# Patient Record
Sex: Male | Born: 1935 | Race: White | Hispanic: No | Smoking: Former smoker
Health system: Southern US, Community
[De-identification: ages and names within clinical notes are randomized; demographics above are authoritative.]

## PROBLEM LIST (undated history)

## (undated) DIAGNOSIS — K219 Gastro-esophageal reflux disease without esophagitis: Secondary | ICD-10-CM

## (undated) DIAGNOSIS — E119 Type 2 diabetes mellitus without complications: Secondary | ICD-10-CM

## (undated) DIAGNOSIS — J449 Chronic obstructive pulmonary disease, unspecified: Secondary | ICD-10-CM

## (undated) DIAGNOSIS — I10 Essential (primary) hypertension: Secondary | ICD-10-CM

## (undated) DIAGNOSIS — E785 Hyperlipidemia, unspecified: Secondary | ICD-10-CM

## (undated) DIAGNOSIS — I350 Nonrheumatic aortic (valve) stenosis: Secondary | ICD-10-CM

## (undated) HISTORY — PX: KNEE SURGERY: SHX244

## (undated) HISTORY — PX: HYDROCELE EXCISION / REPAIR: SUR1145

## (undated) HISTORY — PX: INGUINAL HERNIA REPAIR: SUR1180

---

## 2005-09-29 ENCOUNTER — Ambulatory Visit: Payer: Self-pay | Admitting: Family Medicine

## 2006-03-21 ENCOUNTER — Other Ambulatory Visit: Payer: Self-pay

## 2006-03-21 ENCOUNTER — Emergency Department: Payer: Self-pay | Admitting: General Practice

## 2006-11-01 IMAGING — CR DG CHEST 1V PORT
1 series · 1 of 1 positions shown · non-contrast
Comparison: none

REASON FOR EXAM: Chest pain
COMMENTS:

[view not recorded]
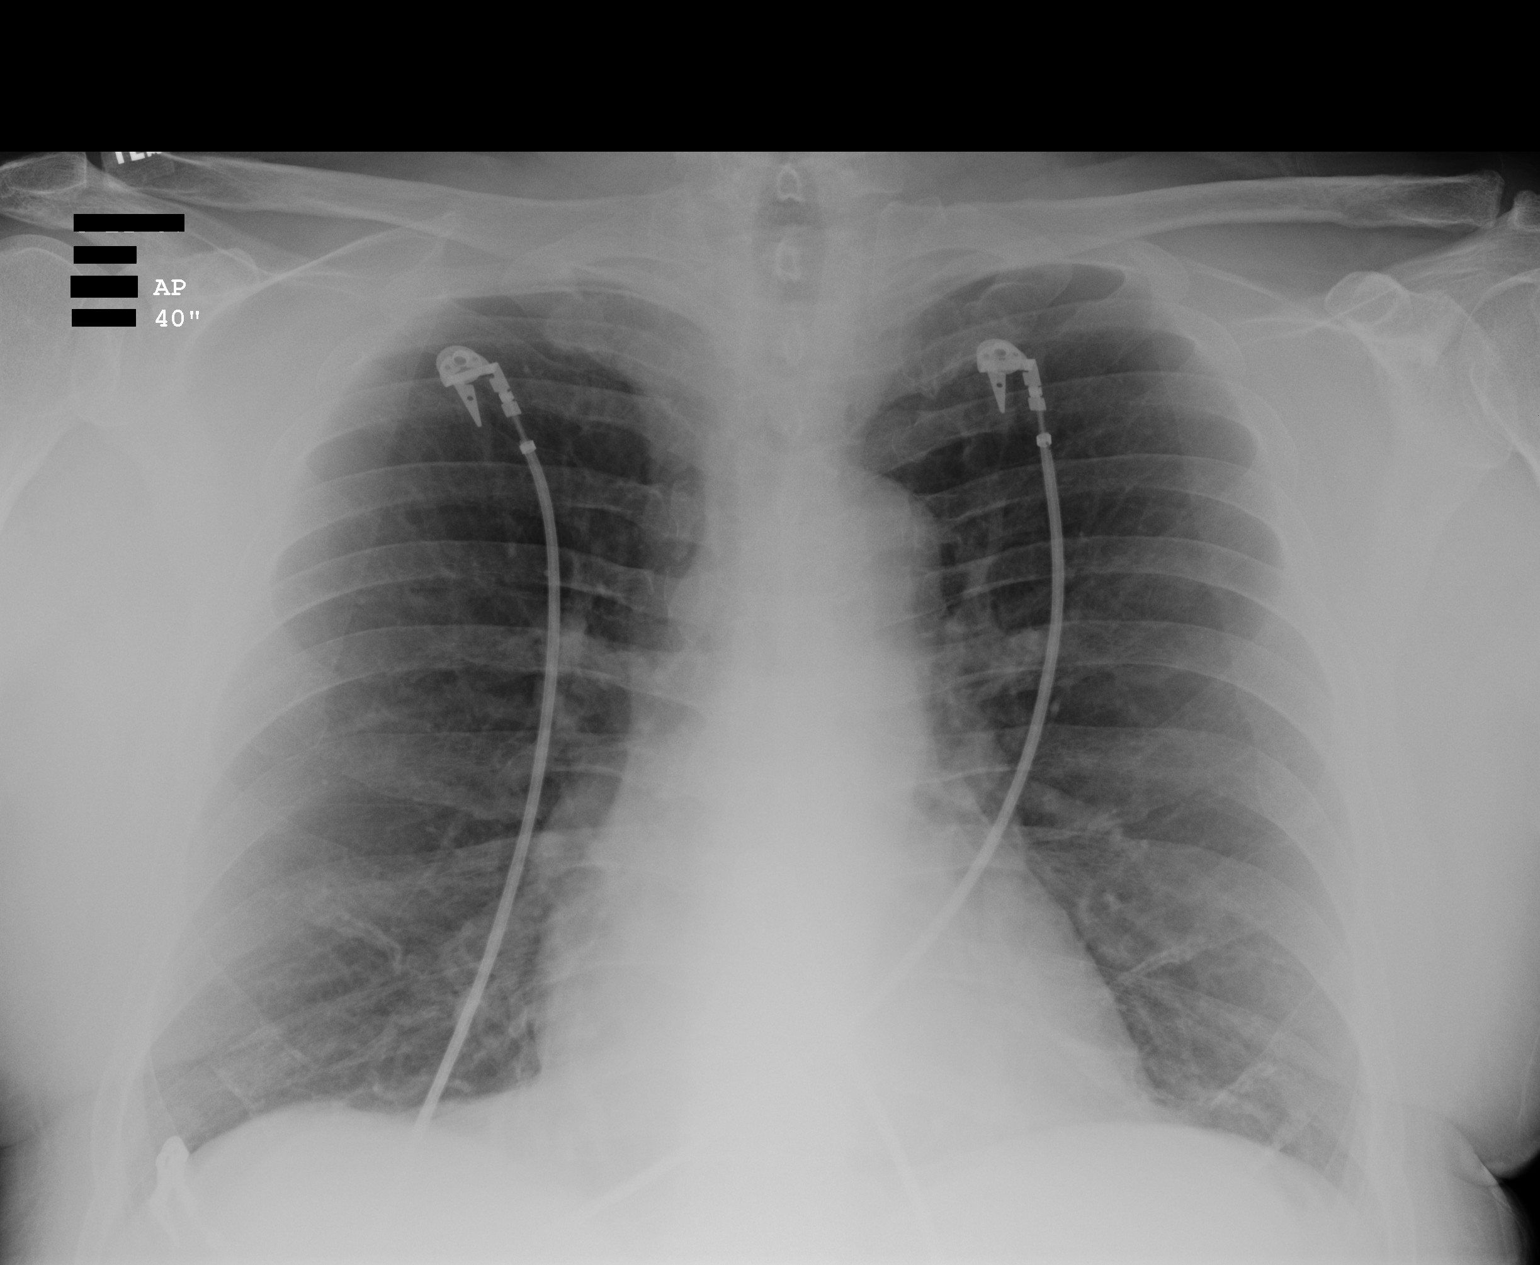

[1 of 1 positions shown; findings below may reference images not displayed]

PROCEDURE:     DXR - DXR PORTABLE CHEST SINGLE VIEW  - March 21, 2006  [DATE]

RESULT:          Comparison is made to a study 29 September, 2005.

The lungs are mildly hyperinflated.  There is no focal infiltrate.  The
heart is not enlarged.  The pulmonary vascularity is not engorged.  There is
no pleural effusion.
IMPRESSION: There is pulmonary hyperinflation.  I do not see
evidence of pneumonia.  A followup PA and lateral chest x-ray would be of
value.

## 2007-01-20 ENCOUNTER — Other Ambulatory Visit: Payer: Self-pay

## 2007-01-20 ENCOUNTER — Emergency Department: Payer: Self-pay | Admitting: Emergency Medicine

## 2007-12-23 DIAGNOSIS — N200 Calculus of kidney: Secondary | ICD-10-CM | POA: Insufficient documentation

## 2007-12-23 DIAGNOSIS — K219 Gastro-esophageal reflux disease without esophagitis: Secondary | ICD-10-CM | POA: Diagnosis present

## 2007-12-23 DIAGNOSIS — J449 Chronic obstructive pulmonary disease, unspecified: Secondary | ICD-10-CM | POA: Diagnosis present

## 2007-12-23 DIAGNOSIS — F32A Depression, unspecified: Secondary | ICD-10-CM | POA: Insufficient documentation

## 2007-12-23 DIAGNOSIS — Z86010 Personal history of colonic polyps: Secondary | ICD-10-CM | POA: Insufficient documentation

## 2007-12-23 DIAGNOSIS — H40009 Preglaucoma, unspecified, unspecified eye: Secondary | ICD-10-CM | POA: Insufficient documentation

## 2007-12-23 DIAGNOSIS — F419 Anxiety disorder, unspecified: Secondary | ICD-10-CM | POA: Insufficient documentation

## 2007-12-23 DIAGNOSIS — E119 Type 2 diabetes mellitus without complications: Secondary | ICD-10-CM

## 2007-12-23 DIAGNOSIS — N433 Hydrocele, unspecified: Secondary | ICD-10-CM | POA: Insufficient documentation

## 2007-12-23 DIAGNOSIS — E785 Hyperlipidemia, unspecified: Secondary | ICD-10-CM | POA: Diagnosis present

## 2008-01-28 DIAGNOSIS — D492 Neoplasm of unspecified behavior of bone, soft tissue, and skin: Secondary | ICD-10-CM | POA: Insufficient documentation

## 2009-02-07 DIAGNOSIS — M25519 Pain in unspecified shoulder: Secondary | ICD-10-CM | POA: Insufficient documentation

## 2009-02-07 DIAGNOSIS — M6020 Foreign body granuloma of soft tissue, not elsewhere classified, unspecified site: Secondary | ICD-10-CM | POA: Insufficient documentation

## 2010-11-14 DIAGNOSIS — M353 Polymyalgia rheumatica: Secondary | ICD-10-CM | POA: Insufficient documentation

## 2010-11-14 DIAGNOSIS — H259 Unspecified age-related cataract: Secondary | ICD-10-CM | POA: Insufficient documentation

## 2011-02-19 DIAGNOSIS — R490 Dysphonia: Secondary | ICD-10-CM | POA: Insufficient documentation

## 2011-12-09 ENCOUNTER — Emergency Department: Payer: Self-pay | Admitting: *Deleted

## 2011-12-09 LAB — COMPREHENSIVE METABOLIC PANEL
Albumin: 4.2 g/dL (ref 3.4–5.0)
Alkaline Phosphatase: 61 U/L (ref 50–136)
Anion Gap: 8 (ref 7–16)
BUN: 20 mg/dL — ABNORMAL HIGH (ref 7–18)
Calcium, Total: 10 mg/dL (ref 8.5–10.1)
Co2: 33 mmol/L — ABNORMAL HIGH (ref 21–32)
EGFR (African American): >60
EGFR (Non-African Amer.): >60
Glucose: 248 mg/dL — ABNORMAL HIGH (ref 65–99)
Osmolality: 290 (ref 275–301)
Potassium: 4.5 mmol/L (ref 3.5–5.1)
SGOT(AST): 32 U/L (ref 15–37)
SGPT (ALT): 33 U/L
Sodium: 140 mmol/L (ref 136–145)
Total Protein: 7.4 g/dL (ref 6.4–8.2)

## 2011-12-09 LAB — URINALYSIS, COMPLETE
Blood: NEGATIVE
Glucose,UR: >=500 mg/dL (ref 0–75)
Ketone: NEGATIVE
Leukocyte Esterase: NEGATIVE
Nitrite: NEGATIVE
Ph: 7 (ref 4.5–8.0)
RBC,UR: 1 /HPF (ref 0–5)

## 2011-12-09 LAB — CK TOTAL AND CKMB (NOT AT ARMC)
CK, Total: 81 U/L (ref 35–232)
CK-MB: 2.6 ng/mL (ref 0.5–3.6)

## 2011-12-09 LAB — CBC
HCT: 41.1 % (ref 40.0–52.0)
Platelet: 180 10*3/uL (ref 150–440)
RDW: 13.1 % (ref 11.5–14.5)
WBC: 7.9 10*3/uL (ref 3.8–10.6)

## 2011-12-12 ENCOUNTER — Inpatient Hospital Stay: Payer: Self-pay | Admitting: Surgery

## 2011-12-12 LAB — CBC WITH DIFFERENTIAL/PLATELET
Basophil #: 0 10*3/uL (ref 0.0–0.1)
HCT: 38.5 % — ABNORMAL LOW (ref 40.0–52.0)
Lymphocyte #: 1.6 10*3/uL (ref 1.0–3.6)
MCV: 90 fL (ref 80–100)
Monocyte #: 0.4 10*3/uL (ref 0.0–0.7)
Monocyte %: 7.3 %
Neutrophil #: 3.6 10*3/uL (ref 1.4–6.5)
Neutrophil %: 63.3 %
RDW: 13.1 % (ref 11.5–14.5)
WBC: 5.7 10*3/uL (ref 3.8–10.6)

## 2011-12-12 LAB — BASIC METABOLIC PANEL
Anion Gap: 8 (ref 7–16)
BUN: 17 mg/dL (ref 7–18)
Calcium, Total: 9.7 mg/dL (ref 8.5–10.1)
EGFR (Non-African Amer.): >60
Glucose: 134 mg/dL — ABNORMAL HIGH (ref 65–99)
Osmolality: 283 (ref 275–301)

## 2011-12-17 LAB — PATHOLOGY REPORT

## 2012-03-31 DIAGNOSIS — Z87891 Personal history of nicotine dependence: Secondary | ICD-10-CM | POA: Insufficient documentation

## 2012-03-31 DIAGNOSIS — I10 Essential (primary) hypertension: Secondary | ICD-10-CM | POA: Diagnosis present

## 2012-12-21 DIAGNOSIS — J209 Acute bronchitis, unspecified: Secondary | ICD-10-CM | POA: Insufficient documentation

## 2013-06-09 DIAGNOSIS — Z0000 Encounter for general adult medical examination without abnormal findings: Secondary | ICD-10-CM | POA: Insufficient documentation

## 2013-10-06 DIAGNOSIS — E1142 Type 2 diabetes mellitus with diabetic polyneuropathy: Secondary | ICD-10-CM | POA: Insufficient documentation

## 2014-06-22 DIAGNOSIS — N50819 Testicular pain, unspecified: Secondary | ICD-10-CM | POA: Insufficient documentation

## 2014-06-22 DIAGNOSIS — F17200 Nicotine dependence, unspecified, uncomplicated: Secondary | ICD-10-CM | POA: Insufficient documentation

## 2014-12-11 DIAGNOSIS — R350 Frequency of micturition: Secondary | ICD-10-CM | POA: Insufficient documentation

## 2015-02-14 DIAGNOSIS — M79606 Pain in leg, unspecified: Secondary | ICD-10-CM | POA: Insufficient documentation

## 2015-03-25 NOTE — Discharge Summary (Signed)
PATIENT NAME:  Francisco Gibbs, Francisco Gibbs MR#:  [id number] DATE OF BIRTH:  Nov 18, 1933  DATE OF ADMISSION:  12/12/2011 DATE OF DISCHARGE:  12/15/2011  FINAL DIAGNOSES:  1. Incarcerated right inguinal hernia.  2. Chronic left hydrocele.   PRINCIPAL PROCEDURES:  1. Repair of right inguinal hernia.  2. Repair and resection of left hydrocele.   HOSPITAL COURSE: The patient was admitted directly from the office as he had an incarcerated right inguinal hernia. Dr. Leanora Cover, my associate, was able to reduce the hernia later that evening as it was backed up in the operating room schedule. Due to OR unavailability on January 12th, the patient was scheduled for surgery on the morning of the 13th at which point a primary inguinal hernia repair utilizing a Shouldice technique was used and a hydrocele in the left side was resected and repaired. Postoperatively the patient did very well and on postoperative day #1 he was deemed suitable and stable for discharge.   DISCHARGE MEDICATIONS:  1. Metformin 500 mg by mouth once a day. 2. Lipitor 20 mg by mouth once a day. 3. Quinapril 20 mg by mouth once a day. 4. Spiriva one inhaled dose per day. 5. Aspirin 81 mg by mouth once a day. 6. Advair Diskus 5/500 one puff b.i.d.  7. ProAir HFA once a day.  8. Omeprazole 20 mg delayed-release tablet 1 tablet by mouth once a day.  9. Norco 5/325 1 to 2 tabs p.o. q.4 to 6 hours p.r.n. pain.   FOLLOW-UP: Follow-up was scheduled on 12/25/2011 at 10:30 a.m. in the [facility] office. He is to call if any questions or concerns, fever greater than 101.   DISCHARGE INSTRUCTIONS:  1. No heavy lifting. 2. Do not drive.  3. No alcohol or driving while taking pain medications. 4. Call with any questions or concerns to the office.   ____________________________ Jeannette How. Marina Gravel, MD mab:drc D: 12/29/2011 21:45:45 ET T: 12/30/2011 11:11:06 ET JOB#: [id number]  cc: Elta Guadeloupe A. Marina Gravel, MD, <Dictator> Aanika Defoor A Karah Caruthers MD ELECTRONICALLY SIGNED 12/31/2011  8:30

## 2015-03-25 NOTE — Op Note (Signed)
PATIENT NAME:  Francisco Gibbs, Francisco Gibbs MR#:  [id number] DATE OF BIRTH:  1933-01-27  DATE OF PROCEDURE:  12/14/2011  PREOPERATIVE DIAGNOSES: Right inguinal hernia, left hydrocele.   POSTOPERATIVE DIAGNOSES: Right inguinal hernia, left hydrocele.  PROCEDURES PERFORMED:  1. Shouldice repair of right inguinal hernia.  2. Resection and repair of left hydrocele.   SURGEON: Breena Bevacqua A. Marina Gravel, M.D.   ASSISTANT: Surgical scrub technologist.   TYPE OF ANESTHESIA: General endotracheal - Dr. Phineas Douglas and associates.   DESCRIPTION OF PROCEDURE: With the patient in the supine position, general endotracheal anesthesia was induced. The right groin was sterilely clipped of hair and prepped and draped with Betadine, and the scrotum with Betadine. A time out procedure was observed. A curvilinear incision was fashioned in the right groin above the inguinal ligament with scalpel and carried through Scarpa's fascia with electrocautery. A small bridging vein was divided between clamps and ties of 2-0 Vicryl. The external oblique aponeurosis was then identified. It was incised along its fibers laterally and carried through the external ring. Spermatic cord was encircled with Penrose drain at the pubic tubercle and elevated. Dissection of the cord demonstrated a large indirect inguinal hernia sac which was doubly ligated at its base with 2-0 silk suture and additionally ligated with 0 silk suture. It was retracted back into the abdomen. A Shouldice repair was then performed. The floor of the inguinal canal was then opened. It was then closed back over to itself in multiple layers with 2-0 PDS in running fashion starting medially, extending laterally to the ring and then from lateral to medial again. The conjoined tendon was then reapproximated to the shelving edge of the inguinal ligament tightening the internal ring thus by doing this. Local anesthesia was infiltrated. Hemostasis appeared to be adequate. The external oblique aponeurosis was  then closed utilizing 2-0 Vicryl. Scarpa's fascia was reapproximated utilizing 2-0 Vicryl. Skin edges were reapproximated utilizing running 4-0 Vicryl subcuticular, benzoin, Steri-Strips, Telfa, and Tegaderm.   Attention was then turned to the left scrotum. A transversely oriented scrotal incision was fashioned with scalpel and carried through the skin and dartos muscle with electrocautery. The external spermatic fascia and peritoneal layer of tunica vaginalis was then opened. The entire testicle with hydrocele was then extruded from the scrotal sac. The hydrocele was drained of clear fluid and opened in a vertical fashion with electrocautery and scissors. The hydrocele sac was then resected back with electrocautery. The edges of the hydrocele sac were then tacked posteriorly to each other with a 2-0 chromic suture. The testes was then inserted back into the scrotal canal. Hemostasis appeared to be adequate. The dartos muscle and the cremasteric fibers were then reapproximated in three layers with 2-0 chromic, 3-0 chromic, and 4-0 chromic subcuticular. Dermabond was then applied. Fluffs and surgical scrotal support was then applied. The patient was then subsequently extubated and taken to the recovery room in stable and satisfactory condition by anesthesia. ____________________________ Jeannette How. Marina Gravel, MD mab:slb D: 12/14/2011 15:47:21 ET T: 12/14/2011 16:05:16 ET JOB#: [id number]  cc: Elta Guadeloupe A. Marina Gravel, MD, <Dictator> Gradie Ohm A Macy Polio MD ELECTRONICALLY SIGNED 12/19/2011 11:12

## 2015-05-02 DIAGNOSIS — J309 Allergic rhinitis, unspecified: Secondary | ICD-10-CM | POA: Insufficient documentation

## 2016-03-27 DIAGNOSIS — K5900 Constipation, unspecified: Secondary | ICD-10-CM | POA: Insufficient documentation

## 2016-08-19 DIAGNOSIS — R32 Unspecified urinary incontinence: Secondary | ICD-10-CM | POA: Insufficient documentation

## 2017-02-05 DIAGNOSIS — R600 Localized edema: Secondary | ICD-10-CM | POA: Insufficient documentation

## 2017-06-01 DIAGNOSIS — R634 Abnormal weight loss: Secondary | ICD-10-CM | POA: Insufficient documentation

## 2017-10-08 DIAGNOSIS — M545 Low back pain, unspecified: Secondary | ICD-10-CM | POA: Insufficient documentation

## 2017-10-14 ENCOUNTER — Other Ambulatory Visit: Payer: Self-pay | Admitting: Family Medicine

## 2017-10-14 DIAGNOSIS — R634 Abnormal weight loss: Secondary | ICD-10-CM

## 2017-10-14 DIAGNOSIS — F17200 Nicotine dependence, unspecified, uncomplicated: Secondary | ICD-10-CM

## 2017-10-20 ENCOUNTER — Ambulatory Visit: Payer: Medicare Other

## 2017-10-27 ENCOUNTER — Other Ambulatory Visit: Payer: Self-pay | Admitting: Family Medicine

## 2017-10-28 ENCOUNTER — Ambulatory Visit
Admission: RE | Admit: 2017-10-28 | Discharge: 2017-10-28 | Disposition: A | Discharge status: Home / Self Care | Payer: Medicare Other | Source: Ambulatory Visit | Attending: Family Medicine | Admitting: Family Medicine

## 2017-10-28 DIAGNOSIS — R634 Abnormal weight loss: Secondary | ICD-10-CM | POA: Insufficient documentation

## 2017-10-28 DIAGNOSIS — F17210 Nicotine dependence, cigarettes, uncomplicated: Secondary | ICD-10-CM | POA: Diagnosis present

## 2017-10-28 DIAGNOSIS — E041 Nontoxic single thyroid nodule: Secondary | ICD-10-CM | POA: Diagnosis not present

## 2017-10-28 DIAGNOSIS — F17200 Nicotine dependence, unspecified, uncomplicated: Secondary | ICD-10-CM

## 2017-10-28 DIAGNOSIS — J439 Emphysema, unspecified: Secondary | ICD-10-CM | POA: Insufficient documentation

## 2017-10-28 DIAGNOSIS — I700 Atherosclerosis of aorta: Secondary | ICD-10-CM | POA: Insufficient documentation

## 2017-10-28 HISTORY — DX: Type 2 diabetes mellitus without complications: E11.9

## 2017-10-28 HISTORY — DX: Essential (primary) hypertension: I10

## 2017-10-28 LAB — POCT I-STAT CREATININE: Creatinine, Ser: 0.9 mg/dL (ref 0.61–1.24)

## 2017-10-28 MED ORDER — IOPAMIDOL (ISOVUE-300) INJECTION 61%
75.0000 mL | Freq: Once | INTRAVENOUS | Status: AC | PRN
  Administered 2017-10-28: 75 mL via INTRAVENOUS

## 2017-11-02 DIAGNOSIS — E041 Nontoxic single thyroid nodule: Secondary | ICD-10-CM | POA: Insufficient documentation

## 2017-11-10 ENCOUNTER — Other Ambulatory Visit: Payer: Self-pay | Admitting: Family Medicine

## 2017-11-10 DIAGNOSIS — E041 Nontoxic single thyroid nodule: Secondary | ICD-10-CM

## 2017-11-19 ENCOUNTER — Ambulatory Visit
Admission: RE | Admit: 2017-11-19 | Discharge: 2017-11-19 | Disposition: A | Discharge status: Home / Self Care | Payer: Medicare Other | Source: Ambulatory Visit | Attending: Family Medicine | Admitting: Family Medicine

## 2017-11-19 DIAGNOSIS — E041 Nontoxic single thyroid nodule: Secondary | ICD-10-CM | POA: Insufficient documentation

## 2018-01-25 ENCOUNTER — Ambulatory Visit
Admission: RE | Admit: 2018-01-25 | Discharge: 2018-01-25 | Disposition: A | Discharge status: Home / Self Care | Payer: Medicare Other | Source: Ambulatory Visit | Attending: Nurse Practitioner | Admitting: Nurse Practitioner

## 2018-01-25 ENCOUNTER — Other Ambulatory Visit: Payer: Self-pay | Admitting: Nurse Practitioner

## 2018-01-25 DIAGNOSIS — I700 Atherosclerosis of aorta: Secondary | ICD-10-CM | POA: Diagnosis not present

## 2018-01-25 DIAGNOSIS — J441 Chronic obstructive pulmonary disease with (acute) exacerbation: Secondary | ICD-10-CM | POA: Diagnosis present

## 2018-01-25 DIAGNOSIS — J40 Bronchitis, not specified as acute or chronic: Secondary | ICD-10-CM | POA: Insufficient documentation

## 2018-01-25 DIAGNOSIS — J449 Chronic obstructive pulmonary disease, unspecified: Secondary | ICD-10-CM

## 2018-03-07 IMAGING — US US THYROID
1 series · 13 of 25 positions shown · non-contrast
Comparison: 10/28/2017

CLINICAL DATA: Incidental on CT.

EXAM:
THYROID ULTRASOUND
TECHNIQUE: Ultrasound examination of the thyroid gland and adjacent soft
tissues was performed.

[Series 1: us thyroid · 0.08mm/px · 13 of 76 slices shown]
[im 1/76]
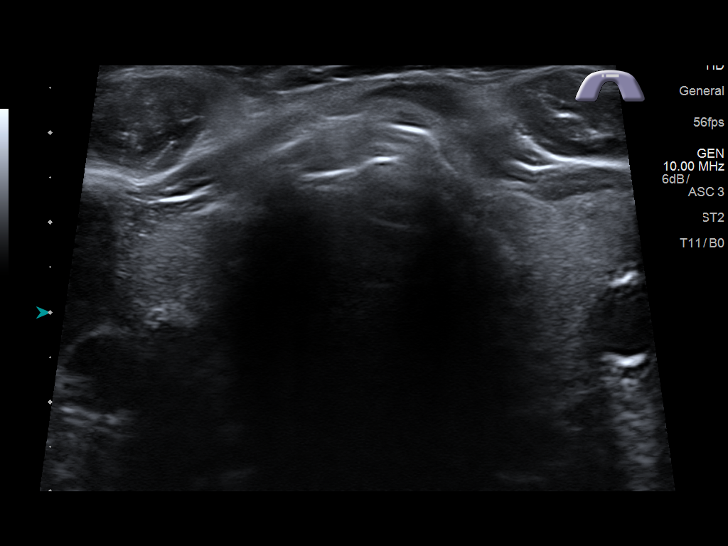
[im 7/76]
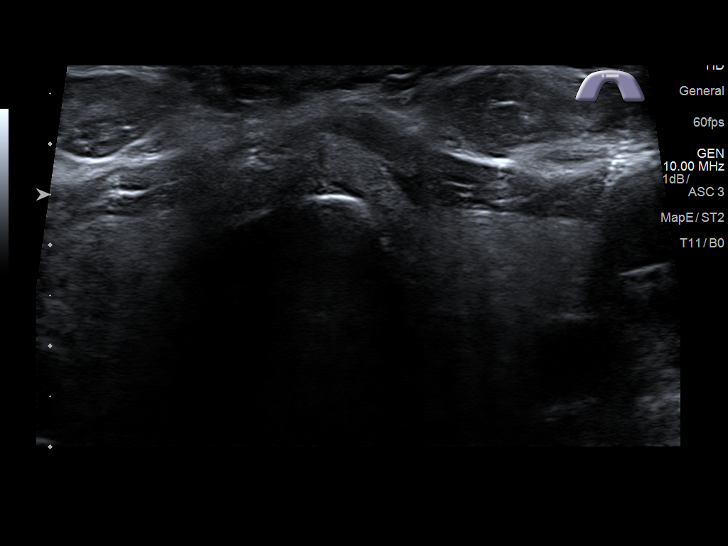
[im 13/76]
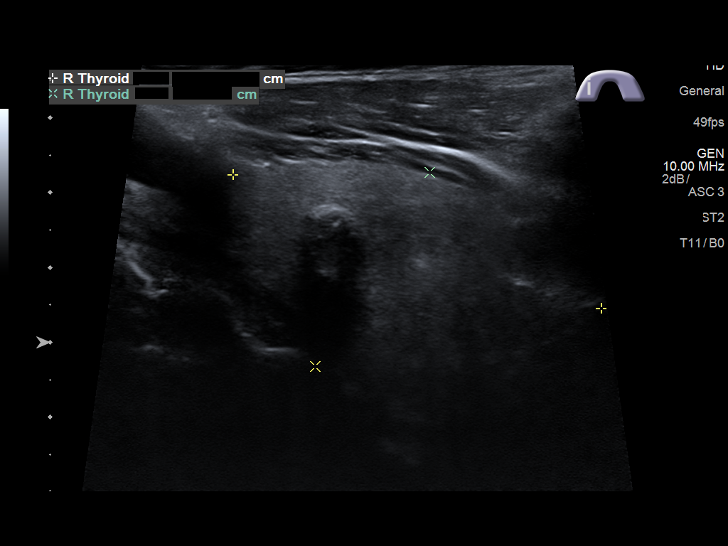
[im 19/76]
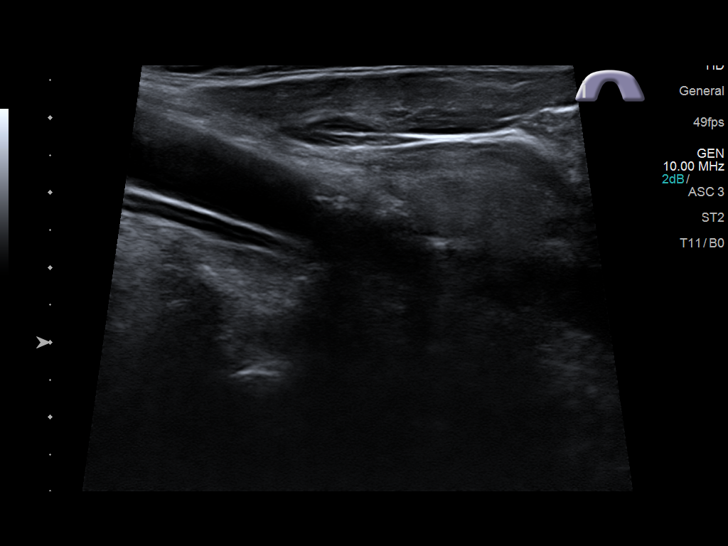
[im 26/76]
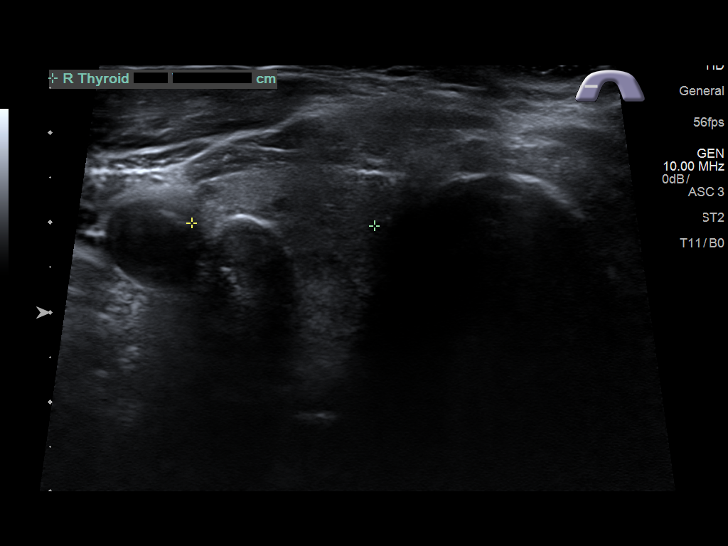
[im 32/76]
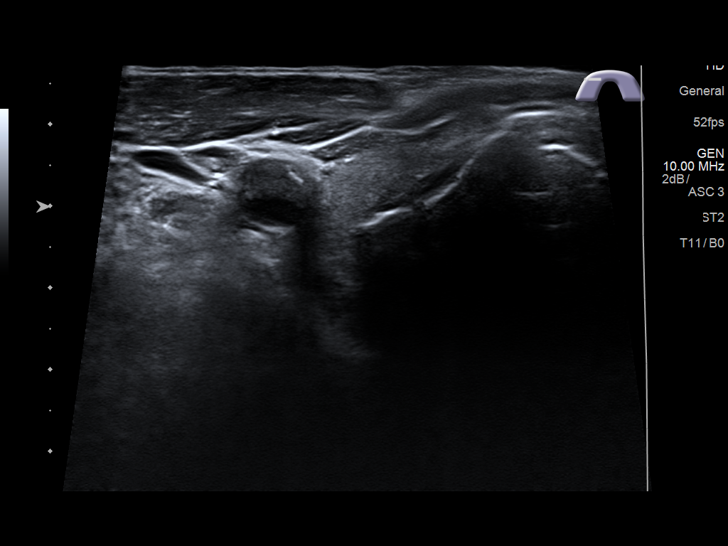
[im 38/76]
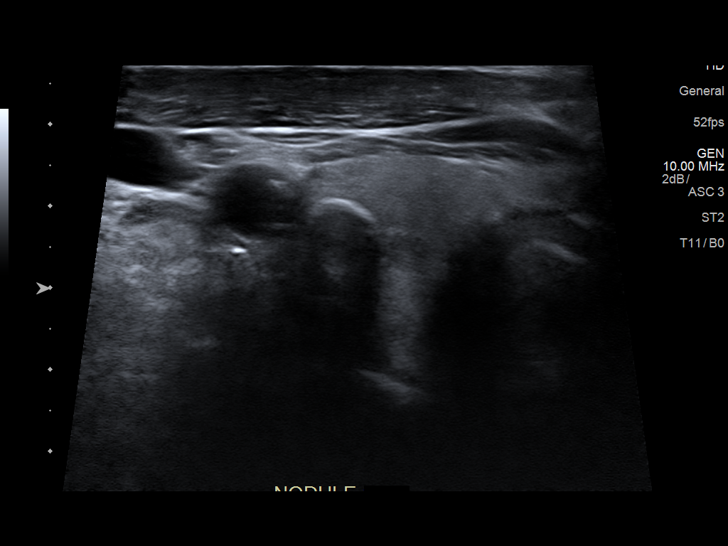
[im 44/76]
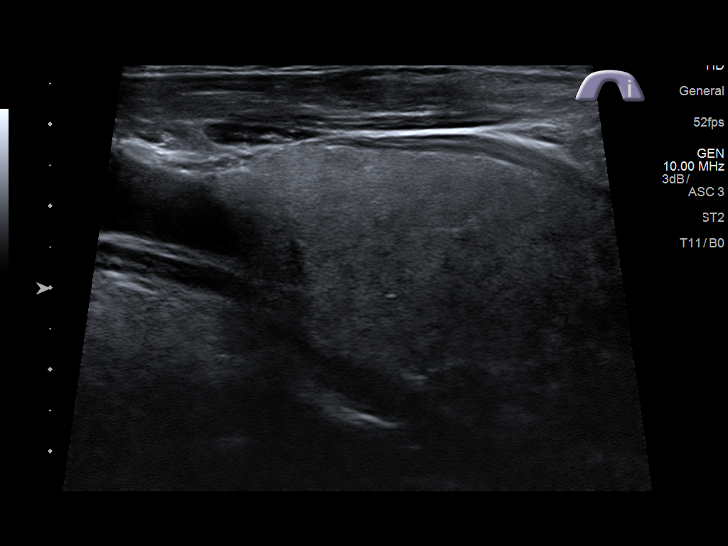
[im 51/76]
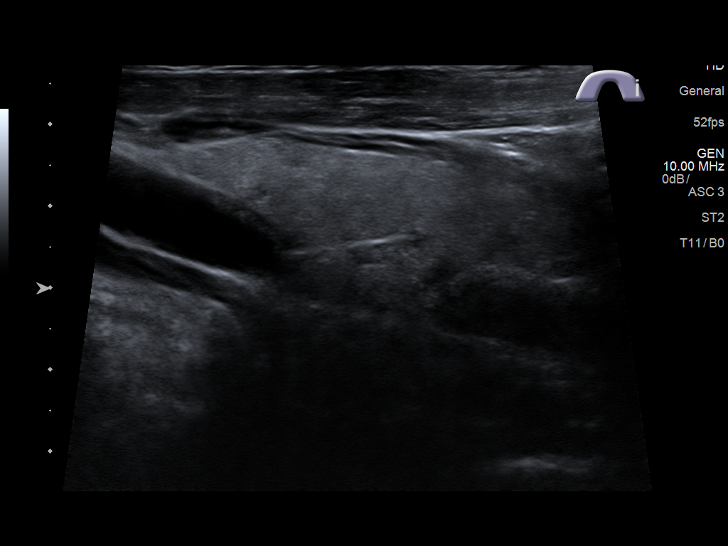
[im 57/76]
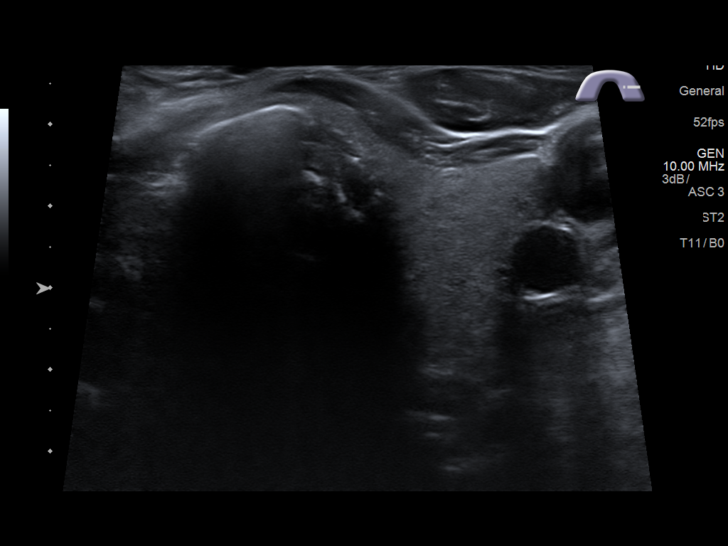
[im 63/76]
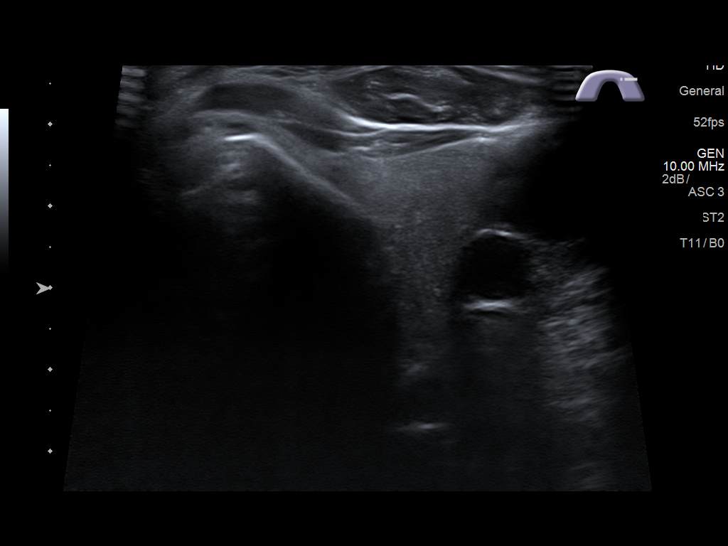
[im 69/76]
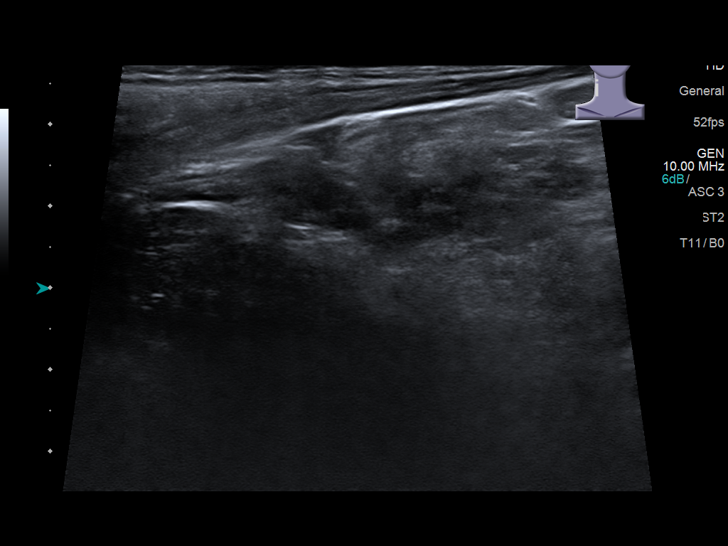
[im 76/76]
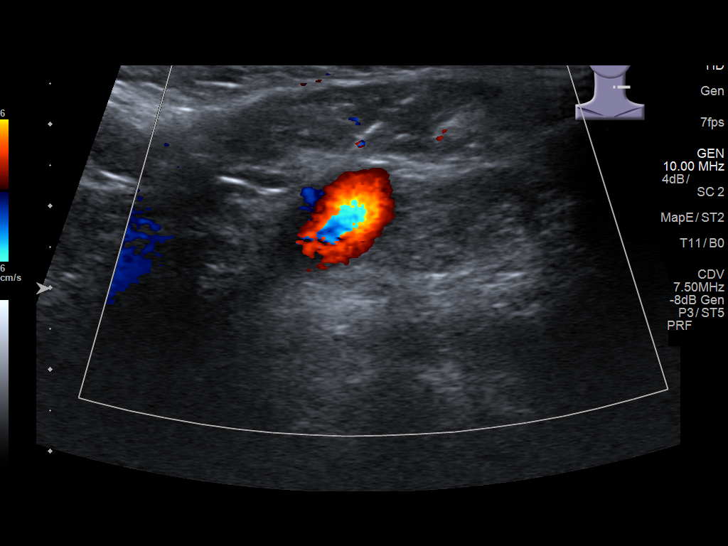

[13 of 25 positions shown; findings below may reference images not displayed]

FINDINGS: Parenchymal Echotexture: Mildly heterogenous

Isthmus: 5 mm

Right lobe: 5.2 x 3.0 x 2.0 cm

Left lobe: 4.7 x 2.4 x 1.7 cm

_________________________________________________________

Estimated total number of nodules >/= 1 cm: 1

Number of spongiform nodules >/=  2 cm not described below (TR1): 0

Number of mixed cystic and solid nodules >/= 1.5 cm not described
below (TR2): 0

_________________________________________________________

Nodule # 1:

Location: Right; Inferior

Maximum size: 1.2 cm; Other 2 dimensions: 1.0 x 1.0 cm

Composition: solid/almost completely solid (2)

Echogenicity: isoechoic (1)

Shape: not taller-than-wide (0)

Margins: ill-defined (0)

Echogenic foci: peripheral calcifications (2)

ACR TI-RADS total points: 5.

ACR TI-RADS risk category: TR4 (4-6 points).

ACR TI-RADS recommendations:

*Given size (>/= 1 - 1.4 cm) and appearance, a follow-up ultrasound
in 1 year should be considered based on TI-RADS criteria.

_________________________________________________________
IMPRESSION: 1.2 cm right inferior TR 4 nodule correlates with the CT finding.
This meets criteria for follow-up in 1 year.

The above is in keeping with the ACR TI-RADS recommendations - [HOSPITAL] 4297;[DATE].

## 2018-03-18 DIAGNOSIS — R601 Generalized edema: Secondary | ICD-10-CM | POA: Insufficient documentation

## 2018-04-28 ENCOUNTER — Other Ambulatory Visit: Payer: Self-pay

## 2018-04-28 ENCOUNTER — Encounter: Payer: Self-pay | Admitting: Emergency Medicine

## 2018-04-28 ENCOUNTER — Inpatient Hospital Stay
Admission: EM | Admit: 2018-04-28 | Discharge: 2018-04-29 | DRG: 641 | Disposition: A | Discharge status: Home / Self Care | Payer: Medicare Other | Attending: Internal Medicine | Admitting: Internal Medicine

## 2018-04-28 DIAGNOSIS — F419 Anxiety disorder, unspecified: Secondary | ICD-10-CM | POA: Diagnosis not present

## 2018-04-28 DIAGNOSIS — I441 Atrioventricular block, second degree: Secondary | ICD-10-CM | POA: Diagnosis not present

## 2018-04-28 DIAGNOSIS — I10 Essential (primary) hypertension: Secondary | ICD-10-CM | POA: Diagnosis not present

## 2018-04-28 DIAGNOSIS — F17210 Nicotine dependence, cigarettes, uncomplicated: Secondary | ICD-10-CM | POA: Diagnosis not present

## 2018-04-28 DIAGNOSIS — Y92009 Unspecified place in unspecified non-institutional (private) residence as the place of occurrence of the external cause: Secondary | ICD-10-CM

## 2018-04-28 DIAGNOSIS — R001 Bradycardia, unspecified: Secondary | ICD-10-CM | POA: Diagnosis present

## 2018-04-28 DIAGNOSIS — J449 Chronic obstructive pulmonary disease, unspecified: Secondary | ICD-10-CM | POA: Diagnosis not present

## 2018-04-28 DIAGNOSIS — R600 Localized edema: Secondary | ICD-10-CM | POA: Diagnosis present

## 2018-04-28 DIAGNOSIS — T502X5A Adverse effect of carbonic-anhydrase inhibitors, benzothiadiazides and other diuretics, initial encounter: Secondary | ICD-10-CM | POA: Diagnosis present

## 2018-04-28 DIAGNOSIS — I4891 Unspecified atrial fibrillation: Secondary | ICD-10-CM | POA: Diagnosis present

## 2018-04-28 DIAGNOSIS — E119 Type 2 diabetes mellitus without complications: Secondary | ICD-10-CM | POA: Diagnosis not present

## 2018-04-28 LAB — CBC WITH DIFFERENTIAL/PLATELET
BASOS ABS: 0 10*3/uL (ref 0–0.1)
BASOS PCT: 1 %
EOS ABS: 0.1 10*3/uL (ref 0–0.7)
EOS PCT: 1 %
HCT: 32 % — ABNORMAL LOW (ref 40.0–52.0)
Hemoglobin: 10.6 g/dL — ABNORMAL LOW (ref 13.0–18.0)
Lymphocytes Relative: 36 %
Lymphs Abs: 1.9 10*3/uL (ref 1.0–3.6)
MCH: 29.5 pg (ref 26.0–34.0)
MCHC: 33.2 g/dL (ref 32.0–36.0)
MCV: 88.9 fL (ref 80.0–100.0)
MONO ABS: 0.5 10*3/uL (ref 0.2–1.0)
Monocytes Relative: 10 %
Neutro Abs: 2.7 10*3/uL (ref 1.4–6.5)
Neutrophils Relative %: 52 %
PLATELETS: 134 10*3/uL — AB (ref 150–440)
RBC: 3.6 MIL/uL — ABNORMAL LOW (ref 4.40–5.90)
RDW: 14.2 % (ref 11.5–14.5)
WBC: 5.3 10*3/uL (ref 3.8–10.6)

## 2018-04-28 LAB — URINALYSIS, COMPLETE (UACMP) WITH MICROSCOPIC
BILIRUBIN URINE: NEGATIVE
GLUCOSE, UA: NEGATIVE mg/dL
KETONES UR: NEGATIVE mg/dL
LEUKOCYTES UA: NEGATIVE
NITRITE: NEGATIVE
PH: 5 (ref 5.0–8.0)
Protein, ur: NEGATIVE mg/dL
SPECIFIC GRAVITY, URINE: 1.006 (ref 1.005–1.030)
Squamous Epithelial / LPF: NONE SEEN (ref 0–5)
WBC, UA: NONE SEEN WBC/hpf (ref 0–5)

## 2018-04-28 LAB — COMPREHENSIVE METABOLIC PANEL
ALBUMIN: 4 g/dL (ref 3.5–5.0)
ALK PHOS: 52 U/L (ref 38–126)
ALT: 19 U/L (ref 17–63)
AST: 29 U/L (ref 15–41)
Anion gap: 9 (ref 5–15)
BILIRUBIN TOTAL: 0.5 mg/dL (ref 0.3–1.2)
BUN: 23 mg/dL — ABNORMAL HIGH (ref 6–20)
CALCIUM: 7.4 mg/dL — AB (ref 8.9–10.3)
CO2: 26 mmol/L (ref 22–32)
CREATININE: 1.05 mg/dL (ref 0.61–1.24)
Chloride: 104 mmol/L (ref 101–111)
GFR calc Af Amer: >60 mL/min (ref >60)
GFR calc non Af Amer: >60 mL/min (ref >60)
GLUCOSE: 116 mg/dL — AB (ref 65–99)
Potassium: 3.9 mmol/L (ref 3.5–5.1)
Sodium: 139 mmol/L (ref 135–145)
TOTAL PROTEIN: 6.5 g/dL (ref 6.5–8.1)

## 2018-04-28 LAB — GLUCOSE, CAPILLARY: Glucose-Capillary: 116 mg/dL — ABNORMAL HIGH (ref 65–99)

## 2018-04-28 LAB — MAGNESIUM: Magnesium: 0.4 mg/dL — CL (ref 1.7–2.4)

## 2018-04-28 MED ORDER — ONDANSETRON HCL 4 MG PO TABS: 4.0000 mg | ORAL_TABLET | Freq: Four times a day (QID) | ORAL | Status: DC | PRN

## 2018-04-28 MED ORDER — QUINAPRIL HCL 10 MG PO TABS
40.0000 mg | ORAL_TABLET | Freq: Every day | ORAL | Status: DC
  Administered 2018-04-29: 40 mg via ORAL
  Filled 2018-04-28 (×2): qty 4

## 2018-04-28 MED ORDER — SODIUM CHLORIDE 0.9% FLUSH
3.0000 mL | Freq: Two times a day (BID) | INTRAVENOUS | Status: DC
  Administered 2018-04-28: 3 mL via INTRAVENOUS

## 2018-04-28 MED ORDER — TERAZOSIN HCL 1 MG PO CAPS
1.0000 mg | ORAL_CAPSULE | Freq: Every day | ORAL | Status: DC
  Administered 2018-04-28 – 2018-04-29 (×2): 1 mg via ORAL
  Filled 2018-04-28 (×5): qty 1

## 2018-04-28 MED ORDER — TIOTROPIUM BROMIDE MONOHYDRATE 18 MCG IN CAPS
1.0000 | ORAL_CAPSULE | Freq: Every day | RESPIRATORY_TRACT | Status: DC
  Administered 2018-04-28 – 2018-04-29 (×2): 18 ug via RESPIRATORY_TRACT
  Filled 2018-04-28: qty 5

## 2018-04-28 MED ORDER — MAGNESIUM SULFATE 2 GM/50ML IV SOLN
2.0000 g | Freq: Once | INTRAVENOUS | Status: AC
  Administered 2018-04-28: 2 g via INTRAVENOUS
  Filled 2018-04-28: qty 50

## 2018-04-28 MED ORDER — DOCUSATE SODIUM 100 MG PO CAPS
100.0000 mg | ORAL_CAPSULE | Freq: Two times a day (BID) | ORAL | Status: DC
Start: 2018-04-28 — End: 2018-04-29
  Filled 2018-04-28 (×2): qty 1

## 2018-04-28 MED ORDER — LOSARTAN POTASSIUM 50 MG PO TABS
50.0000 mg | ORAL_TABLET | Freq: Every day | ORAL | Status: DC
  Administered 2018-04-29: 50 mg via ORAL
  Filled 2018-04-28: qty 1

## 2018-04-28 MED ORDER — MAGNESIUM CHLORIDE 64 MG PO TBEC
2.0000 | DELAYED_RELEASE_TABLET | Freq: Every day | ORAL | Status: DC
  Administered 2018-04-28: 128 mg via ORAL
  Filled 2018-04-28 (×2): qty 2

## 2018-04-28 MED ORDER — ONDANSETRON HCL 4 MG/2ML IJ SOLN: 4.0000 mg | Freq: Four times a day (QID) | INTRAMUSCULAR | Status: DC | PRN

## 2018-04-28 MED ORDER — CLORAZEPATE DIPOTASSIUM 7.5 MG PO TABS
7.5000 mg | ORAL_TABLET | Freq: Every day | ORAL | Status: DC
  Administered 2018-04-29: 7.5 mg via ORAL
  Filled 2018-04-28 (×2): qty 1

## 2018-04-28 MED ORDER — ATORVASTATIN CALCIUM 20 MG PO TABS
80.0000 mg | ORAL_TABLET | Freq: Every day | ORAL | Status: DC
  Administered 2018-04-28 – 2018-04-29 (×2): 80 mg via ORAL
  Filled 2018-04-28 (×2): qty 4

## 2018-04-28 MED ORDER — MAGNESIUM SULFATE 2 GM/50ML IV SOLN: 2.0000 g | Freq: Once | INTRAVENOUS | Status: DC

## 2018-04-28 MED ORDER — ACETAMINOPHEN 650 MG RE SUPP: 650.0000 mg | Freq: Four times a day (QID) | RECTAL | Status: DC | PRN

## 2018-04-28 MED ORDER — MAGNESIUM OXIDE 400 (241.3 MG) MG PO TABS
200.0000 mg | ORAL_TABLET | Freq: Every day | ORAL | Status: DC
  Administered 2018-04-28: 200 mg via ORAL
  Filled 2018-04-28 (×2): qty 1

## 2018-04-28 MED ORDER — FLUTICASONE PROPIONATE 50 MCG/ACT NA SUSP
1.0000 | Freq: Every day | NASAL | Status: DC | PRN
  Filled 2018-04-28: qty 16

## 2018-04-28 MED ORDER — GABAPENTIN 300 MG PO CAPS
300.0000 mg | ORAL_CAPSULE | Freq: Three times a day (TID) | ORAL | Status: DC
  Administered 2018-04-28 – 2018-04-29 (×3): 300 mg via ORAL
  Filled 2018-04-28 (×3): qty 1

## 2018-04-28 MED ORDER — ENOXAPARIN SODIUM 40 MG/0.4ML ~~LOC~~ SOLN
40.0000 mg | SUBCUTANEOUS | Status: DC
  Administered 2018-04-28: 40 mg via SUBCUTANEOUS
  Filled 2018-04-28: qty 0.4

## 2018-04-28 MED ORDER — FUROSEMIDE 40 MG PO TABS
40.0000 mg | ORAL_TABLET | Freq: Every day | ORAL | Status: DC
  Administered 2018-04-29: 40 mg via ORAL
  Filled 2018-04-28: qty 1

## 2018-04-28 MED ORDER — ACETAMINOPHEN 325 MG PO TABS: 650.0000 mg | ORAL_TABLET | Freq: Four times a day (QID) | ORAL | Status: DC | PRN

## 2018-04-28 MED ORDER — INSULIN ASPART 100 UNIT/ML ~~LOC~~ SOLN
0.0000 [IU] | Freq: Three times a day (TID) | SUBCUTANEOUS | Status: DC
  Administered 2018-04-29: 1 [IU] via SUBCUTANEOUS
  Filled 2018-04-28: qty 1

## 2018-04-28 MED ORDER — ASPIRIN 81 MG PO CHEW
81.0000 mg | CHEWABLE_TABLET | Freq: Every day | ORAL | Status: DC
  Administered 2018-04-29: 81 mg via ORAL
  Filled 2018-04-28: qty 1

## 2018-04-28 MED ORDER — METFORMIN HCL ER 500 MG PO TB24
1000.0000 mg | ORAL_TABLET | Freq: Two times a day (BID) | ORAL | Status: DC
  Administered 2018-04-28 – 2018-04-29 (×2): 1000 mg via ORAL
  Filled 2018-04-28 (×3): qty 2

## 2018-04-28 MED ORDER — PANTOPRAZOLE SODIUM 40 MG PO TBEC
40.0000 mg | DELAYED_RELEASE_TABLET | Freq: Every day | ORAL | Status: DC
  Administered 2018-04-29: 40 mg via ORAL
  Filled 2018-04-28: qty 1

## 2018-04-28 MED ORDER — BISACODYL 5 MG PO TBEC: 5.0000 mg | DELAYED_RELEASE_TABLET | Freq: Every day | ORAL | Status: DC | PRN

## 2018-04-28 NOTE — ED Notes (Signed)
Attempted to call report per Secretary they will call back shortly

## 2018-04-28 NOTE — Progress Notes (Signed)
Still waiting for one more medication from pharmacy. Will relay to night nurse to give once they tube it up. Have already called pharmacy for it. Will continue to monitor.

## 2018-04-28 NOTE — ED Notes (Signed)
Pt given meal tray ok per MD Quentin Cornwall

## 2018-04-28 NOTE — ED Provider Notes (Signed)
[facility] Emergency Department Provider Note    First MD Initiated Contact with Patient 04/28/18 1209     (approximate)  I have reviewed the triage vital signs and the nursing notes.   HISTORY  Chief Complaint Abnormal Lab    HPI Francisco Gibbs is a 84 y.o. male presents to the ER for abnormal magnesium and calcium levels.  Patient has been taking oral magnesium supplements for the past several weeks without improvement.  Has been also having frequent nonbloody non-melanotic stools that are worsened by the magnesium supplementation.  He does take Lasix as well as Protonix but is otherwise stating that he has been asymptomatic.  Denies any anorexia.  No nausea or vomiting.  No change in behavior.  Past Medical History:  Diagnosis Date  . Diabetes mellitus without complication ([place])    Pt takes Metformin.  Marland Kitchen Hypertension    No family history on file. Past Surgical History:  Procedure Laterality Date  . HYDROCELE EXCISION / REPAIR    . INGUINAL HERNIA REPAIR    . KNEE SURGERY     There are no active problems to display for this patient.     Prior to Admission medications   Not on File    Allergies Patient has no known allergies.    Social History Social History   Tobacco Use  . Smoking status: Former Research scientist (life sciences)  . Smokeless tobacco: Never Used  Substance Use Topics  . Alcohol use: Not Currently  . Drug use: Never    Review of Systems Patient denies headaches, rhinorrhea, blurry vision, numbness, shortness of breath, chest pain, edema, cough, abdominal pain, nausea, vomiting, diarrhea, dysuria, fevers, rashes or hallucinations unless otherwise stated above in HPI. ____________________________________________   PHYSICAL EXAM:  VITAL SIGNS: Vitals:   04/28/18 1245 04/28/18 1330  BP:    Pulse: (!) 26 (!) 36  Resp:  17  Temp:    SpO2: 96% 97%    Constitutional: Alert and oriented. Well appearing and in no acute distress. Eyes:  Conjunctivae are normal.  Head: Atraumatic. Nose: No congestion/rhinnorhea. Mouth/Throat: Mucous membranes are moist.   Neck: No stridor. Painless ROM.  Cardiovascular: Normal rate, regular rhythm. Grossly normal heart sounds.  Good peripheral circulation. Respiratory: Normal respiratory effort.  No retractions. Lungs CTAB. Gastrointestinal: Soft and nontender. No distention. No abdominal bruits. No CVA tenderness. Genitourinary:  Musculoskeletal: No lower extremity tenderness nor edema.  No joint effusions. Neurologic:  Normal speech and language. No gross focal neurologic deficits are appreciated. No facial droop.  No chovsteks sign, no hyperreflexia Skin:  Skin is warm, dry and intact. No rash noted. Psychiatric: Mood and affect are normal. Speech and behavior are normal.  ____________________________________________   LABS (all labs ordered are listed, but only abnormal results are displayed)  Results for orders placed or performed during the hospital encounter of 04/28/18 (from the past 24 hour(s))  Comprehensive metabolic panel     Status: Abnormal   Collection Time: 04/28/18 10:51 AM  Result Value Ref Range   Sodium 139 135 - 145 mmol/L   Potassium 3.9 3.5 - 5.1 mmol/L   Chloride 104 101 - 111 mmol/L   CO2 26 22 - 32 mmol/L   Glucose, Bld 116 (H) 65 - 99 mg/dL   BUN 23 (H) 6 - 20 mg/dL   Creatinine, Ser 1.05 0.61 - 1.24 mg/dL   Calcium 7.4 (L) 8.9 - 10.3 mg/dL   Total Protein 6.5 6.5 - 8.1 g/dL   Albumin  4.0 3.5 - 5.0 g/dL   AST 29 15 - 41 U/L   ALT 19 17 - 63 U/L   Alkaline Phosphatase 52 38 - 126 U/L   Total Bilirubin 0.5 0.3 - 1.2 mg/dL   GFR calc non Af Amer >60 >60 mL/min   GFR calc Af Amer >60 >60 mL/min   Anion gap 9 5 - 15  CBC with Differential     Status: Abnormal   Collection Time: 04/28/18 10:51 AM  Result Value Ref Range   WBC 5.3 3.8 - 10.6 K/uL   RBC 3.60 (L) 4.40 - 5.90 MIL/uL   Hemoglobin 10.6 (L) 13.0 - 18.0 g/dL   HCT 32.0 (L) 40.0 - 52.0 %    MCV 88.9 80.0 - 100.0 fL   MCH 29.5 26.0 - 34.0 pg   MCHC 33.2 32.0 - 36.0 g/dL   RDW 14.2 11.5 - 14.5 %   Platelets 134 (L) 150 - 440 K/uL   Neutrophils Relative % 52 %   Neutro Abs 2.7 1.4 - 6.5 K/uL   Lymphocytes Relative 36 %   Lymphs Abs 1.9 1.0 - 3.6 K/uL   Monocytes Relative 10 %   Monocytes Absolute 0.5 0.2 - 1.0 K/uL   Eosinophils Relative 1 %   Eosinophils Absolute 0.1 0 - 0.7 K/uL   Basophils Relative 1 %   Basophils Absolute 0.0 0 - 0.1 K/uL  Magnesium     Status: Abnormal   Collection Time: 04/28/18 12:09 PM  Result Value Ref Range   Magnesium 0.4 (LL) 1.7 - 2.4 mg/dL   ____________________________________________  EKG My review and personal interpretation at Time: 13:15 Indication: bradycardia  Rate: 60  Rhythm: sinus dysrhythmia versus mobitz type 1 Axis: normal Other: no stemi, nonspecific st abn ____________________________________________  RADIOLOGY   ____________________________________________   PROCEDURES  Procedure(s) performed:  .Critical Care Performed by: Merlyn Lot, MD Authorized by: Merlyn Lot, MD   Critical care provider statement:    Critical care time (minutes):  15   Critical care time was exclusive of:  Separately billable procedures and treating other patients   Critical care was necessary to treat or prevent imminent or life-threatening deterioration of the following conditions:  Metabolic crisis   Critical care was time spent personally by me on the following activities:  Development of treatment plan with patient or surrogate, discussions with consultants, evaluation of patient's response to treatment, examination of patient, obtaining history from patient or surrogate, ordering and performing treatments and interventions, ordering and review of laboratory studies, ordering and review of radiographic studies, pulse oximetry, re-evaluation of patient's condition and review of old charts      Critical Care performed:  yes ____________________________________________   INITIAL IMPRESSION / ASSESSMENT AND PLAN / ED COURSE  Pertinent labs & imaging results that were available during my care of the patient were reviewed by me and considered in my medical decision making (see chart for details).  DDX: electrolyte abn, dysrthmia, dehydration, medication effect, gi lossess  Francisco Gibbs is a 84 y.o. who presents to the ED with profound hypomagnesemia failing outpatient management.  I do suspect this is multifactorial as the patient is on Lasix as well as Protonix and has been having diarrhea for the past several days.  Patient also with evidence of bradycardia which is concerning for Mobitz type I.  No evidence of high-grade heart block at this point but given the concern for dysrhythmia patient will benefit from hospitalization for continued IV magnesium repletion that  I have started in the ER.  Have discussed with the patient and available family all diagnostics and treatments performed thus far and all questions were answered to the best of my ability. The patient demonstrates understanding and agreement with plan.       As part of my medical decision making, I reviewed the following data within the [place] notes reviewed and incorporated, Labs reviewed, notes from prior ED visits   ____________________________________________   FINAL CLINICAL IMPRESSION(S) / ED DIAGNOSES  Final diagnoses:  Hypomagnesemia syndrome      NEW MEDICATIONS STARTED DURING THIS VISIT:  New Prescriptions   No medications on file     Note:  This document was prepared using Dragon voice recognition software and may include unintentional dictation errors.    Merlyn Lot, MD 04/28/18 1346

## 2018-04-28 NOTE — ED Notes (Signed)
Pt up to toilet 

## 2018-04-28 NOTE — H&P (Signed)
[place] [facility] [place] NAME: Francisco Gibbs    MR#:  [id number]  DATE OF BIRTH:  November 30, 1933  DATE OF ADMISSION:  04/28/2018  PRIMARY CARE PHYSICIAN: Letta Median, MD   REQUESTING/REFERRING PHYSICIAN: Dr. Merlyn Lot  CHIEF COMPLAINT: Abnormal labs   Chief Complaint  Patient presents with  . Abnormal Lab    HISTORY OF PRESENT ILLNESS:  Francisco Gibbs  is a 83 y.o. male with a known history of essential hypertension, diabetes mellitus type 2 sent by PCP because of abnormal labs with magnesium of 0.4.  Started on magnesium supplements with PCP recently patient takes Lasix.,  Metoprolol.  Metformin at home.  PAST MEDICAL HISTORY:   Past Medical History:  Diagnosis Date  . Diabetes mellitus without complication ([place])    Pt takes Metformin.  Marland Kitchen Hypertension     PAST SURGICAL HISTOIRY:   Past Surgical History:  Procedure Laterality Date  . HYDROCELE EXCISION / REPAIR    . INGUINAL HERNIA REPAIR    . KNEE SURGERY      SOCIAL HISTORY:   Social History   Tobacco Use  . Smoking status: Former Research scientist (life sciences)  . Smokeless tobacco: Never Used  Substance Use Topics  . Alcohol use: Not Currently    FAMILY HISTORY:  No family history on file.  DRUG ALLERGIES:  No Known Allergies  REVIEW OF SYSTEMS:  CONSTITUTIONAL: No fever, fatigue or weakness.  EYES: No blurred or double vision.  EARS, NOSE, AND THROAT: No tinnitus or ear pain.  RESPIRATORY: No cough, shortness of breath, wheezing or hemoptysis.  CARDIOVASCULAR: No chest pain, orthopnea, edema.  GASTROINTESTINAL: No nausea, vomiting, diarrhea or abdominal pain.  GENITOURINARY: No dysuria, hematuria.  ENDOCRINE: No polyuria, nocturia,  HEMATOLOGY: No anemia, easy bruising or bleeding SKIN: No rash or lesion. MUSCULOSKELETAL: No joint pain or arthritis.   NEUROLOGIC: No tingling, numbness, weakness.  PSYCHIATRY: No anxiety or depression.   MEDICATIONS AT HOME:    Prior to Admission medications   Medication Sig Start Date End Date Taking? Authorizing Provider  ammonium lactate (AMLACTIN) 12 % cream Apply 1 application topically daily as needed. 02/19/11  Yes [provider]  aspirin 81 MG chewable tablet Chew 1 tablet by mouth daily. 03/31/12  Yes [provider]  atorvastatin (LIPITOR) 80 MG tablet Take 1 tablet by mouth daily. 03/30/18  Yes [provider]  clorazepate (TRANXENE) 7.5 MG tablet Take 1 tablet by mouth daily. 04/27/18  Yes [provider]  cyclobenzaprine (FLEXERIL) 5 MG tablet Take 1 tablet by mouth at bedtime as needed. 10/08/17  Yes [provider]  docusate sodium (COLACE) 100 MG capsule Take 1 capsule by mouth daily. 03/27/16  Yes [provider]  fluticasone (FLONASE) 50 MCG/ACT nasal spray Place 2 sprays into the nose daily as needed. 04/29/17  Yes [provider]  furosemide (LASIX) 40 MG tablet Take 1 tablet by mouth daily.  04/28/18  Yes [provider]  gabapentin (NEURONTIN) 300 MG capsule Take 1 capsule by mouth 3 (three) times daily. 04/27/18  Yes [provider]  losartan (COZAAR) 50 MG tablet Take 1 tablet by mouth daily. 04/08/18  Yes [provider]  metFORMIN (GLUCOPHAGE-XR) 500 MG 24 hr tablet Take 1,000 mg by mouth 2 (two) times daily. 11/29/17  Yes [provider]  metoprolol succinate (TOPROL-XL) 50 MG 24 hr tablet Take 1 tablet by mouth daily. 03/29/18  Yes [provider]  omeprazole (PRILOSEC) 40 MG  capsule Take 1 capsule by mouth daily. 03/30/18  Yes [provider]  quinapril (ACCUPRIL) 40 MG tablet Take 1 tablet by mouth daily. 03/29/18  Yes [provider]  SPIRIVA HANDIHALER 18 MCG inhalation capsule Place 1 capsule into inhaler and inhale daily. 03/30/18  Yes [provider]  terazosin (HYTRIN) 1 MG capsule Take 1 capsule by mouth daily. 03/01/18  Yes [provider]  VENTOLIN HFA  108 (90 Base) MCG/ACT inhaler Inhale 2 puffs into the lungs every 4 (four) hours as needed. 03/30/18  Yes [provider]      VITAL SIGNS:  Blood pressure (!) 150/66, pulse (!) 36, temperature 97.9 F (36.6 C), temperature source Oral, resp. rate 17, height 5\' 7"  (1.702 m), weight 75.8 kg (167 lb), SpO2 97 %.  PHYSICAL EXAMINATION:  GENERAL:  84 y.o.-year-old patient lying in the bed with no acute distress.  EYES: Pupils equal, round, reactive to light and accommodation. No scleral icterus. Extraocular muscles intact.  HEENT: Head atraumatic, normocephalic. Oropharynx and nasopharynx clear.  NECK:  Supple, no jugular venous distention. No thyroid enlargement, no tenderness.  LUNGS: Normal breath sounds bilaterally, no wheezing, rales,rhonchi or crepitation. No use of accessory muscles of respiration.  CARDIOVASCULAR: S1, S2 normal. No murmurs, rubs, or gallops.  ABDOMEN: Soft, nontender, nondistended. Bowel sounds present. No organomegaly or mass.  EXTREMITIES: No pedal edema, cyanosis, or clubbing.  NEUROLOGIC: Cranial nerves II through XII are intact. Muscle strength 5/5 in all extremities. Sensation intact. Gait not checked.  PSYCHIATRIC: The patient is alert and oriented x 3.  SKIN: No obvious rash, lesion, or ulcer.   LABORATORY PANEL:   CBC Recent Labs  Lab 04/28/18 1051  WBC 5.3  HGB 10.6*  HCT 32.0*  PLT 134*   ------------------------------------------------------------------------------------------------------------------  Chemistries  Recent Labs  Lab 04/28/18 1051 04/28/18 1209  NA 139  --   K 3.9  --   CL 104  --   CO2 26  --   GLUCOSE 116*  --   BUN 23*  --   CREATININE 1.05  --   CALCIUM 7.4*  --   MG  --  0.4*  AST 29  --   ALT 19  --   ALKPHOS 52  --   BILITOT 0.5  --    ------------------------------------------------------------------------------------------------------------------  Cardiac Enzymes No results for input(s): TROPONINI  in the last 168 hours. ------------------------------------------------------------------------------------------------------------------  RADIOLOGY:  No results found.  EKG:   Orders placed or performed during the hospital encounter of 04/28/18  . ED EKG  . ED EKG  . EKG 12-Lead  . EKG 12-Lead  . EKG 12-Lead  . EKG 12-Lead   EKG shows sinus rhythm with 57 bpm, no ST-T changes.  But when patient came she he is severely bradycardic with heart rate 30 bpm, changes concerning for Mobitz type I heart block.  Patient also found to have PACs. IMPRESSION AND PLAN:  Severe hypomagnesemia: Recurrent: Admit to hospital, give IV magnesium, likely discharge home tomorrow patient really does not want to get admitted today told him that admission is warranted because of hypomagnesemia, bradycardia. 2.  Bradycardia likely secondary to severe hypomagnesemia.  Hold metoprolol, request cardiology consult, patient supposed to see Dr. Juleen China tomorrow anyway. 3.  Diabetes mellitus without complication, restarted metformin. 4.  Essential hypertension, pedal edema, patient is on Lasix, losartan: Continue them.     All the records are reviewed and case discussed with ED provider. Management plans discussed with the patient, family and  they are in agreement.  CODE STATUS: Full code  TOTAL TIME TAKING CARE OF THIS PATIENT: 55 minutes.    Epifanio Lesches M.D on 04/28/2018 at 2:45 PM  Between 7am to 6pm - Pager - [phone]  After 6pm go to [email or web address] - password [email or web address] [email or web address]  Office  [phone]  CC: Primary care physician; Letta Median, MD  Note: This dictation was prepared with Dragon dictation along with smaller phrase technology. Any transcriptional errors that result from this process are unintentional.

## 2018-04-28 NOTE — ED Triage Notes (Signed)
Pt comes into the ED via POV c/o abnormal labs.  Patient's MD has been watching them and giving him medications but has seen no improvement.  Patient has a magnesium level of .4 and calcium was 7.6.  Patient is being seen by Dr. Sherril Cong.  Patient denies any chest pain, shortness of breath or dizziness.  Patient states he has had increased muscle aches.

## 2018-04-28 NOTE — ED Notes (Signed)
HS Ann to follow up with 2A regarding pt coming to floor

## 2018-04-28 NOTE — ED Notes (Signed)
Date and time results received: 04/28/18 1227 (use smartphrase ".now" to insert current time)  Test: magnesium Critical Value: 0.4  Name of Provider Notified: Quentin Cornwall  Orders Received? Or Actions Taken?: Orders Received - See Orders for details

## 2018-04-28 NOTE — ED Notes (Signed)
Attempted to call report for 2nd time. Obie Dredge RN stated they were waiting on another RN to take pt. Will notify Charge Annie Main

## 2018-04-29 LAB — BASIC METABOLIC PANEL
ANION GAP: 7 (ref 5–15)
BUN: 19 mg/dL (ref 6–20)
CALCIUM: 7.4 mg/dL — AB (ref 8.9–10.3)
CO2: 28 mmol/L (ref 22–32)
Chloride: 104 mmol/L (ref 101–111)
Creatinine, Ser: 0.85 mg/dL (ref 0.61–1.24)
GFR calc Af Amer: >60 mL/min (ref >60)
GLUCOSE: 106 mg/dL — AB (ref 65–99)
Potassium: 3.4 mmol/L — ABNORMAL LOW (ref 3.5–5.1)
Sodium: 139 mmol/L (ref 135–145)

## 2018-04-29 LAB — CBC
HCT: 28.7 % — ABNORMAL LOW (ref 40.0–52.0)
HEMOGLOBIN: 9.8 g/dL — AB (ref 13.0–18.0)
MCH: 30.2 pg (ref 26.0–34.0)
MCHC: 34 g/dL (ref 32.0–36.0)
MCV: 88.8 fL (ref 80.0–100.0)
Platelets: 111 10*3/uL — ABNORMAL LOW (ref 150–440)
RBC: 3.23 MIL/uL — ABNORMAL LOW (ref 4.40–5.90)
RDW: 14.1 % (ref 11.5–14.5)
WBC: 4.4 10*3/uL (ref 3.8–10.6)

## 2018-04-29 LAB — GLUCOSE, CAPILLARY
GLUCOSE-CAPILLARY: 98 mg/dL (ref 65–99)
Glucose-Capillary: 126 mg/dL — ABNORMAL HIGH (ref 65–99)

## 2018-04-29 LAB — MAGNESIUM: MAGNESIUM: 1.1 mg/dL — AB (ref 1.7–2.4)

## 2018-04-29 MED ORDER — FUROSEMIDE 40 MG PO TABS: 20.0000 mg | ORAL_TABLET | Freq: Every day | ORAL | 11 refills | Status: DC

## 2018-04-29 MED ORDER — MAGNESIUM OXIDE 400 (241.3 MG) MG PO TABS
400.0000 mg | ORAL_TABLET | Freq: Every day | ORAL | Status: DC
  Administered 2018-04-29: 400 mg via ORAL

## 2018-04-29 MED ORDER — MAGNESIUM OXIDE 400 (241.3 MG) MG PO TABS: 400.0000 mg | ORAL_TABLET | Freq: Every day | ORAL | Status: DC

## 2018-04-29 MED ORDER — MAGNESIUM OXIDE 400 (241.3 MG) MG PO TABS: 400.0000 mg | ORAL_TABLET | Freq: Every day | ORAL | 0 refills | Status: DC

## 2018-04-29 MED ORDER — HYDRALAZINE HCL 25 MG PO TABS: 25.0000 mg | ORAL_TABLET | Freq: Three times a day (TID) | ORAL | 0 refills | Status: DC

## 2018-04-29 MED ORDER — HYDRALAZINE HCL 25 MG PO TABS
25.0000 mg | ORAL_TABLET | Freq: Three times a day (TID) | ORAL | Status: DC
  Administered 2018-04-29: 25 mg via ORAL
  Filled 2018-04-29: qty 1

## 2018-04-29 MED ORDER — MAGNESIUM OXIDE 400 (241.3 MG) MG PO TABS
400.0000 mg | ORAL_TABLET | Freq: Every day | ORAL | Status: DC
  Filled 2018-04-29: qty 1

## 2018-04-29 NOTE — Plan of Care (Signed)
Telemetry reading with runs Mobitz I and Mobitz II, Wandering atrial pacer and sinus bradycardia.  Pt ambulating in room without distress.

## 2018-04-29 NOTE — Progress Notes (Signed)
Pt discharged home with sister, medication regimen given and educated on, as well as follow up plan instructions. VSS, no complaints at this time.

## 2018-04-29 NOTE — Discharge Summary (Signed)
Francisco Gibbs, is a 84 y.o. male  DOB [birth year]  MRN [id number].  Admission date:  04/28/2018  Admitting Physician  Epifanio Lesches, MD  Discharge Date:  04/29/2018   Primary MD  Letta Median, MD  Recommendations for primary care physician for things to follow:   Follow-up with Dr. Clayborn Bigness in 3 to 5 days.   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 Diabetes mellitus without complication (Francisco Gibbs)    Pt takes Metformin.  Francisco Gibbs Hypertension     Past Surgical History:  Procedure Laterality Date  . HYDROCELE EXCISION / REPAIR    . INGUINAL HERNIA REPAIR    . KNEE SURGERY         History of present illness and  Gibbs Course:     Kindly see H&P for history of present illness and admission details, please review complete Labs, Consult reports and Test reports for all details in brief  HPI  from the history and physical done on the day of admission 84 year old male patient with history of hypertension, diabetes mellitus came in because PCP sent him due to abnormal labs.  Found to have severe hypomagnesemia level of 0.4, and also has bradycardia with heart rate around 3 0 in Mobitz type I heart block.   Gibbs Course  #1 severe hypomagnesemia: Likely secondary to diuretics patient PCP is trying to replace magnesium but patient does not know how much magnesium is taking.'  So patient received IV magnesium total of 4 g, started on magnesium supplements.  Patient magnesium today is 1.1.  Patient will receive magnesium 400 mg daily for 5 days, patient advised to follow-up with PCP regarding further meds magnesium supplements. 2.  Severe bradycardia with heart  rate of 30 bpm when he came in he was in Mobitz type I heart block, metoprolol has been stopped and admitted to telemetry.  Now to 62 bpm.  Heart rate improved spoke with Dr. Clayborn Bigness from cardiology, he recommended to discontinue Toprol-XL at discharge, he is going to see him in the office next week and make further recommendation.  Patient found to be in A. fib with heart rate 62 bpm.  Dr. Clayborn Bigness said he is going to discuss anticoagulation in the office. 3.  Essential hypertension, leg edema: Patient Lasix dose is decreased to 20 mg daily, added hydralazine for blood pressure. 4.  History of COPD, heavy smoking before patient was smoking about 3 packs/day now almost smokes occasionally.  No wheezing, use his home dose inhalers. 5.Anxiety;continue clorazepate #6. diabetes mellitus type 2: Continue metformin 1 g p.o. twice daily.    Discharge Condition: stable   Follow UP  Follow-up Francisco Gibbs. Go on 05/03/2018.   Specialty:  General Practice Why:  Appointment Time: 1:10pm Contact information: [place] [address] [phone]        Yolonda Kida, MD. Go on 05/04/2018.   Specialties:  Cardiology, Internal Medicine Why:  Appointment Time: 3:00pm Contact information: Francisco Gibbs [id number] [phone]             Discharge Instructions  and  Discharge Medications      Allergies as of 04/29/2018   No Known Allergies     Medication List    STOP taking these medications  metoprolol succinate 50 MG 24 hr tablet Commonly known as:  TOPROL-XL     TAKE these medications   ammonium lactate 12 % cream Commonly known as:  AMLACTIN Apply 1 application topically daily as needed.   aspirin 81 MG chewable tablet Chew 1 tablet by mouth daily.   atorvastatin 80 MG tablet Commonly known as:  LIPITOR Take 1 tablet by mouth daily.   clorazepate 7.5 MG tablet Commonly known as:   TRANXENE Take 1 tablet by mouth daily.   COLACE 100 MG capsule Generic drug:  docusate sodium Take 1 capsule by mouth daily.   cyclobenzaprine 5 MG tablet Commonly known as:  FLEXERIL Take 1 tablet by mouth at bedtime as needed.   fluticasone 50 MCG/ACT nasal spray Commonly known as:  FLONASE Place 2 sprays into the nose daily as needed.   furosemide 40 MG tablet Commonly known as:  LASIX Take 0.5 tablets (20 mg total) by mouth daily. What changed:  how much to take   gabapentin 300 MG capsule Commonly known as:  NEURONTIN Take 1 capsule by mouth 3 (three) times daily.   hydrALAZINE 25 MG tablet Commonly known as:  APRESOLINE Take 1 tablet (25 mg total) by mouth every 8 (eight) hours.   losartan 50 MG tablet Commonly known as:  COZAAR Take 1 tablet by mouth daily.   magnesium oxide 400 (241.3 Mg) MG tablet Commonly known as:  MAG-OX Take 1 tablet (400 mg total) by mouth daily.   metFORMIN 500 MG 24 hr tablet Commonly known as:  GLUCOPHAGE-XR Take 1,000 mg by mouth 2 (two) times daily.   omeprazole 40 MG capsule Commonly known as:  PRILOSEC Take 1 capsule by mouth daily.   quinapril 40 MG tablet Commonly known as:  ACCUPRIL Take 1 tablet by mouth daily.   SPIRIVA HANDIHALER 18 MCG inhalation capsule Generic drug:  tiotropium Place 1 capsule into inhaler and inhale daily.   terazosin 1 MG capsule Commonly known as:  HYTRIN Take 1 capsule by mouth daily.   VENTOLIN HFA 108 (90 Base) MCG/ACT inhaler Generic drug:  albuterol Inhale 2 puffs into the lungs every 4 (four) hours as needed.         Diet and Activity recommendation: See Discharge Instructions above   Consults obtained -cardiology   Major procedures and Radiology Reports - PLEASE review detailed and final reports for all details, in brief -      No results found.  Micro Results     No results found for this or any previous visit (from the past 240 hour(s)).     Today    Subjective:   [name] today has no headache,no chest abdominal pain,no new weakness tingling or numbness, feels much better wants to go home today.   Objective:   Blood pressure (!) 177/65, pulse 62, temperature 98 F (36.7 C), temperature source Oral, resp. rate 16, height 5\' 7"  (1.702 m), weight 73.9 kg (163 lb), SpO2 96 %.   Intake/Output Summary (Last 24 hours) at 04/29/2018 1010 Last data filed at 04/29/2018 0739 Gross per 24 hour  Intake -  Output 500 ml  Net -500 ml    Exam Awake Alert, Oriented x 3, No new F.N deficits, Normal affect [place]kins.AT,PERRAL Supple Neck,No JVD, No cervical lymphadenopathy appriciated.  Symmetrical Chest wall movement, Good air movement bilaterally, CTAB RRR,No Gallops,Rubs or new Murmurs, No Parasternal Heave +ve B.Sounds, Abd Soft, Non tender, No organomegaly appriciated, No rebound -guarding or rigidity. No Cyanosis, Clubbing or  edema, No new Rash or bruise  Data Review   CBC w Diff:  Lab Results  Component Value Date   WBC 4.4 04/29/2018   HGB 9.8 (L) 04/29/2018   HGB 12.9 (L) 12/12/2011   HCT 28.7 (L) 04/29/2018   HCT 38.5 (L) 12/12/2011   PLT 111 (L) 04/29/2018   PLT 171 12/12/2011   LYMPHOPCT 36 04/28/2018   LYMPHOPCT 28.9 12/12/2011   MONOPCT 10 04/28/2018   MONOPCT 7.3 12/12/2011   EOSPCT 1 04/28/2018   EOSPCT 0.2 12/12/2011   BASOPCT 1 04/28/2018   BASOPCT 0.3 12/12/2011    CMP:  Lab Results  Component Value Date   NA 139 04/29/2018   NA 140 12/12/2011   K 3.4 (L) 04/29/2018   K 4.4 12/12/2011   CL 104 04/29/2018   CL 103 12/12/2011   CO2 28 04/29/2018   CO2 29 12/12/2011   BUN 19 04/29/2018   BUN 17 12/12/2011   CREATININE 0.85 04/29/2018   CREATININE 0.80 12/12/2011   PROT 6.5 04/28/2018   PROT 7.4 12/09/2011   ALBUMIN 4.0 04/28/2018   ALBUMIN 4.2 12/09/2011   BILITOT 0.5 04/28/2018   BILITOT 0.3 12/09/2011   ALKPHOS 52 04/28/2018   ALKPHOS 61 12/09/2011   AST 29 04/28/2018   AST 32 12/09/2011    ALT 19 04/28/2018   ALT 33 12/09/2011  .   Total Time in preparing paper work, data evaluation and todays exam - 35 minutes  Epifanio Lesches M.D on 04/29/2018 at 10:10 AM    Note: This dictation was prepared with Dragon dictation along with smaller phrase technology. Any transcriptional errors that result from this process are unintentional.

## 2018-06-24 ENCOUNTER — Inpatient Hospital Stay
Admission: EM | Admit: 2018-06-24 | Discharge: 2018-06-28 | DRG: 871 | Disposition: A | Discharge status: Home Health | Payer: Medicare Other | Attending: Specialist | Admitting: Specialist

## 2018-06-24 ENCOUNTER — Emergency Department: Payer: Medicare Other

## 2018-06-24 ENCOUNTER — Other Ambulatory Visit: Payer: Self-pay

## 2018-06-24 DIAGNOSIS — E119 Type 2 diabetes mellitus without complications: Secondary | ICD-10-CM

## 2018-06-24 DIAGNOSIS — J449 Chronic obstructive pulmonary disease, unspecified: Secondary | ICD-10-CM | POA: Diagnosis present

## 2018-06-24 DIAGNOSIS — Z87891 Personal history of nicotine dependence: Secondary | ICD-10-CM

## 2018-06-24 DIAGNOSIS — A419 Sepsis, unspecified organism: Principal | ICD-10-CM | POA: Diagnosis present

## 2018-06-24 DIAGNOSIS — J189 Pneumonia, unspecified organism: Secondary | ICD-10-CM | POA: Diagnosis present

## 2018-06-24 DIAGNOSIS — Z79899 Other long term (current) drug therapy: Secondary | ICD-10-CM

## 2018-06-24 DIAGNOSIS — R509 Fever, unspecified: Secondary | ICD-10-CM

## 2018-06-24 DIAGNOSIS — J441 Chronic obstructive pulmonary disease with (acute) exacerbation: Secondary | ICD-10-CM

## 2018-06-24 DIAGNOSIS — R0902 Hypoxemia: Secondary | ICD-10-CM | POA: Diagnosis not present

## 2018-06-24 DIAGNOSIS — J440 Chronic obstructive pulmonary disease with acute lower respiratory infection: Secondary | ICD-10-CM | POA: Diagnosis present

## 2018-06-24 DIAGNOSIS — E785 Hyperlipidemia, unspecified: Secondary | ICD-10-CM | POA: Diagnosis present

## 2018-06-24 DIAGNOSIS — Z7951 Long term (current) use of inhaled steroids: Secondary | ICD-10-CM

## 2018-06-24 DIAGNOSIS — Z7984 Long term (current) use of oral hypoglycemic drugs: Secondary | ICD-10-CM

## 2018-06-24 DIAGNOSIS — J9601 Acute respiratory failure with hypoxia: Secondary | ICD-10-CM | POA: Diagnosis present

## 2018-06-24 DIAGNOSIS — I10 Essential (primary) hypertension: Secondary | ICD-10-CM | POA: Diagnosis present

## 2018-06-24 DIAGNOSIS — N400 Enlarged prostate without lower urinary tract symptoms: Secondary | ICD-10-CM | POA: Diagnosis present

## 2018-06-24 DIAGNOSIS — K219 Gastro-esophageal reflux disease without esophagitis: Secondary | ICD-10-CM | POA: Diagnosis present

## 2018-06-24 DIAGNOSIS — Z7982 Long term (current) use of aspirin: Secondary | ICD-10-CM

## 2018-06-24 HISTORY — DX: Chronic obstructive pulmonary disease, unspecified: J44.9

## 2018-06-24 HISTORY — DX: Hyperlipidemia, unspecified: E78.5

## 2018-06-24 HISTORY — DX: Gastro-esophageal reflux disease without esophagitis: K21.9

## 2018-06-24 LAB — CBC WITH DIFFERENTIAL/PLATELET
BASOS ABS: 0 10*3/uL (ref 0–0.1)
Basophils Relative: 0 %
Eosinophils Absolute: 0 10*3/uL (ref 0–0.7)
Eosinophils Relative: 0 %
HEMATOCRIT: 31.9 % — AB (ref 40.0–52.0)
HEMOGLOBIN: 10.7 g/dL — AB (ref 13.0–18.0)
LYMPHS PCT: 16 %
Lymphs Abs: 1.5 10*3/uL (ref 1.0–3.6)
MCH: 30.3 pg (ref 26.0–34.0)
MCHC: 33.5 g/dL (ref 32.0–36.0)
MCV: 90.5 fL (ref 80.0–100.0)
MONO ABS: 1.3 10*3/uL — AB (ref 0.2–1.0)
Monocytes Relative: 13 %
NEUTROS ABS: 6.7 10*3/uL — AB (ref 1.4–6.5)
Neutrophils Relative %: 71 %
Platelets: 139 10*3/uL — ABNORMAL LOW (ref 150–440)
RBC: 3.52 MIL/uL — AB (ref 4.40–5.90)
RDW: 14.3 % (ref 11.5–14.5)
WBC: 9.5 10*3/uL (ref 3.8–10.6)

## 2018-06-24 LAB — COMPREHENSIVE METABOLIC PANEL
ALT: 24 U/L (ref 0–44)
AST: 30 U/L (ref 15–41)
Albumin: 3.7 g/dL (ref 3.5–5.0)
Alkaline Phosphatase: 58 U/L (ref 38–126)
Anion gap: 10 (ref 5–15)
BUN: 23 mg/dL (ref 8–23)
CHLORIDE: 102 mmol/L (ref 98–111)
CO2: 26 mmol/L (ref 22–32)
Calcium: 9.6 mg/dL (ref 8.9–10.3)
Creatinine, Ser: 1.14 mg/dL (ref 0.61–1.24)
GFR calc Af Amer: >60 mL/min (ref >60)
GFR, EST NON AFRICAN AMERICAN: 58 mL/min — AB (ref >60)
Glucose, Bld: 156 mg/dL — ABNORMAL HIGH (ref 70–99)
POTASSIUM: 4.1 mmol/L (ref 3.5–5.1)
Sodium: 138 mmol/L (ref 135–145)
Total Bilirubin: 0.9 mg/dL (ref 0.3–1.2)
Total Protein: 6.8 g/dL (ref 6.5–8.1)

## 2018-06-24 LAB — LACTIC ACID, PLASMA: LACTIC ACID, VENOUS: 1.6 mmol/L (ref 0.5–1.9)

## 2018-06-24 LAB — PROTIME-INR
INR: 1.24
Prothrombin Time: 15.5 seconds — ABNORMAL HIGH (ref 11.4–15.2)

## 2018-06-24 MED ORDER — SODIUM CHLORIDE 0.9 % IV BOLUS
1000.0000 mL | Freq: Once | INTRAVENOUS | Status: AC
  Administered 2018-06-24: 1000 mL via INTRAVENOUS

## 2018-06-24 MED ORDER — IOPAMIDOL (ISOVUE-370) INJECTION 76%
75.0000 mL | Freq: Once | INTRAVENOUS | Status: AC | PRN
  Administered 2018-06-24: 75 mL via INTRAVENOUS

## 2018-06-24 MED ORDER — SODIUM CHLORIDE 0.9 % IV SOLN
500.0000 mg | Freq: Once | INTRAVENOUS | Status: AC
  Administered 2018-06-24: 500 mg via INTRAVENOUS
  Filled 2018-06-24: qty 500

## 2018-06-24 MED ORDER — SODIUM CHLORIDE 0.9 % IV SOLN
1.0000 g | Freq: Once | INTRAVENOUS | Status: AC
  Administered 2018-06-24: 1 g via INTRAVENOUS
  Filled 2018-06-24: qty 10

## 2018-06-24 MED ORDER — IPRATROPIUM-ALBUTEROL 0.5-2.5 (3) MG/3ML IN SOLN
RESPIRATORY_TRACT | Status: AC
  Administered 2018-06-24
  Filled 2018-06-24: qty 3

## 2018-06-24 NOTE — Progress Notes (Signed)
CODE SEPSIS - PHARMACY COMMUNICATION  **Broad Spectrum Antibiotics should be administered within 1 hour of Sepsis diagnosis**  Time Code Sepsis Called/Page Received: @ 2137  Antibiotics Ordered: Ceftriaxone 1g                                      Azithromycin 500mg   Time of 1st antibiotic administration: @ 2202  Additional action taken by pharmacy: NA  If necessary, Name of Provider/Nurse Contacted: NA  Pernell Dupre, PharmD, [place] Pharmacist 06/24/2018 10:06 PM

## 2018-06-24 NOTE — ED Notes (Signed)
Pt sitting up in bed drinking water. Family at bedside.

## 2018-06-24 NOTE — ED Notes (Signed)
Pt to ct 

## 2018-06-24 NOTE — ED Triage Notes (Addendum)
Brought in by [facility], from home. +weakness and SOB that started 1 week ago. CP today. +productive cough. A & O x4. Hx DM and COPD, not on home O2, 79% RA for EMS. EMS gave 1 duoneb en route.

## 2018-06-24 NOTE — ED Provider Notes (Signed)
[facility] Emergency Department Provider Note   ____________________________________________   First MD Initiated Contact with Patient 06/24/18 2141     (approximate)  I have reviewed the triage vital signs and the nursing notes.   HISTORY  Chief Complaint Weakness and Shortness of Breath    HPI Francisco Gibbs is a 84 y.o. male he reports weakness shortness of breath and coughing up thick whitish phlegm that started about a week ago.  Patient has a history of diabetes and COPD.  He is not on oxygen at home.  EMS reported 79% O2 sat.  Here patient's O2 sat is 88-90.  He is coughing up thick white phlegm.  He has a fever of 102 and a heart rate of 116.  Chest x-ray does not show anything obvious.  It was read as negative by radiology.  Discussed patient with Dr. Marcille Blanco.  We will do a chest CT just in case he could have a PE.  This also will show [place] any occult pneumonia.  Plan on admitting the patient.   Past Medical History:  Diagnosis Date  . Diabetes mellitus without complication ([place])    Pt takes Metformin.  Marland Kitchen Hypertension     Patient Active Problem List   Diagnosis Date Noted  . Hypomagnesemia 04/28/2018    Past Surgical History:  Procedure Laterality Date  . HYDROCELE EXCISION / REPAIR    . INGUINAL HERNIA REPAIR    . KNEE SURGERY      Prior to Admission medications   Medication Sig Start Date End Date Taking? Authorizing Provider  ammonium lactate (AMLACTIN) 12 % cream Apply 1 application topically daily as needed. 02/19/11   [provider]  aspirin 81 MG chewable tablet Chew 1 tablet by mouth daily. 03/31/12   [provider]  atorvastatin (LIPITOR) 80 MG tablet Take 1 tablet by mouth daily. 03/30/18   [provider]  clorazepate (TRANXENE) 7.5 MG tablet Take 1 tablet by mouth daily. 04/27/18   [provider]  cyclobenzaprine (FLEXERIL) 5 MG tablet Take 1 tablet by mouth at bedtime as needed. 10/08/17    [provider]  docusate sodium (COLACE) 100 MG capsule Take 1 capsule by mouth daily. 03/27/16   [provider]  fluticasone (FLONASE) 50 MCG/ACT nasal spray Place 2 sprays into the nose daily as needed. 04/29/17   [provider]  furosemide (LASIX) 40 MG tablet Take 0.5 tablets (20 mg total) by mouth daily. 04/29/18 04/29/19  Epifanio Lesches, MD  gabapentin (NEURONTIN) 300 MG capsule Take 1 capsule by mouth 3 (three) times daily. 04/27/18   [provider]  hydrALAZINE (APRESOLINE) 25 MG tablet Take 1 tablet (25 mg total) by mouth every 8 (eight) hours. 04/29/18   Epifanio Lesches, MD  losartan (COZAAR) 50 MG tablet Take 1 tablet by mouth daily. 04/08/18   [provider]  magnesium oxide (MAG-OX) 400 (241.3 Mg) MG tablet Take 1 tablet (400 mg total) by mouth daily. 04/29/18   Epifanio Lesches, MD  metFORMIN (GLUCOPHAGE-XR) 500 MG 24 hr tablet Take 1,000 mg by mouth 2 (two) times daily. 11/29/17   [provider]  omeprazole (PRILOSEC) 40 MG capsule Take 1 capsule by mouth daily. 03/30/18   [provider]  quinapril (ACCUPRIL) 40 MG tablet Take 1 tablet by mouth daily. 03/29/18   [provider]  SPIRIVA HANDIHALER 18 MCG inhalation capsule Place 1 capsule into inhaler and inhale daily. 03/30/18   [provider]  terazosin (  HYTRIN) 1 MG capsule Take 1 capsule by mouth daily. 03/01/18   [provider]  VENTOLIN HFA 108 (90 Base) MCG/ACT inhaler Inhale 2 puffs into the lungs every 4 (four) hours as needed. 03/30/18   [provider]    Allergies Patient has no known allergies.  History reviewed. No pertinent family history.  Social History Social History   Tobacco Use  . Smoking status: Former Research scientist (life sciences)  . Smokeless tobacco: Never Used  Substance Use Topics  . Alcohol use: Not Currently  . Drug use: Never    Review of Systems  Constitutional:  fever/chills Eyes: No visual  changes. ENT: No sore throat. Cardiovascular: Denies chest pain. Respiratory:  shortness of breath. Gastrointestinal: No abdominal pain.  No nausea, no vomiting.  No diarrhea.  No constipation. Genitourinary: Negative for dysuria. Musculoskeletal: Negative for back pain. Skin: Negative for rash. Neurological: Negative for headaches, focal weakness   ____________________________________________   PHYSICAL EXAM:  VITAL SIGNS: ED Triage Vitals  Enc Vitals Group     BP 06/24/18 2118 127/75     Pulse Rate 06/24/18 2118 (!) 116     Resp 06/24/18 2118 20     Temp 06/24/18 2118 (!) 102.3 F (39.1 C)     Temp Source 06/24/18 2118 Oral     SpO2 06/24/18 2118 95 %     Weight 06/24/18 2120 176 lb (79.8 kg)     Height 06/24/18 2120 5\' 6"  (1.676 m)     Head Circumference --      Peak Flow --      Pain Score 06/24/18 2119 9     Pain Loc --      Pain Edu? --      Excl. in [place]? --     Constitutional: Alert and oriented. Well appearing and in no acute distress for the cough. Eyes: Conjunctivae are normal.  Head: Atraumatic. Nose: No congestion/rhinnorhea. Mouth/Throat: Mucous membranes are moist.  Oropharynx non-erythematous. Neck: No stridor.  Cardiovascular: Rapid rate, regular rhythm. Grossly normal heart sounds.  Good peripheral circulation. Respiratory: Normal respiratory effort.  No retractions. Lungs added crackles. Gastrointestinal: Soft and nontender. No distention. No abdominal bruits. No CVA tenderness. Musculoskeletal: No lower extremity tenderness  edema.   Neurologic:  Normal speech and language. No gross focal neurologic deficits are appreciated.  Skin:  Skin is warm, dry and intact. No rash noted. Psychiatric: Mood and affect are normal. Speech and behavior are normal.  ____________________________________________   LABS (all labs ordered are listed, but only abnormal results are displayed)  Labs Reviewed  COMPREHENSIVE METABOLIC PANEL - Abnormal; Notable for the  following components:      Result Value   Glucose, Bld 156 (*)    GFR calc non Af Amer 58 (*)    All other components within normal limits  CBC WITH DIFFERENTIAL/PLATELET - Abnormal; Notable for the following components:   RBC 3.52 (*)    Hemoglobin 10.7 (*)    HCT 31.9 (*)    Platelets 139 (*)    Neutro Abs 6.7 (*)    Monocytes Absolute 1.3 (*)    All other components within normal limits  PROTIME-INR - Abnormal; Notable for the following components:   Prothrombin Time 15.5 (*)    All other components within normal limits  CULTURE, BLOOD (ROUTINE X 2)  CULTURE, BLOOD (ROUTINE X 2)  LACTIC ACID, PLASMA  LACTIC ACID, PLASMA  URINALYSIS, COMPLETE (UACMP) WITH MICROSCOPIC  URINALYSIS, ROUTINE W REFLEX MICROSCOPIC  I-STAT CG4 LACTIC  ACID, ED  I-STAT CG4 LACTIC ACID, ED   ____________________________________________  EKG  EKG read and interpreted by me shows sinus tachycardia rate of 112 normal axis no acute ST-T wave changes ____________________________________________  RADIOLOGY  ED MD interpretation: Test x-ray read by radiology reviewed by me shows no obvious pneumonia there is a suggestion of a slight posterior infiltrate and effusion  Official radiology report(s): Dg Chest 2 View  Result Date: 06/24/2018 CLINICAL DATA:  Weakness and short of breath EXAM: CHEST - 2 VIEW COMPARISON:  01/25/2018 FINDINGS: Bibasilar nodular densities are most likely nipple shadows. Interstitial prominence is chronic. Normal heart size. No pneumothorax or pleural effusion. IMPRESSION: No active cardiopulmonary disease. Electronically Signed   By: Marybelle Killings M.D.   On: 06/24/2018 21:51    ____________________________________________   PROCEDURES  Procedure(s) performed:   Procedures  Critical Care performed:  ____________________________________________   INITIAL IMPRESSION / ASSESSMENT AND PLAN / ED COURSE  Patient with fever hypoxia cough possible pneumonia is not evident on  chest x-ray we will plan on getting the CT and admitting him.      ____________________________________________   FINAL CLINICAL IMPRESSION(S) / ED DIAGNOSES  Final diagnoses:  Fever, unspecified fever cause  Hypoxia  COPD exacerbation ([place])   Also possible pneumonia ED Discharge Orders    None       Note:  This document was prepared using Dragon voice recognition software and may include unintentional dictation errors.    Nena Polio, MD 06/24/18 2259

## 2018-06-25 ENCOUNTER — Encounter: Payer: Self-pay | Admitting: Internal Medicine

## 2018-06-25 DIAGNOSIS — Z79899 Other long term (current) drug therapy: Secondary | ICD-10-CM | POA: Diagnosis not present

## 2018-06-25 DIAGNOSIS — K219 Gastro-esophageal reflux disease without esophagitis: Secondary | ICD-10-CM | POA: Diagnosis present

## 2018-06-25 DIAGNOSIS — Z7984 Long term (current) use of oral hypoglycemic drugs: Secondary | ICD-10-CM | POA: Diagnosis not present

## 2018-06-25 DIAGNOSIS — A419 Sepsis, unspecified organism: Secondary | ICD-10-CM | POA: Diagnosis present

## 2018-06-25 DIAGNOSIS — J449 Chronic obstructive pulmonary disease, unspecified: Secondary | ICD-10-CM | POA: Diagnosis present

## 2018-06-25 DIAGNOSIS — R0902 Hypoxemia: Secondary | ICD-10-CM | POA: Diagnosis present

## 2018-06-25 DIAGNOSIS — Z87891 Personal history of nicotine dependence: Secondary | ICD-10-CM | POA: Diagnosis not present

## 2018-06-25 DIAGNOSIS — I10 Essential (primary) hypertension: Secondary | ICD-10-CM | POA: Diagnosis present

## 2018-06-25 DIAGNOSIS — E785 Hyperlipidemia, unspecified: Secondary | ICD-10-CM | POA: Diagnosis present

## 2018-06-25 DIAGNOSIS — J440 Chronic obstructive pulmonary disease with acute lower respiratory infection: Secondary | ICD-10-CM | POA: Diagnosis present

## 2018-06-25 DIAGNOSIS — Z7951 Long term (current) use of inhaled steroids: Secondary | ICD-10-CM | POA: Diagnosis not present

## 2018-06-25 DIAGNOSIS — Z7982 Long term (current) use of aspirin: Secondary | ICD-10-CM | POA: Diagnosis not present

## 2018-06-25 DIAGNOSIS — J189 Pneumonia, unspecified organism: Secondary | ICD-10-CM | POA: Diagnosis present

## 2018-06-25 DIAGNOSIS — N400 Enlarged prostate without lower urinary tract symptoms: Secondary | ICD-10-CM | POA: Diagnosis present

## 2018-06-25 DIAGNOSIS — J9601 Acute respiratory failure with hypoxia: Secondary | ICD-10-CM | POA: Diagnosis present

## 2018-06-25 DIAGNOSIS — J441 Chronic obstructive pulmonary disease with (acute) exacerbation: Secondary | ICD-10-CM | POA: Diagnosis present

## 2018-06-25 DIAGNOSIS — E119 Type 2 diabetes mellitus without complications: Secondary | ICD-10-CM

## 2018-06-25 LAB — URINALYSIS, ROUTINE W REFLEX MICROSCOPIC
BACTERIA UA: NONE SEEN
BILIRUBIN URINE: NEGATIVE
Glucose, UA: NEGATIVE mg/dL
KETONES UR: NEGATIVE mg/dL
Leukocytes, UA: NEGATIVE
NITRITE: NEGATIVE
PH: 5 (ref 5.0–8.0)
Protein, ur: 30 mg/dL — AB
WBC, UA: NONE SEEN WBC/hpf (ref 0–5)

## 2018-06-25 LAB — FOLATE: FOLATE: 11.4 ng/mL (ref >5.9)

## 2018-06-25 LAB — GLUCOSE, CAPILLARY
GLUCOSE-CAPILLARY: 126 mg/dL — AB (ref 70–99)
GLUCOSE-CAPILLARY: 118 mg/dL — AB (ref 70–99)
GLUCOSE-CAPILLARY: 128 mg/dL — AB (ref 70–99)
GLUCOSE-CAPILLARY: 164 mg/dL — AB (ref 70–99)

## 2018-06-25 LAB — CBC
HCT: 26.1 % — ABNORMAL LOW (ref 40.0–52.0)
Hemoglobin: 8.8 g/dL — ABNORMAL LOW (ref 13.0–18.0)
MCH: 30.1 pg (ref 26.0–34.0)
MCHC: 33.6 g/dL (ref 32.0–36.0)
MCV: 89.5 fL (ref 80.0–100.0)
PLATELETS: 120 10*3/uL — AB (ref 150–440)
RBC: 2.91 MIL/uL — ABNORMAL LOW (ref 4.40–5.90)
RDW: 14.2 % (ref 11.5–14.5)
WBC: 6.9 10*3/uL (ref 3.8–10.6)

## 2018-06-25 LAB — MAGNESIUM: MAGNESIUM: 1.2 mg/dL — AB (ref 1.7–2.4)

## 2018-06-25 LAB — RETICULOCYTES
RBC.: 3.26 MIL/uL — ABNORMAL LOW (ref 4.40–5.90)
RETIC COUNT ABSOLUTE: 26.1 10*3/uL (ref 19.0–183.0)
Retic Ct Pct: 0.8 % (ref 0.4–3.1)

## 2018-06-25 LAB — BASIC METABOLIC PANEL
Anion gap: 6 (ref 5–15)
BUN: 22 mg/dL (ref 8–23)
CHLORIDE: 104 mmol/L (ref 98–111)
CO2: 25 mmol/L (ref 22–32)
CREATININE: 1.11 mg/dL (ref 0.61–1.24)
Calcium: 8.7 mg/dL — ABNORMAL LOW (ref 8.9–10.3)
GFR calc Af Amer: >60 mL/min (ref >60)
GFR calc non Af Amer: 59 mL/min — ABNORMAL LOW (ref >60)
Glucose, Bld: 140 mg/dL — ABNORMAL HIGH (ref 70–99)
POTASSIUM: 4 mmol/L (ref 3.5–5.1)
SODIUM: 135 mmol/L (ref 135–145)

## 2018-06-25 LAB — IRON AND TIBC
Iron: 19 ug/dL — ABNORMAL LOW (ref 45–182)
SATURATION RATIOS: 7 % — AB (ref 17.9–39.5)
TIBC: 264 ug/dL (ref 250–450)
UIBC: 245 ug/dL

## 2018-06-25 LAB — CREATININE, SERUM
CREATININE: 1.21 mg/dL (ref 0.61–1.24)
GFR calc Af Amer: >60 mL/min (ref >60)
GFR calc non Af Amer: 54 mL/min — ABNORMAL LOW (ref >60)

## 2018-06-25 LAB — OCCULT BLOOD X 1 CARD TO LAB, STOOL: FECAL OCCULT BLD: NEGATIVE

## 2018-06-25 LAB — TSH: TSH: 1.315 u[IU]/mL (ref 0.350–4.500)

## 2018-06-25 LAB — FERRITIN: Ferritin: 191 ng/mL (ref 24–336)

## 2018-06-25 LAB — VITAMIN B12: Vitamin B-12: 250 pg/mL (ref 180–914)

## 2018-06-25 MED ORDER — ONDANSETRON HCL 4 MG/2ML IJ SOLN: 4.0000 mg | Freq: Four times a day (QID) | INTRAMUSCULAR | Status: DC | PRN

## 2018-06-25 MED ORDER — IPRATROPIUM-ALBUTEROL 0.5-2.5 (3) MG/3ML IN SOLN
3.0000 mL | Freq: Four times a day (QID) | RESPIRATORY_TRACT | Status: DC
  Administered 2018-06-25 – 2018-06-27 (×8): 3 mL via RESPIRATORY_TRACT
  Filled 2018-06-25 (×7): qty 3

## 2018-06-25 MED ORDER — INSULIN ASPART 100 UNIT/ML ~~LOC~~ SOLN
0.0000 [IU] | Freq: Three times a day (TID) | SUBCUTANEOUS | Status: DC
  Administered 2018-06-25 – 2018-06-26 (×4): 1 [IU] via SUBCUTANEOUS
  Administered 2018-06-27: 2 [IU] via SUBCUTANEOUS
  Administered 2018-06-27 – 2018-06-28 (×3): 1 [IU] via SUBCUTANEOUS
  Administered 2018-06-28: 5 [IU] via SUBCUTANEOUS
  Filled 2018-06-25 (×9): qty 1

## 2018-06-25 MED ORDER — METOPROLOL SUCCINATE ER 50 MG PO TB24
50.0000 mg | ORAL_TABLET | Freq: Every day | ORAL | Status: DC
  Administered 2018-06-25 – 2018-06-28 (×3): 50 mg via ORAL
  Filled 2018-06-25 (×4): qty 1

## 2018-06-25 MED ORDER — TERAZOSIN HCL 1 MG PO CAPS
1.0000 mg | ORAL_CAPSULE | Freq: Every day | ORAL | Status: DC
  Administered 2018-06-25 – 2018-06-28 (×3): 1 mg via ORAL
  Filled 2018-06-25 (×4): qty 1

## 2018-06-25 MED ORDER — ONDANSETRON HCL 4 MG PO TABS: 4.0000 mg | ORAL_TABLET | Freq: Four times a day (QID) | ORAL | Status: DC | PRN

## 2018-06-25 MED ORDER — ATORVASTATIN CALCIUM 20 MG PO TABS
80.0000 mg | ORAL_TABLET | Freq: Every day | ORAL | Status: DC
  Administered 2018-06-25 – 2018-06-28 (×4): 80 mg via ORAL
  Filled 2018-06-25 (×4): qty 4

## 2018-06-25 MED ORDER — TIOTROPIUM BROMIDE MONOHYDRATE 18 MCG IN CAPS
1.0000 | ORAL_CAPSULE | Freq: Every day | RESPIRATORY_TRACT | Status: DC
  Administered 2018-06-25 – 2018-06-28 (×4): 18 ug via RESPIRATORY_TRACT
  Filled 2018-06-25: qty 5

## 2018-06-25 MED ORDER — BENZONATATE 100 MG PO CAPS
100.0000 mg | ORAL_CAPSULE | Freq: Three times a day (TID) | ORAL | Status: DC
  Administered 2018-06-25 – 2018-06-28 (×9): 100 mg via ORAL
  Filled 2018-06-25 (×9): qty 1

## 2018-06-25 MED ORDER — SODIUM CHLORIDE 0.9 % IV SOLN
1.0000 g | INTRAVENOUS | Status: DC
  Administered 2018-06-25 – 2018-06-27 (×3): 1 g via INTRAVENOUS
  Filled 2018-06-25 (×2): qty 1
  Filled 2018-06-25: qty 10
  Filled 2018-06-25: qty 1

## 2018-06-25 MED ORDER — CLORAZEPATE DIPOTASSIUM 7.5 MG PO TABS
7.5000 mg | ORAL_TABLET | Freq: Every day | ORAL | Status: DC
  Administered 2018-06-25 – 2018-06-28 (×4): 7.5 mg via ORAL
  Filled 2018-06-25 (×4): qty 1

## 2018-06-25 MED ORDER — ACETAMINOPHEN 500 MG PO TABS
1000.0000 mg | ORAL_TABLET | Freq: Once | ORAL | Status: AC
  Administered 2018-06-25: 1000 mg via ORAL

## 2018-06-25 MED ORDER — ENOXAPARIN SODIUM 40 MG/0.4ML ~~LOC~~ SOLN
40.0000 mg | SUBCUTANEOUS | Status: DC
  Administered 2018-06-25 – 2018-06-27 (×3): 40 mg via SUBCUTANEOUS
  Filled 2018-06-25 (×3): qty 0.4

## 2018-06-25 MED ORDER — PANTOPRAZOLE SODIUM 40 MG PO TBEC
40.0000 mg | DELAYED_RELEASE_TABLET | Freq: Every day | ORAL | Status: DC
  Administered 2018-06-25 – 2018-06-28 (×4): 40 mg via ORAL
  Filled 2018-06-25 (×4): qty 1

## 2018-06-25 MED ORDER — ACETAMINOPHEN 650 MG RE SUPP: 650.0000 mg | Freq: Four times a day (QID) | RECTAL | Status: DC | PRN

## 2018-06-25 MED ORDER — FUROSEMIDE 20 MG PO TABS
20.0000 mg | ORAL_TABLET | Freq: Two times a day (BID) | ORAL | Status: DC
  Administered 2018-06-25 – 2018-06-28 (×7): 20 mg via ORAL
  Filled 2018-06-25 (×7): qty 1

## 2018-06-25 MED ORDER — SODIUM CHLORIDE 0.9 % IV SOLN
500.0000 mg | INTRAVENOUS | Status: DC
  Administered 2018-06-25 – 2018-06-27 (×3): 500 mg via INTRAVENOUS
  Filled 2018-06-25 (×4): qty 500

## 2018-06-25 MED ORDER — ACETAMINOPHEN 500 MG PO TABS
ORAL_TABLET | ORAL | Status: AC
  Filled 2018-06-25: qty 5

## 2018-06-25 MED ORDER — BUDESONIDE 0.5 MG/2ML IN SUSP
0.5000 mg | Freq: Two times a day (BID) | RESPIRATORY_TRACT | Status: DC
  Administered 2018-06-25 – 2018-06-28 (×6): 0.5 mg via RESPIRATORY_TRACT
  Filled 2018-06-25 (×6): qty 2

## 2018-06-25 MED ORDER — GUAIFENESIN 100 MG/5ML PO SOLN
5.0000 mL | ORAL | Status: DC | PRN
  Administered 2018-06-25 – 2018-06-26 (×2): 100 mg via ORAL
  Filled 2018-06-25 (×4): qty 5

## 2018-06-25 MED ORDER — ASPIRIN 81 MG PO CHEW
81.0000 mg | CHEWABLE_TABLET | Freq: Every day | ORAL | Status: DC
  Administered 2018-06-25 – 2018-06-28 (×4): 81 mg via ORAL
  Filled 2018-06-25 (×4): qty 1

## 2018-06-25 MED ORDER — INSULIN ASPART 100 UNIT/ML ~~LOC~~ SOLN: 0.0000 [IU] | Freq: Every day | SUBCUTANEOUS | Status: DC

## 2018-06-25 MED ORDER — ACETAMINOPHEN 325 MG PO TABS: 650.0000 mg | ORAL_TABLET | Freq: Four times a day (QID) | ORAL | Status: DC | PRN

## 2018-06-25 NOTE — ED Notes (Signed)
Spoke with Dr Jannifer Franklin about admission. MD notified of rising temp. Verbal order received for tylenol for pt.

## 2018-06-25 NOTE — H&P (Signed)
[facility] [place] NAME: Francisco Gibbs    MR#:  [id number]  DATE OF BIRTH:  06-Feb-1933  DATE OF ADMISSION:  06/24/2018  PRIMARY CARE PHYSICIAN: [name], MD   REQUESTING/REFERRING PHYSICIAN: [name], MD  CHIEF COMPLAINT:   Chief Complaint  Patient presents with  . Weakness  . Shortness of Breath    HISTORY OF PRESENT ILLNESS:  Francisco Gibbs  is a 84 y.o. male who presents with about a week of malaise, with 3 to 4 days of increased cough and fever over the last 1 to 2 days.  Here in the ED was found to meet sepsis criteria and has multifocal pneumonia.  Hospitalist were called for admission  PAST MEDICAL HISTORY:   Past Medical History:  Diagnosis Date  . COPD (chronic obstructive pulmonary disease) ([place])   . Diabetes mellitus without complication ([place])    Pt takes Metformin.  Marland Kitchen GERD (gastroesophageal reflux disease)   . HLD (hyperlipidemia)   . Hypertension      PAST SURGICAL HISTORY:   Past Surgical History:  Procedure Laterality Date  . HYDROCELE EXCISION / REPAIR    . INGUINAL HERNIA REPAIR    . KNEE SURGERY       SOCIAL HISTORY:   Social History   Tobacco Use  . Smoking status: Former Research scientist (life sciences)  . Smokeless tobacco: Never Used  Substance Use Topics  . Alcohol use: Not Currently     FAMILY HISTORY:  Family history reviewed and is non-contributory.   DRUG ALLERGIES:  No Known Allergies  MEDICATIONS AT HOME:   Prior to Admission medications   Medication Sig Start Date End Date Taking? Authorizing Provider  ammonium lactate (AMLACTIN) 12 % cream Apply 1 application topically daily as needed for dry skin.  02/19/11  Yes [provider]  aspirin 81 MG chewable tablet Chew 1 tablet by mouth daily. 03/31/12  Yes [provider]  atorvastatin (LIPITOR) 80 MG tablet Take 1 tablet by mouth daily. 03/30/18  Yes [provider]  cetirizine (ZYRTEC) 10 MG tablet Take 10 mg by mouth  daily. 04/27/18  Yes [provider]  clorazepate (TRANXENE) 7.5 MG tablet Take 1 tablet by mouth daily. 04/27/18  Yes [provider]  cyclobenzaprine (FLEXERIL) 5 MG tablet Take 1 tablet by mouth at bedtime as needed for muscle spasms.  10/08/17  Yes [provider]  docusate sodium (COLACE) 100 MG capsule Take 1 capsule by mouth daily. 03/27/16  Yes [provider]  fluticasone (FLONASE) 50 MCG/ACT nasal spray Place 2 sprays into the nose daily.  04/29/17  Yes [provider]  furosemide (LASIX) 40 MG tablet Take 0.5 tablets (20 mg total) by mouth daily. Patient taking differently: Take 20 mg by mouth 2 (two) times daily.  04/29/18 04/29/19 Yes Epifanio Lesches, MD  gabapentin (NEURONTIN) 300 MG capsule Take 1 capsule by mouth 3 (three) times daily. 04/27/18  Yes [provider]  hydrALAZINE (APRESOLINE) 25 MG tablet Take 1 tablet (25 mg total) by mouth every 8 (eight) hours. 04/29/18  Yes Epifanio Lesches, MD  losartan (COZAAR) 50 MG tablet Take 1 tablet by mouth daily. 04/08/18  Yes [provider]  magnesium oxide (MAG-OX) 400 (241.3 Mg) MG tablet Take 1 tablet (400 mg total) by mouth daily. Patient taking differently: Take 400 mg by mouth 2 (two) times daily.  04/29/18  Yes Epifanio Lesches, MD  metFORMIN (GLUCOPHAGE-XR) 500 MG 24 hr tablet Take 1,000 mg  by mouth 2 (two) times daily. 11/29/17  Yes [provider]  metoprolol succinate (TOPROL-XL) 50 MG 24 hr tablet Take 50 mg by mouth daily. 03/29/18  Yes [provider]  omeprazole (PRILOSEC) 40 MG capsule Take 1 capsule by mouth daily. 03/30/18  Yes [provider]  quinapril (ACCUPRIL) 40 MG tablet Take 1 tablet by mouth daily. 03/29/18  Yes [provider]  SPIRIVA HANDIHALER 18 MCG inhalation capsule Place 1 capsule into inhaler and inhale daily. 03/30/18  Yes [provider]  terazosin (HYTRIN) 1 MG capsule Take 1 capsule by mouth  daily. 03/01/18  Yes [provider]  VENTOLIN HFA 108 (90 Base) MCG/ACT inhaler Inhale 2 puffs into the lungs every 4 (four) hours as needed for wheezing or shortness of breath.  03/30/18  Yes [provider]  Vitamin D, Ergocalciferol, (DRISDOL) 50000 units CAPS capsule Take 50,000 Units by mouth once a week. 05/28/18  Yes [provider]    REVIEW OF SYSTEMS:  Review of Systems  Constitutional: Positive for fever. Negative for chills, malaise/fatigue and weight loss.  HENT: Negative for ear pain, hearing loss and tinnitus.   Eyes: Negative for blurred vision, double vision, pain and redness.  Respiratory: Positive for cough and shortness of breath. Negative for hemoptysis.   Cardiovascular: Negative for chest pain, palpitations, orthopnea and leg swelling.  Gastrointestinal: Negative for abdominal pain, constipation, diarrhea, nausea and vomiting.  Genitourinary: Negative for dysuria, frequency and hematuria.  Musculoskeletal: Negative for back pain, joint pain and neck pain.  Skin:       No acne, rash, or lesions  Neurological: Negative for dizziness, tremors, focal weakness and weakness.  Endo/Heme/Allergies: Negative for polydipsia. Does not bruise/bleed easily.  Psychiatric/Behavioral: Negative for depression. The patient is not nervous/anxious and does not have insomnia.      VITAL SIGNS:   Vitals:   06/24/18 2330 06/25/18 0000 06/25/18 0030 06/25/18 0225  BP: (!) 151/55 (!) 143/104 (!) 136/46   Pulse: (!) 118 (!) 126 (!) 102   Resp: 20 18 17    Temp:    (!) 101 F (38.3 C)  TempSrc:    Oral  SpO2: 97% 99% 97%   Weight:      Height:       Wt Readings from Last 3 Encounters:  06/24/18 79.8 kg (176 lb)  04/29/18 73.9 kg (163 lb)    PHYSICAL EXAMINATION:  Physical Exam  Vitals reviewed. Constitutional: He is oriented to person, place, and time. He appears well-developed and well-nourished. No distress.  HENT:  Head: Normocephalic and  atraumatic.  Mouth/Throat: Oropharynx is clear and moist.  Eyes: Pupils are equal, round, and reactive to light. Conjunctivae and EOM are normal. No scleral icterus.  Neck: Normal range of motion. Neck supple. No JVD present. No thyromegaly present.  Cardiovascular: Regular rhythm and intact distal pulses. Exam reveals no gallop and no friction rub.  No murmur heard. Tachycardic  Respiratory: Effort normal. No respiratory distress. He has no wheezes. He has no rales.  Bilateral patchy rhonchi  GI: Soft. Bowel sounds are normal. He exhibits no distension. There is no tenderness.  Musculoskeletal: Normal range of motion. He exhibits no edema.  No arthritis, no gout  Lymphadenopathy:    He has no cervical adenopathy.  Neurological: He is alert and oriented to person, place, and time. No cranial nerve deficit.  No dysarthria, no aphasia  Skin: Skin is warm and dry. No rash noted. No erythema.  Psychiatric: He has a  normal mood and affect. His behavior is normal. Judgment and thought content normal.    LABORATORY PANEL:   CBC Recent Labs  Lab 06/24/18 2131  WBC 9.5  HGB 10.7*  HCT 31.9*  PLT 139*   ------------------------------------------------------------------------------------------------------------------  Chemistries  Recent Labs  Lab 06/24/18 2131  NA 138  K 4.1  CL 102  CO2 26  GLUCOSE 156*  BUN 23  CREATININE 1.14  CALCIUM 9.6  AST 30  ALT 24  ALKPHOS 58  BILITOT 0.9   ------------------------------------------------------------------------------------------------------------------  Cardiac Enzymes No results for input(s): TROPONINI in the last 168 hours. ------------------------------------------------------------------------------------------------------------------  RADIOLOGY:  Dg Chest 2 View  Result Date: 06/24/2018 CLINICAL DATA:  Weakness and short of breath EXAM: CHEST - 2 VIEW COMPARISON:  01/25/2018 FINDINGS: Bibasilar nodular densities are  most likely nipple shadows. Interstitial prominence is chronic. Normal heart size. No pneumothorax or pleural effusion. IMPRESSION: No active cardiopulmonary disease. Electronically Signed   By: Marybelle Killings M.D.   On: 06/24/2018 21:51   Ct Angio Chest Pe W And/or Wo Contrast  Result Date: 06/24/2018 CLINICAL DATA:  Chest pain, shortness of breath. EXAM: CT ANGIOGRAPHY CHEST WITH CONTRAST TECHNIQUE: Multidetector CT imaging of the chest was performed using the standard protocol during bolus administration of intravenous contrast. Multiplanar CT image reconstructions and MIPs were obtained to evaluate the vascular anatomy. CONTRAST:  62mL ISOVUE-370 IOPAMIDOL (ISOVUE-370) INJECTION 76% COMPARISON:  Radiographs of same day.  CT scan of October 28, 2017. FINDINGS: Cardiovascular: Satisfactory opacification of the pulmonary arteries to the segmental level. No evidence of pulmonary embolism. Normal heart size. No pericardial effusion. Atherosclerosis of thoracic aorta is noted without aneurysm or dissection. Mediastinum/Nodes: No enlarged mediastinal, hilar, or axillary lymph nodes. Thyroid gland, trachea, and esophagus demonstrate no significant findings. Lungs/Pleura: No pneumothorax or pleural effusion is noted. Emphysematous disease is noted in the upper lobes bilaterally. New irregular densities are noted in the right upper lobe most consistent with inflammation. Similar findings are noted in both lower lobes. Upper Abdomen: No acute abnormality. Musculoskeletal: No chest wall abnormality. No acute or significant osseous findings. Review of the MIP images confirms the above findings. IMPRESSION: No definite evidence of pulmonary embolus. Multifocal opacities are noted throughout both lungs most consistent with pneumonia. Aortic Atherosclerosis (ICD10-I70.0) and Emphysema (ICD10-J43.9). Electronically Signed   By: Marijo Conception, M.D.   On: 06/24/2018 23:22    EKG:   Orders placed or performed during the  hospital encounter of 06/24/18  . EKG 12-Lead  . EKG 12-Lead  . ED EKG 12-Lead  . ED EKG 12-Lead    IMPRESSION AND PLAN:  Principal Problem:   CAP (community acquired pneumonia) -seen on imaging, broad-spectrum antibiotics in place Active Problems:   Sepsis ([place]) -hemodynamically stable, lactic acid within normal limits, antibiotics started, culture sent   HTN (hypertension) -continue home meds   COPD (chronic obstructive pulmonary disease) ([facility]) -home dose inhalers   Diabetes ([facility]) -sliding scale insulin with corresponding glucose checks   HLD (hyperlipidemia) -Home dose antilipid   GERD (gastroesophageal reflux disease) -home dose PPI  Chart review performed and case discussed with ED provider. Labs, imaging and/or ECG reviewed by provider and discussed with patient/family. Management plans discussed with the patient and/or family.  DVT PROPHYLAXIS: SubQ lovenox  GI PROPHYLAXIS: PPI  ADMISSION STATUS: Inpatient  CODE STATUS: Full Code Status History    Date Active Date Inactive Code Status Order ID Comments User Context   04/28/2018 1443 04/29/2018 1807 Full Code [id number]  Vianne Bulls,  Lise Auer, MD ED      TOTAL TIME TAKING CARE OF THIS PATIENT: 45 minutes.   Josselyne Onofrio [place] 06/25/2018, 2:36 AM  [facility]  Office  [phone]  CC: Primary care physician; [name], MD  Note:  This document was prepared using Dragon voice recognition software and may include unintentional dictation errors.

## 2018-06-25 NOTE — ED Notes (Signed)
Pt has a productive cough with white sputum.

## 2018-06-26 LAB — GLUCOSE, CAPILLARY
GLUCOSE-CAPILLARY: 131 mg/dL — AB (ref 70–99)
GLUCOSE-CAPILLARY: 199 mg/dL — AB (ref 70–99)
Glucose-Capillary: 114 mg/dL — ABNORMAL HIGH (ref 70–99)
Glucose-Capillary: 123 mg/dL — ABNORMAL HIGH (ref 70–99)

## 2018-06-26 NOTE — Progress Notes (Signed)
[facility] [place] NAME: Francisco Gibbs    MR#:  [id number]  DATE OF BIRTH:  Mar 28, 1933  SUBJECTIVE:   Patient admitted to the hospital due to weakness, shortness of breath and noted to have multifocal pneumonia on CT chest.  Still complaining of some cough and weakness.  Patient's sister is at bedside.  No other acute events overnight.  REVIEW OF SYSTEMS:    Review of Systems  Constitutional: Negative for chills and fever.  HENT: Negative for congestion and tinnitus.   Eyes: Negative for blurred vision and double vision.  Respiratory: Positive for shortness of breath. Negative for cough and wheezing.   Cardiovascular: Negative for chest pain, orthopnea and PND.  Gastrointestinal: Negative for abdominal pain, diarrhea, nausea and vomiting.  Genitourinary: Negative for dysuria and hematuria.  Neurological: Positive for weakness (generalized). Negative for dizziness, sensory change and focal weakness.  All other systems reviewed and are negative.   Nutrition: Heart Healthy/Carb modified Tolerating Diet: Yes Tolerating PT: Eval noted.   DRUG ALLERGIES:  No Known Allergies  VITALS:  Blood pressure (!) 147/55, pulse 84, temperature 98 F (36.7 C), resp. rate 20, height 5\' 6"  (1.676 m), weight 79.8 kg (176 lb), SpO2 100 %.  PHYSICAL EXAMINATION:   Physical Exam  GENERAL:  84 y.o.-year-old patient sitting up in chair in no acute distress.  EYES: Pupils equal, round, reactive to light and accommodation. No scleral icterus. Extraocular muscles intact.  HEENT: Head atraumatic, normocephalic. Oropharynx and nasopharynx clear.  NECK:  Supple, no jugular venous distention. No thyroid enlargement, no tenderness.  LUNGS: Normal breath sounds bilaterally, no wheezing, minimal rhonchi b/l. No use of accessory muscles of respiration.  CARDIOVASCULAR: S1, S2 normal. No murmurs, rubs, or gallops.  ABDOMEN: Soft, nontender, nondistended. Bowel sounds  present. No organomegaly or mass.  EXTREMITIES: No cyanosis, clubbing or edema b/l.    NEUROLOGIC: Cranial nerves II through XII are intact. No focal Motor or sensory deficits b/l.  Globally weak.  PSYCHIATRIC: The patient is alert and oriented x 3.  SKIN: No obvious rash, lesion, or ulcer.    LABORATORY PANEL:   CBC Recent Labs  Lab 06/25/18 0453  WBC 6.9  HGB 8.8*  HCT 26.1*  PLT 120*   ------------------------------------------------------------------------------------------------------------------  Chemistries  Recent Labs  Lab 06/24/18 2131 06/25/18 0453  NA 138 135  K 4.1 4.0  CL 102 104  CO2 26 25  GLUCOSE 156* 140*  BUN 23 22  CREATININE 1.14 1.11  1.21  CALCIUM 9.6 8.7*  MG 1.2*  --   AST 30  --   ALT 24  --   ALKPHOS 58  --   BILITOT 0.9  --    ------------------------------------------------------------------------------------------------------------------  Cardiac Enzymes No results for input(s): TROPONINI in the last 168 hours. ------------------------------------------------------------------------------------------------------------------  RADIOLOGY:  Dg Chest 2 View  Result Date: 06/24/2018 CLINICAL DATA:  Weakness and short of breath EXAM: CHEST - 2 VIEW COMPARISON:  01/25/2018 FINDINGS: Bibasilar nodular densities are most likely nipple shadows. Interstitial prominence is chronic. Normal heart size. No pneumothorax or pleural effusion. IMPRESSION: No active cardiopulmonary disease. Electronically Signed   By: Marybelle Killings M.D.   On: 06/24/2018 21:51   Ct Angio Chest Pe W And/or Wo Contrast  Result Date: 06/24/2018 CLINICAL DATA:  Chest pain, shortness of breath. EXAM: CT ANGIOGRAPHY CHEST WITH CONTRAST TECHNIQUE: Multidetector CT imaging of the chest was performed using the standard protocol during bolus administration of intravenous contrast. Multiplanar CT  image reconstructions and MIPs were obtained to evaluate the vascular anatomy. CONTRAST:   27mL ISOVUE-370 IOPAMIDOL (ISOVUE-370) INJECTION 76% COMPARISON:  Radiographs of same day.  CT scan of October 28, 2017. FINDINGS: Cardiovascular: Satisfactory opacification of the pulmonary arteries to the segmental level. No evidence of pulmonary embolism. Normal heart size. No pericardial effusion. Atherosclerosis of thoracic aorta is noted without aneurysm or dissection. Mediastinum/Nodes: No enlarged mediastinal, hilar, or axillary lymph nodes. Thyroid gland, trachea, and esophagus demonstrate no significant findings. Lungs/Pleura: No pneumothorax or pleural effusion is noted. Emphysematous disease is noted in the upper lobes bilaterally. New irregular densities are noted in the right upper lobe most consistent with inflammation. Similar findings are noted in both lower lobes. Upper Abdomen: No acute abnormality. Musculoskeletal: No chest wall abnormality. No acute or significant osseous findings. Review of the MIP images confirms the above findings. IMPRESSION: No definite evidence of pulmonary embolus. Multifocal opacities are noted throughout both lungs most consistent with pneumonia. Aortic Atherosclerosis (ICD10-I70.0) and Emphysema (ICD10-J43.9). Electronically Signed   By: Marijo Conception, M.D.   On: 06/24/2018 23:22     ASSESSMENT AND PLAN:   84 year old male with past medical history of Essential HTN, hyperlipidemia, COPD, diabetes, GERD who presents to the hospital due to shortness of breath, fever cough and noted to have CT chest findings suggestive of multifocal pneumonia.  1.  Acute respiratory failure with hypoxia-secondary to underlying COPD and now with multifocal pneumonia. - Continue O2 supplementation, continue IV antibiotics with ceftriaxone, Zithromax for the pneumonia.  2.  Multifocal pneumonia-this is a cause of patient's worsening shortness of breath and hypoxia. -Continue IV ceftriaxone, Zithromax.  Follow cultures.  3.  COPD- mild acute exacerbation secondary to the  underlying pneumonia. -Continue scheduled duo nebs, Pulmicort nebs.  4.  Diabetes type 2 without complication-continue sliding scale insulin.  Follow blood sugars per protocol.  5.  Essential hypertension-continue Toprol  6.  Hyperlipidemia-continue atorvastatin.  7.  GERD-continue Protonix.  8. BPH - cont. Hytrin.    All the records are reviewed and case discussed with Care Management/Social Worker. Management plans discussed with the patient, family and they are in agreement.  CODE STATUS: Full code  DVT Prophylaxis: Lovenox  TOTAL TIME TAKING CARE OF THIS PATIENT: 30 minutes.   POSSIBLE D/C IN 1-2 DAYS, DEPENDING ON CLINICAL CONDITION.   [name] M.D on 06/26/2018 at 2:14 PM  Between 7am to 6pm - Pager - [phone]  After 6pm go to [email or web address] - [employer] [facility]  Office  [phone]  CC: Primary care physician; [name], MD

## 2018-06-26 NOTE — Evaluation (Signed)
Physical Therapy Evaluation Patient Details Name: Francisco Gibbs MRN: [id number] DOB: 1933-08-31 Today's Date: 06/26/2018   History of Present Illness  Patient is an 84 year old male admitted from home with hypoxia, COPD exacerbation, fever. Patient diagnosed with PNA.       Clinical Impression  Patient agreeable to PT. Limited by respiratory status, coughing deeply throughout session. Patient able to ambulate 25 feet without ad, cga. Supplemental O2 removed for walking in room, sats dropped to 86%. Supplemental O2 returned to patient with sats returning to 95% Patient will benefit from continued PT while in hospital to improve activity tolerance and independence with mobility for return to prior functional level.     Follow Up Recommendations Home health PT    Equipment Recommendations       Recommendations for Other Services       Precautions / Restrictions Precautions Precautions: Fall Restrictions Weight Bearing Restrictions: No      Mobility  Bed Mobility Overal bed mobility: Modified Independent             General bed mobility comments: requires min assist for supine to sit- raising trunk to seated position  Transfers Overall transfer level: Modified independent Equipment used: 1 person hand held assist                Ambulation/Gait Ambulation/Gait assistance: Min guard Gait Distance (Feet): 25 Feet Assistive device: None Gait Pattern/deviations: Step-to pattern Gait velocity: decreased   General Gait Details: patient ambulated with cga, no lob.  Stairs            Wheelchair Mobility    Modified Rankin (Stroke Patients Only)       Balance Overall balance assessment: Modified Independent Sitting-balance support: Single extremity supported   Sitting balance - Comments: requires single hand on rail while seated   Standing balance support: No upper extremity supported   Standing balance comment: patient able to stand independently with  cga for safety.                             Pertinent Vitals/Pain Pain Assessment: No/denies pain    Home Living Family/patient expects to be discharged to:: Private residence Living Arrangements: Alone Available Help at Discharge: Family Type of Home: Mobile home Home Access: Stairs to enter   Technical brewer of Steps: 2 Home Layout: One level        Prior Function Level of Independence: Independent               Hand Dominance        Extremity/Trunk Assessment   Upper Extremity Assessment Upper Extremity Assessment: Overall WFL for tasks assessed    Lower Extremity Assessment Lower Extremity Assessment: Overall WFL for tasks assessed       Communication   Communication: No difficulties  Cognition Arousal/Alertness: Awake/alert Behavior During Therapy: WFL for tasks assessed/performed Overall Cognitive Status: Within Functional Limits for tasks assessed                                        General Comments      Exercises     Assessment/Plan    PT Assessment Patient needs continued PT services  PT Problem List Decreased strength;Decreased activity tolerance       PT Treatment Interventions Gait training;Functional mobility training;Therapeutic exercise;Patient/family education;Therapeutic activities    PT  Goals (Current goals can be found in the Care Plan section)  Acute Rehab PT Goals Patient Stated Goal: to return home, get back to playing music and singing PT Goal Formulation: With patient Time For Goal Achievement: 07/10/18 Potential to Achieve Goals: Good    Frequency Min 2X/week   Barriers to discharge   lives alone, sister present reporting patient cannot go back to mobile home for reasons she did not elaborate on with me.     Co-evaluation               AM-PAC PT "6 Clicks" Daily Activity  Outcome Measure Difficulty turning over in bed (including adjusting bedclothes, sheets and  blankets)?: Unable Difficulty moving from lying on back to sitting on the side of the bed? : Unable Difficulty sitting down on and standing up from a chair with arms (e.g., wheelchair, bedside commode, etc,.)?: Unable Help needed moving to and from a bed to chair (including a wheelchair)?: A Little Help needed walking in hospital room?: A Little Help needed climbing 3-5 steps with a railing? : A Lot 6 Click Score: 11    End of Session Equipment Utilized During Treatment: Gait belt;Oxygen Activity Tolerance: Patient limited by fatigue;Other (comment)(patient limited by respiratory status, deep coughing throughout session. ) Patient left: in chair;with chair alarm set;with family/visitor present;with call bell/phone within reach Nurse Communication: Mobility status PT Visit Diagnosis: Muscle weakness (generalized) (M62.81);Difficulty in walking, not elsewhere classified (R26.2)    Time: 7673-4193 PT Time Calculation (min) (ACUTE ONLY): 27 min   Charges:   PT Evaluation $PT Eval Moderate Complexity: 1 Mod          Starsky Nanna, PT, GCS 06/26/18,10:53 AM

## 2018-06-27 LAB — CBC
HEMATOCRIT: 26.4 % — AB (ref 40.0–52.0)
Hemoglobin: 9 g/dL — ABNORMAL LOW (ref 13.0–18.0)
MCH: 30.2 pg (ref 26.0–34.0)
MCHC: 34.3 g/dL (ref 32.0–36.0)
MCV: 88.3 fL (ref 80.0–100.0)
Platelets: 147 10*3/uL — ABNORMAL LOW (ref 150–440)
RBC: 2.99 MIL/uL — ABNORMAL LOW (ref 4.40–5.90)
RDW: 13.9 % (ref 11.5–14.5)
WBC: 5.2 10*3/uL (ref 3.8–10.6)

## 2018-06-27 LAB — BASIC METABOLIC PANEL
ANION GAP: 6 (ref 5–15)
BUN: 16 mg/dL (ref 8–23)
CO2: 30 mmol/L (ref 22–32)
Calcium: 9.1 mg/dL (ref 8.9–10.3)
Chloride: 102 mmol/L (ref 98–111)
Creatinine, Ser: 0.91 mg/dL (ref 0.61–1.24)
GFR calc non Af Amer: >60 mL/min (ref >60)
GLUCOSE: 141 mg/dL — AB (ref 70–99)
Potassium: 3.6 mmol/L (ref 3.5–5.1)
Sodium: 138 mmol/L (ref 135–145)

## 2018-06-27 LAB — GLUCOSE, CAPILLARY
GLUCOSE-CAPILLARY: 155 mg/dL — AB (ref 70–99)
GLUCOSE-CAPILLARY: 144 mg/dL — AB (ref 70–99)
Glucose-Capillary: 131 mg/dL — ABNORMAL HIGH (ref 70–99)
Glucose-Capillary: 154 mg/dL — ABNORMAL HIGH (ref 70–99)

## 2018-06-27 MED ORDER — IPRATROPIUM-ALBUTEROL 0.5-2.5 (3) MG/3ML IN SOLN
3.0000 mL | Freq: Three times a day (TID) | RESPIRATORY_TRACT | Status: DC
  Administered 2018-06-27 – 2018-06-28 (×3): 3 mL via RESPIRATORY_TRACT
  Filled 2018-06-27 (×3): qty 3

## 2018-06-27 MED ORDER — IPRATROPIUM-ALBUTEROL 0.5-2.5 (3) MG/3ML IN SOLN: 3.0000 mL | RESPIRATORY_TRACT | Status: DC | PRN

## 2018-06-27 NOTE — Progress Notes (Signed)
[facility] [place] NAME: Francisco Gibbs    MR#:  [id number]  DATE OF BIRTH:  1933-09-22  SUBJECTIVE:   Feels a little bit better since yesterday, shortness of breath is improved.  Still has a cough which is productive intermittently.  No other acute events overnight.  Seen by physical therapy and the recommend home health services.  REVIEW OF SYSTEMS:    Review of Systems  Constitutional: Negative for chills and fever.  HENT: Negative for congestion and tinnitus.   Eyes: Negative for blurred vision and double vision.  Respiratory: Positive for shortness of breath. Negative for cough and wheezing.   Cardiovascular: Negative for chest pain, orthopnea and PND.  Gastrointestinal: Negative for abdominal pain, diarrhea, nausea and vomiting.  Genitourinary: Negative for dysuria and hematuria.  Neurological: Positive for weakness (generalized). Negative for dizziness, sensory change and focal weakness.  All other systems reviewed and are negative.   Nutrition: Heart Healthy/Carb modified Tolerating Diet: Yes Tolerating PT: Eval noted.   DRUG ALLERGIES:  No Known Allergies  VITALS:  Blood pressure 120/60, pulse 73, temperature 98.2 F (36.8 C), temperature source Oral, resp. rate 18, height 5\' 6"  (1.676 m), weight 79.8 kg (176 lb), SpO2 91 %.  PHYSICAL EXAMINATION:   Physical Exam  GENERAL:  84 y.o.-year-old patient sitting up in chair in no acute distress.  EYES: Pupils equal, round, reactive to light and accommodation. No scleral icterus. Extraocular muscles intact.  HEENT: Head atraumatic, normocephalic. Oropharynx and nasopharynx clear.  NECK:  Supple, no jugular venous distention. No thyroid enlargement, no tenderness.  LUNGS: Normal breath sounds bilaterally, no wheezing, minimal rhonchi b/l. No use of accessory muscles of respiration.  CARDIOVASCULAR: S1, S2 normal. No murmurs, rubs, or gallops.  ABDOMEN: Soft, nontender,  nondistended. Bowel sounds present. No organomegaly or mass.  EXTREMITIES: No cyanosis, clubbing or edema b/l.    NEUROLOGIC: Cranial nerves II through XII are intact. No focal Motor or sensory deficits b/l.  Globally weak.  PSYCHIATRIC: The patient is alert and oriented x 3.  SKIN: No obvious rash, lesion, or ulcer.    LABORATORY PANEL:   CBC Recent Labs  Lab 06/27/18 0558  WBC 5.2  HGB 9.0*  HCT 26.4*  PLT 147*   ------------------------------------------------------------------------------------------------------------------  Chemistries  Recent Labs  Lab 06/24/18 2131  06/27/18 0558  NA 138   < > 138  K 4.1   < > 3.6  CL 102   < > 102  CO2 26   < > 30  GLUCOSE 156*   < > 141*  BUN 23   < > 16  CREATININE 1.14   < > 0.91  CALCIUM 9.6   < > 9.1  MG 1.2*  --   --   AST 30  --   --   ALT 24  --   --   ALKPHOS 58  --   --   BILITOT 0.9  --   --    < > = values in this interval not displayed.   ------------------------------------------------------------------------------------------------------------------  Cardiac Enzymes No results for input(s): TROPONINI in the last 168 hours. ------------------------------------------------------------------------------------------------------------------  RADIOLOGY:  No results found.   ASSESSMENT AND PLAN:   84 year old male with past medical history of Essential HTN, hyperlipidemia, COPD, diabetes, GERD who presents to the hospital due to shortness of breath, fever cough and noted to have CT chest findings suggestive of multifocal pneumonia.  1.  Acute respiratory failure with hypoxia-secondary to underlying  COPD and now with multifocal pneumonia. - Continue O2 supplementation, continue IV antibiotics with ceftriaxone, Zithromax for the pneumonia. - improving and will ambulate and assess for Home O2.   2.  Multifocal pneumonia-this is a cause of patient's worsening shortness of breath and hypoxia. -Continue IV  ceftriaxone, Zithromax.   - afebrile and cultures so far are (-).   3.  COPD- mild acute exacerbation secondary to the underlying pneumonia. -Continue scheduled duo nebs, Pulmicort nebs.  4.  Diabetes type 2 without complication-continue sliding scale insulin.   - BS Stable.   5.  Essential hypertension-continue Toprol  6.  Hyperlipidemia-continue atorvastatin.  7.  GERD-continue Protonix.  8. BPH - cont. Hytrin.   Seen by PT and they recommend Home health and will arrange prior to discharge.   All the records are reviewed and case discussed with Care Management/Social Worker. Management plans discussed with the patient, family and they are in agreement.  CODE STATUS: Full code  DVT Prophylaxis: Lovenox  TOTAL TIME TAKING CARE OF THIS PATIENT: 25 minutes.   POSSIBLE D/C IN 1-2 DAYS, DEPENDING ON CLINICAL CONDITION.   [name] M.D on 06/27/2018 at 2:37 PM  Between 7am to 6pm - Pager - [phone]  After 6pm go to [email or web address] - [employer] [facility]  Office  [phone]  CC: Primary care physician; [name], MD

## 2018-06-28 LAB — GLUCOSE, CAPILLARY
GLUCOSE-CAPILLARY: 139 mg/dL — AB (ref 70–99)
GLUCOSE-CAPILLARY: 256 mg/dL — AB (ref 70–99)

## 2018-06-28 MED ORDER — LEVOFLOXACIN 500 MG PO TABS: 500.0000 mg | ORAL_TABLET | Freq: Every day | ORAL | 0 refills | Status: AC

## 2018-06-28 MED ORDER — AZITHROMYCIN 250 MG PO TABS: 500.0000 mg | ORAL_TABLET | Freq: Every day | ORAL | Status: DC

## 2018-06-28 MED ORDER — IPRATROPIUM-ALBUTEROL 0.5-2.5 (3) MG/3ML IN SOLN: 3.0000 mL | Freq: Four times a day (QID) | RESPIRATORY_TRACT | 0 refills | Status: DC | PRN

## 2018-06-28 NOTE — Care Management Important Message (Signed)
Copy of signed IM left with patient in room.  

## 2018-06-28 NOTE — Discharge Summary (Signed)
Francisco Gibbs    MR#:  [id number]  DATE OF BIRTH:  1932/12/22  DATE OF ADMISSION:  06/24/2018 ADMITTING PHYSICIAN: Lance Coon, MD  DATE OF DISCHARGE: 06/28/2018  PRIMARY CARE PHYSICIAN: [name], MD    ADMISSION DIAGNOSIS:  Hypoxia [R09.02] COPD exacerbation ([place]) [J44.1] Fever, unspecified fever cause [R50.9]  DISCHARGE DIAGNOSIS:  Principal Problem:   CAP (community acquired pneumonia) Active Problems:   Sepsis ([place])   HTN (hypertension)   HLD (hyperlipidemia)   GERD (gastroesophageal reflux disease)   COPD (chronic obstructive pulmonary disease) ([place])   Diabetes ([place])   SECONDARY DIAGNOSIS:   Past Medical History:  Diagnosis Date  . COPD (chronic obstructive pulmonary disease) ([place])   . Diabetes mellitus without complication ([place])    Pt takes Metformin.  Marland Kitchen GERD (gastroesophageal reflux disease)   . HLD (hyperlipidemia)   . Hypertension     HOSPITAL COURSE:   84 year old male with past medical history of Essential HTN, hyperlipidemia, COPD, diabetes, GERD who presents to the hospital due to shortness of breath, fever cough and noted to have CT chest findings suggestive of multifocal pneumonia.  1.  Acute respiratory failure with hypoxia-secondary to underlying COPD and  multifocal pneumonia. -Patient was placed on oxygen, given IV antibiotics with ceftriaxone and Zithromax for pneumonia. - He has improved, he did qualify for home oxygen which was arranged for him prior to discharge.  2.  Multifocal pneumonia-this was the cause of patient's worsening shortness of breath and hypoxia. In the hospital patient was treated with IV Zithromax, ceftriaxone, his cultures remain negative.  He is currently afebrile and hemodynamically stable and therefore being discharged on oral Levaquin.  3.  COPD- mild acute exacerbation secondary to the underlying pneumonia. -Patient was treated with scheduled  duo nebs, Pulmicort nebs and is improved.  He will be discharged on home oxygen also with a new nebulizer and duo nebs as needed along with maintenance of his inhalers.  4.  Diabetes type 2 without complication- on the hospital patient was on sliding scale insulin, but now being discharged back on his oral metformin.    5.  Essential hypertension- patient will continue his Toprol, quinapril.  6.  Hyperlipidemia- pt. Will continue atorvastatin.  7.  GERD- Pt. Will cont. His Omeprazole.   8. BPH - pt. Will cont. Hytrin.    DISCHARGE CONDITIONS:   Stable  CONSULTS OBTAINED:    DRUG ALLERGIES:  No Known Allergies  DISCHARGE MEDICATIONS:   Allergies as of 06/28/2018   No Known Allergies     Medication List    STOP taking these medications   hydrALAZINE 25 MG tablet Commonly known as:  APRESOLINE   losartan 50 MG tablet Commonly known as:  COZAAR     TAKE these medications   ammonium lactate 12 % cream Commonly known as:  AMLACTIN Apply 1 application topically daily as needed for dry skin.   aspirin 81 MG chewable tablet Chew 1 tablet by mouth daily.   atorvastatin 80 MG tablet Commonly known as:  LIPITOR Take 1 tablet by mouth daily.   cetirizine 10 MG tablet Commonly known as:  ZYRTEC Take 10 mg by mouth daily.   clorazepate 7.5 MG tablet Commonly known as:  TRANXENE Take 1 tablet by mouth daily.   COLACE 100 MG capsule Generic drug:  docusate sodium Take 1 capsule by mouth daily.   cyclobenzaprine 5 MG tablet Commonly known as:  FLEXERIL  Take 1 tablet by mouth at bedtime as needed for muscle spasms.   fluticasone 50 MCG/ACT nasal spray Commonly known as:  FLONASE Place 2 sprays into the nose daily.   furosemide 40 MG tablet Commonly known as:  LASIX Take 0.5 tablets (20 mg total) by mouth daily. What changed:  when to take this   gabapentin 300 MG capsule Commonly known as:  NEURONTIN Take 1 capsule by mouth 3 (three) times daily.    ipratropium-albuterol 0.5-2.5 (3) MG/3ML Soln Commonly known as:  DUONEB Take 3 mLs by nebulization every 6 (six) hours as needed.   levofloxacin 500 MG tablet Commonly known as:  LEVAQUIN Take 1 tablet (500 mg total) by mouth daily for 7 days.   magnesium oxide 400 (241.3 Mg) MG tablet Commonly known as:  MAG-OX Take 1 tablet (400 mg total) by mouth daily. What changed:  when to take this   metFORMIN 500 MG 24 hr tablet Commonly known as:  GLUCOPHAGE-XR Take 1,000 mg by mouth 2 (two) times daily.   metoprolol succinate 50 MG 24 hr tablet Commonly known as:  TOPROL-XL Take 50 mg by mouth daily.   omeprazole 40 MG capsule Commonly known as:  PRILOSEC Take 1 capsule by mouth daily.   quinapril 40 MG tablet Commonly known as:  ACCUPRIL Take 1 tablet by mouth daily.   SPIRIVA HANDIHALER 18 MCG inhalation capsule Generic drug:  tiotropium Place 1 capsule into inhaler and inhale daily.   terazosin 1 MG capsule Commonly known as:  HYTRIN Take 1 capsule by mouth daily.   VENTOLIN HFA 108 (90 Base) MCG/ACT inhaler Generic drug:  albuterol Inhale 2 puffs into the lungs every 4 (four) hours as needed for wheezing or shortness of breath.   Vitamin D (Ergocalciferol) 50000 units Caps capsule Commonly known as:  DRISDOL Take 50,000 Units by mouth once a week.            Durable Medical Equipment  (From admission, onward)        Start     Ordered   06/28/18 1028  DME Oxygen  Once    Question Answer Comment  Mode or (Route) Nasal cannula   Liters per Minute 2   Frequency Continuous (stationary and portable oxygen unit needed)   Oxygen conserving device Yes   Oxygen delivery system Gas      06/28/18 1028   06/28/18 0000  DME Nebulizer machine    Question:  Patient needs a nebulizer to treat with the following condition  Answer:  COPD (chronic obstructive pulmonary disease) ([place])   06/28/18 1028        DISCHARGE INSTRUCTIONS:   DIET:  Cardiac diet and  Diabetic diet  DISCHARGE CONDITION:  Stable  ACTIVITY:  Activity as tolerated  OXYGEN:  Home Oxygen: Yes.     Oxygen Delivery: 2 liters/min via Patient connected to nasal cannula oxygen  DISCHARGE LOCATION:  Home with home health nursing, physical therapy, aide, social work  If you experience worsening of your admission symptoms, develop shortness of breath, life threatening emergency, suicidal or homicidal thoughts you must seek medical attention immediately by calling 911 or calling your MD immediately  if symptoms less severe.  You Must read complete instructions/literature along with all the possible adverse reactions/side effects for all the Medicines you take and that have been prescribed to you. Take any new Medicines after you have completely understood and accpet all the possible adverse reactions/side effects.   Please note  You were cared  for by a hospitalist during your hospital stay. If you have any questions about your discharge medications or the care you received while you were in the hospital after you are discharged, you can call the unit and asked to speak with the hospitalist on call if the hospitalist that took care of you is not available. Once you are discharged, your primary care physician will handle any further medical issues. Please note that NO REFILLS for any discharge medications will be authorized once you are discharged, as it is imperative that you return to your primary care physician (or establish a relationship with a primary care physician if you do not have one) for your aftercare needs so that they can reassess your need for medications and monitor your lab values.     Today   Shortness of breath improved since admission.  Patient is afebrile and hemodynamically stable.  Will discharge home with home health services today.  VITAL SIGNS:  Blood pressure (!) 145/80, pulse 80, temperature 98 F (36.7 C), temperature source Oral, resp. rate 16,  height 5\' 6"  (1.676 m), weight 79.8 kg (176 lb), SpO2 99 %.  I/O:    Intake/Output Summary (Last 24 hours) at 06/28/2018 1410 Last data filed at 06/28/2018 0443 Gross per 24 hour  Intake -  Output 700 ml  Net -700 ml    PHYSICAL EXAMINATION:   GENERAL:  84 y.o.-year-old patient sitting up in chair in no acute distress.  EYES: Pupils equal, round, reactive to light and accommodation. No scleral icterus. Extraocular muscles intact.  HEENT: Head atraumatic, normocephalic. Oropharynx and nasopharynx clear.  NECK:  Supple, no jugular venous distention. No thyroid enlargement, no tenderness.  LUNGS: Normal breath sounds bilaterally, no wheezing, minimal rhonchi b/l. No use of accessory muscles of respiration.  CARDIOVASCULAR: S1, S2 normal. No murmurs, rubs, or gallops.  ABDOMEN: Soft, nontender, nondistended. Bowel sounds present. No organomegaly or mass.  EXTREMITIES: No cyanosis, clubbing or edema b/l.    NEUROLOGIC: Cranial nerves II through XII are intact. No focal Motor or sensory deficits b/l.  Globally weak.  PSYCHIATRIC: The patient is alert and oriented x 3.  SKIN: No obvious rash, lesion, or ulcer.  DATA REVIEW:   CBC Recent Labs  Lab 06/27/18 0558  WBC 5.2  HGB 9.0*  HCT 26.4*  PLT 147*    Chemistries  Recent Labs  Lab 06/24/18 2131  06/27/18 0558  NA 138   < > 138  K 4.1   < > 3.6  CL 102   < > 102  CO2 26   < > 30  GLUCOSE 156*   < > 141*  BUN 23   < > 16  CREATININE 1.14   < > 0.91  CALCIUM 9.6   < > 9.1  MG 1.2*  --   --   AST 30  --   --   ALT 24  --   --   ALKPHOS 58  --   --   BILITOT 0.9  --   --    < > = values in this interval not displayed.    Cardiac Enzymes No results for input(s): TROPONINI in the last 168 hours.  Microbiology Results  Results for orders placed or performed during the hospital encounter of 06/24/18  Culture, blood (Routine x 2)     Status: None (Preliminary result)   Collection Time: 06/24/18  9:29 PM  Result Value  Ref Range Status   Specimen Description BLOOD LEFT ANTECUBITAL  Final   Special Requests   Final    BOTTLES DRAWN AEROBIC AND ANAEROBIC Blood Culture adequate volume   Culture   Final    NO GROWTH 4 DAYS Performed at [facility], [address] [place], [place] [address]    Report Status PENDING  Incomplete  Culture, blood (Routine x 2)     Status: None (Preliminary result)   Collection Time: 06/24/18  9:31 PM  Result Value Ref Range Status   Specimen Description BLOOD BLOOD RIGHT FOREARM  Final   Special Requests   Final    BOTTLES DRAWN AEROBIC AND ANAEROBIC Blood Culture adequate volume   Culture   Final    NO GROWTH 4 DAYS Performed at [facility], [address], [place], [place] [address]    Report Status PENDING  Incomplete    RADIOLOGY:  No results found.    Management plans discussed with the patient, family and they are in agreement.  CODE STATUS:     Code Status Orders  (From admission, onward)        Start     Ordered   06/25/18 0402  Full code  Continuous     06/25/18 0401    Code Status History    Date Active Date Inactive Code Status Order ID Comments User Context   04/28/2018 1443 04/29/2018 1807 Full Code [id number]  Epifanio Lesches, MD ED     TOTAL TIME TAKING CARE OF THIS PATIENT: 40 minutes.    [name] M.D on 06/28/2018 at 2:10 PM  Between 7am to 6pm - Pager - [phone]  After 6pm go to [email or web address] - Technical [employer] [facility]  Office  [phone]  CC: Primary care physician; [name], MD

## 2018-06-28 NOTE — Progress Notes (Signed)
PHARMACIST - PHYSICIAN COMMUNICATION DR:  Verdell Carmine CONCERNING: Antibiotic IV to Oral Route Change Policy  RECOMMENDATION: This patient is receiving azithromycin by the intravenous route.  Based on criteria approved by the Pharmacy and Therapeutics Committee, the antibiotic(s) is/are being converted to the equivalent oral dose form(s).   DESCRIPTION: These criteria include:  Patient being treated for a respiratory tract infection, urinary tract infection, cellulitis or clostridium difficile associated diarrhea if on metronidazole  The patient is not neutropenic and does not exhibit a GI malabsorption state  The patient is eating (either orally or via tube) and/or has been taking other orally administered medications for a least 24 hours  The patient is improving clinically and has a Tmax < 100.5  If you have questions about this conversion, please contact the Pharmacy Department

## 2018-06-28 NOTE — Progress Notes (Signed)
Physical Therapy Treatment Patient Details Name: Francisco Gibbs MRN: [id number] DOB: 12-25-1932 Today's Date: 06/28/2018    History of Present Illness Patient is an 84 year old male admitted from home with hypoxia, COPD exacerbation, fever. Patient diagnosed with PNA.         PT Comments    Patient demonstrates independence with bed mobility this visit, requires cga for ambulation in hallway. Patient is slightly unsteady out in hall, reaching for counter occasionally to steady himself. Patient has spc at home that he plans to use. Patient requiring home O2, as resting O2 sats on room air fluctuating, but down to 86%. Ambulated on 2 lpm O2 with sats at 91-92%.      Follow Up Recommendations  Home health PT     Equipment Recommendations  None recommended by PT    Recommendations for Other Services       Precautions / Restrictions Precautions Precautions: Fall Restrictions Weight Bearing Restrictions: No    Mobility  Bed Mobility Overal bed mobility: Independent             General bed mobility comments: supervision for supine>< sit  Transfers Overall transfer level: Modified independent               General transfer comment: patient able to transfer independently with supervision, increased time required.   Ambulation/Gait Ambulation/Gait assistance: Modified independent (Device/Increase time);Min guard Gait Distance (Feet): 180 Feet Assistive device: None Gait Pattern/deviations: Step-through pattern;Drifts right/left Gait velocity: decreased   General Gait Details: patient unsteady with ambulation, reaching out for counter in hallway at times. CGA only.    Stairs             Wheelchair Mobility    Modified Rankin (Stroke Patients Only)       Balance Overall balance assessment: Mild deficits observed, not formally tested Sitting-balance support: Feet supported Sitting balance-Leahy Scale: Good     Standing balance support: No upper  extremity supported Standing balance-Leahy Scale: Fair Standing balance comment: patient unsteady with standing. CGA for safety                              Cognition Arousal/Alertness: Awake/alert Behavior During Therapy: WFL for tasks assessed/performed Overall Cognitive Status: Within Functional Limits for tasks assessed                                        Exercises      General Comments        Pertinent Vitals/Pain Pain Assessment: No/denies pain    Home Living                      Prior Function            PT Goals (current goals can now be found in the care plan section) Acute Rehab PT Goals Patient Stated Goal: to return home, get back to playing music and singing PT Goal Formulation: With patient Time For Goal Achievement: 06/28/18 Potential to Achieve Goals: Good Progress towards PT goals: Progressing toward goals    Frequency    Min 2X/week      PT Plan Current plan remains appropriate    Co-evaluation              AM-PAC PT "6 Clicks" Daily Activity  Outcome Measure  Difficulty turning over  in bed (including adjusting bedclothes, sheets and blankets)?: None Difficulty moving from lying on back to sitting on the side of the bed? : Unable Difficulty sitting down on and standing up from a chair with arms (e.g., wheelchair, bedside commode, etc,.)?: Unable Help needed moving to and from a bed to chair (including a wheelchair)?: A Little Help needed walking in hospital room?: A Little Help needed climbing 3-5 steps with a railing? : A Little 6 Click Score: 15    End of Session Equipment Utilized During Treatment: Gait belt;Oxygen Activity Tolerance: Patient tolerated treatment well;Patient limited by fatigue Patient left: with bed alarm set;with family/visitor present Nurse Communication: Mobility status;Other (comment)(O2 needs) PT Visit Diagnosis: Other abnormalities of gait and mobility  (R26.89);Difficulty in walking, not elsewhere classified (R26.2);Muscle weakness (generalized) (M62.81);Unsteadiness on feet (R26.81)     Time: 3845-3646 PT Time Calculation (min) (ACUTE ONLY): 25 min  Charges:  $Gait Training: 23-37 mins                     Pallie Swigert, PT, GCS 06/28/18,11:57 AM

## 2018-06-28 NOTE — Progress Notes (Signed)
06/28/2018 3:10 PM  Francisco Gibbs to be D/C'd Home per MD order.  Discussed prescriptions and follow up appointments with the patient. Prescriptions given to patient, medication list explained in detail. Pt verbalized understanding.  Allergies as of 06/28/2018   No Known Allergies     Medication List    STOP taking these medications   hydrALAZINE 25 MG tablet Commonly known as:  APRESOLINE   losartan 50 MG tablet Commonly known as:  COZAAR     TAKE these medications   ammonium lactate 12 % cream Commonly known as:  AMLACTIN Apply 1 application topically daily as needed for dry skin.   aspirin 81 MG chewable tablet Chew 1 tablet by mouth daily.   atorvastatin 80 MG tablet Commonly known as:  LIPITOR Take 1 tablet by mouth daily.   cetirizine 10 MG tablet Commonly known as:  ZYRTEC Take 10 mg by mouth daily.   clorazepate 7.5 MG tablet Commonly known as:  TRANXENE Take 1 tablet by mouth daily.   COLACE 100 MG capsule Generic drug:  docusate sodium Take 1 capsule by mouth daily.   cyclobenzaprine 5 MG tablet Commonly known as:  FLEXERIL Take 1 tablet by mouth at bedtime as needed for muscle spasms.   fluticasone 50 MCG/ACT nasal spray Commonly known as:  FLONASE Place 2 sprays into the nose daily.   furosemide 40 MG tablet Commonly known as:  LASIX Take 0.5 tablets (20 mg total) by mouth daily. What changed:  when to take this   gabapentin 300 MG capsule Commonly known as:  NEURONTIN Take 1 capsule by mouth 3 (three) times daily.   ipratropium-albuterol 0.5-2.5 (3) MG/3ML Soln Commonly known as:  DUONEB Take 3 mLs by nebulization every 6 (six) hours as needed.   levofloxacin 500 MG tablet Commonly known as:  LEVAQUIN Take 1 tablet (500 mg total) by mouth daily for 7 days.   magnesium oxide 400 (241.3 Mg) MG tablet Commonly known as:  MAG-OX Take 1 tablet (400 mg total) by mouth daily. What changed:  when to take this   metFORMIN 500 MG 24 hr  tablet Commonly known as:  GLUCOPHAGE-XR Take 1,000 mg by mouth 2 (two) times daily.   metoprolol succinate 50 MG 24 hr tablet Commonly known as:  TOPROL-XL Take 50 mg by mouth daily.   omeprazole 40 MG capsule Commonly known as:  PRILOSEC Take 1 capsule by mouth daily.   quinapril 40 MG tablet Commonly known as:  ACCUPRIL Take 1 tablet by mouth daily.   SPIRIVA HANDIHALER 18 MCG inhalation capsule Generic drug:  tiotropium Place 1 capsule into inhaler and inhale daily.   terazosin 1 MG capsule Commonly known as:  HYTRIN Take 1 capsule by mouth daily.   VENTOLIN HFA 108 (90 Base) MCG/ACT inhaler Generic drug:  albuterol Inhale 2 puffs into the lungs every 4 (four) hours as needed for wheezing or shortness of breath.   Vitamin D (Ergocalciferol) 50000 units Caps capsule Commonly known as:  DRISDOL Take 50,000 Units by mouth once a week.            Durable Medical Equipment  (From admission, onward)        Start     Ordered   06/28/18 1028  DME Oxygen  Once    Question Answer Comment  Mode or (Route) Nasal cannula   Liters per Minute 2   Frequency Continuous (stationary and portable oxygen unit needed)   Oxygen conserving device Yes   Oxygen  delivery system Gas      06/28/18 1028   06/28/18 0000  DME Nebulizer machine    Question:  Patient needs a nebulizer to treat with the following condition  Answer:  COPD (chronic obstructive pulmonary disease) ([place])   06/28/18 1028      Vitals:   06/28/18 1148 06/28/18 1327  BP:    Pulse:    Resp:    Temp:    SpO2: 92% 99%    Skin clean, dry and intact without evidence of skin break down, no evidence of skin tears noted. IV catheter discontinued intact. Site without signs and symptoms of complications. Dressing and pressure applied. Pt denies pain at this time. No complaints noted.  An After Visit Summary was printed and given to the patient. Patient escorted via [place], and D/C home via private auto.  [name]

## 2018-06-28 NOTE — Progress Notes (Signed)
SATURATION QUALIFICATIONS: (This note is used to comply with regulatory documentation for home oxygen)  Patient Saturations on Room Air at Rest = 86%  Patient Saturations on Room Air while Ambulating = 84%  Patient Saturations on 2 Liters of oxygen while Ambulating = 92%  Please briefly explain why patient needs home oxygen:  Pt does not maintain 02 sat >88% on room air at rest or with ambulation. 2L02 raises 02 sat above 90% at rest and with ambulation.  [name], RN

## 2018-06-28 NOTE — Care Management Note (Signed)
Case Management Note  Patient Details  Name: Francisco Gibbs MRN: [id number] Date of Birth: Jan 07, 1933   Patient to discharge today.  After discharge patient will be staying at sister's home at [facility], [place].  His sister's Hassan Rowan number is [phone].  Referral was made to [facility] with [place], and he was made aware of this information.  Portable o2 and nebulizer was delivered prior to discharge.   Subjective/Objective:                    Action/Plan:   Expected Discharge Date:  06/28/18               Expected Discharge Plan:  [facility]  In-House Referral:     Discharge planning Services     Post Acute Care Choice:  Durable Medical Equipment, Home Health Choice offered to:  Patient, Sibling  DME Arranged:  Oxygen DME Agency:  [place]:  RN, PT, Nurse's Aide, Social Work [employer] Agency:  [place]  Status of Service:  Completed, signed off  If discussed at [employer], dates discussed:    Additional Comments:  Beverly Sessions, RN 06/28/2018, 4:03 PM

## 2018-06-29 LAB — CULTURE, BLOOD (ROUTINE X 2)
CULTURE: NO GROWTH
CULTURE: NO GROWTH
Special Requests: ADEQUATE
Special Requests: ADEQUATE

## 2018-10-19 ENCOUNTER — Encounter: Payer: Self-pay | Admitting: Emergency Medicine

## 2018-10-19 ENCOUNTER — Other Ambulatory Visit: Payer: Self-pay

## 2018-10-19 ENCOUNTER — Inpatient Hospital Stay
Admission: EM | Admit: 2018-10-19 | Discharge: 2018-10-21 | DRG: 641 | Disposition: A | Discharge status: Home / Self Care | Payer: Medicare Other | Attending: Specialist | Admitting: Specialist

## 2018-10-19 DIAGNOSIS — I129 Hypertensive chronic kidney disease with stage 1 through stage 4 chronic kidney disease, or unspecified chronic kidney disease: Secondary | ICD-10-CM | POA: Diagnosis present

## 2018-10-19 DIAGNOSIS — Z7951 Long term (current) use of inhaled steroids: Secondary | ICD-10-CM | POA: Diagnosis not present

## 2018-10-19 DIAGNOSIS — E875 Hyperkalemia: Principal | ICD-10-CM | POA: Diagnosis present

## 2018-10-19 DIAGNOSIS — K219 Gastro-esophageal reflux disease without esophagitis: Secondary | ICD-10-CM | POA: Diagnosis present

## 2018-10-19 DIAGNOSIS — Z7982 Long term (current) use of aspirin: Secondary | ICD-10-CM | POA: Diagnosis not present

## 2018-10-19 DIAGNOSIS — E1140 Type 2 diabetes mellitus with diabetic neuropathy, unspecified: Secondary | ICD-10-CM | POA: Diagnosis present

## 2018-10-19 DIAGNOSIS — R001 Bradycardia, unspecified: Secondary | ICD-10-CM | POA: Diagnosis present

## 2018-10-19 DIAGNOSIS — Z7984 Long term (current) use of oral hypoglycemic drugs: Secondary | ICD-10-CM | POA: Diagnosis not present

## 2018-10-19 DIAGNOSIS — E119 Type 2 diabetes mellitus without complications: Secondary | ICD-10-CM | POA: Diagnosis present

## 2018-10-19 DIAGNOSIS — Z79899 Other long term (current) drug therapy: Secondary | ICD-10-CM

## 2018-10-19 DIAGNOSIS — Z87891 Personal history of nicotine dependence: Secondary | ICD-10-CM

## 2018-10-19 DIAGNOSIS — E785 Hyperlipidemia, unspecified: Secondary | ICD-10-CM | POA: Diagnosis present

## 2018-10-19 DIAGNOSIS — J449 Chronic obstructive pulmonary disease, unspecified: Secondary | ICD-10-CM | POA: Diagnosis present

## 2018-10-19 DIAGNOSIS — N183 Chronic kidney disease, stage 3 (moderate): Secondary | ICD-10-CM | POA: Diagnosis present

## 2018-10-19 DIAGNOSIS — N179 Acute kidney failure, unspecified: Secondary | ICD-10-CM | POA: Diagnosis present

## 2018-10-19 LAB — CBC
HCT: 32.1 % — ABNORMAL LOW (ref 39.0–52.0)
HEMATOCRIT: 32.4 % — AB (ref 39.0–52.0)
HEMOGLOBIN: 10.2 g/dL — AB (ref 13.0–17.0)
Hemoglobin: 10.2 g/dL — ABNORMAL LOW (ref 13.0–17.0)
MCH: 29.4 pg (ref 26.0–34.0)
MCH: 29.3 pg (ref 26.0–34.0)
MCHC: 31.5 g/dL (ref 30.0–36.0)
MCHC: 31.8 g/dL (ref 30.0–36.0)
MCV: 93.4 fL (ref 80.0–100.0)
MCV: 92.2 fL (ref 80.0–100.0)
PLATELETS: 181 10*3/uL (ref 150–400)
Platelets: 201 10*3/uL (ref 150–400)
RBC: 3.47 MIL/uL — ABNORMAL LOW (ref 4.22–5.81)
RBC: 3.48 MIL/uL — ABNORMAL LOW (ref 4.22–5.81)
RDW: 13 % (ref 11.5–15.5)
RDW: 13.1 % (ref 11.5–15.5)
WBC: 4.8 10*3/uL (ref 4.0–10.5)
WBC: 5.4 10*3/uL (ref 4.0–10.5)
nRBC: 0 % (ref 0.0–0.2)
nRBC: 0 % (ref 0.0–0.2)

## 2018-10-19 LAB — POCT I-STAT, CHEM 8
BUN: 38 mg/dL — AB (ref 8–23)
CALCIUM ION: 1.38 mmol/L (ref 1.15–1.40)
CHLORIDE: 108 mmol/L (ref 98–111)
Creatinine, Ser: 1.5 mg/dL — ABNORMAL HIGH (ref 0.61–1.24)
GLUCOSE: 92 mg/dL (ref 70–99)
HCT: 33 % — ABNORMAL LOW (ref 39.0–52.0)
Hemoglobin: 11.2 g/dL — ABNORMAL LOW (ref 13.0–17.0)
Potassium: 7.5 mmol/L (ref 3.5–5.1)
Sodium: 132 mmol/L — ABNORMAL LOW (ref 135–145)
TCO2: 22 mmol/L (ref 22–32)

## 2018-10-19 LAB — BASIC METABOLIC PANEL
Anion gap: 3 — ABNORMAL LOW (ref 5–15)
Anion gap: 5 (ref 5–15)
BUN: 44 mg/dL — ABNORMAL HIGH (ref 8–23)
BUN: 39 mg/dL — AB (ref 8–23)
CALCIUM: 9.8 mg/dL (ref 8.9–10.3)
CHLORIDE: 108 mmol/L (ref 98–111)
CO2: 23 mmol/L (ref 22–32)
CO2: 23 mmol/L (ref 22–32)
CREATININE: 1.28 mg/dL — AB (ref 0.61–1.24)
Calcium: 10.2 mg/dL (ref 8.9–10.3)
Chloride: 107 mmol/L (ref 98–111)
Creatinine, Ser: 1.35 mg/dL — ABNORMAL HIGH (ref 0.61–1.24)
GFR calc Af Amer: 58 mL/min — ABNORMAL LOW (ref >60)
GFR calc non Af Amer: 50 mL/min — ABNORMAL LOW (ref >60)
GFR, EST AFRICAN AMERICAN: 54 mL/min — AB (ref >60)
GFR, EST NON AFRICAN AMERICAN: 47 mL/min — AB (ref >60)
GLUCOSE: 100 mg/dL — AB (ref 70–99)
Glucose, Bld: 111 mg/dL — ABNORMAL HIGH (ref 70–99)
Potassium: 7.4 mmol/L (ref 3.5–5.1)
Potassium: 6.2 mmol/L — ABNORMAL HIGH (ref 3.5–5.1)
Sodium: 133 mmol/L — ABNORMAL LOW (ref 135–145)
Sodium: 136 mmol/L (ref 135–145)

## 2018-10-19 LAB — MAGNESIUM: Magnesium: 1.2 mg/dL — ABNORMAL LOW (ref 1.7–2.4)

## 2018-10-19 LAB — GLUCOSE, CAPILLARY: GLUCOSE-CAPILLARY: 69 mg/dL — AB (ref 70–99)

## 2018-10-19 MED ORDER — INSULIN ASPART 100 UNIT/ML ~~LOC~~ SOLN
5.0000 [IU] | Freq: Once | SUBCUTANEOUS | Status: AC
  Administered 2018-10-19: 5 [IU] via INTRAVENOUS
  Filled 2018-10-19: qty 1

## 2018-10-19 MED ORDER — ATORVASTATIN CALCIUM 20 MG PO TABS
80.0000 mg | ORAL_TABLET | Freq: Every evening | ORAL | Status: DC
  Administered 2018-10-19 – 2018-10-20 (×2): 80 mg via ORAL
  Filled 2018-10-19 (×2): qty 4

## 2018-10-19 MED ORDER — ACETAMINOPHEN 650 MG RE SUPP: 650.0000 mg | Freq: Four times a day (QID) | RECTAL | Status: DC | PRN

## 2018-10-19 MED ORDER — TIOTROPIUM BROMIDE MONOHYDRATE 18 MCG IN CAPS
18.0000 ug | ORAL_CAPSULE | Freq: Every day | RESPIRATORY_TRACT | Status: DC
  Administered 2018-10-20 – 2018-10-21 (×2): 18 ug via RESPIRATORY_TRACT
  Filled 2018-10-19: qty 5

## 2018-10-19 MED ORDER — ENOXAPARIN SODIUM 40 MG/0.4ML ~~LOC~~ SOLN
40.0000 mg | SUBCUTANEOUS | Status: DC
  Administered 2018-10-20: 40 mg via SUBCUTANEOUS
  Filled 2018-10-19: qty 0.4

## 2018-10-19 MED ORDER — PATIROMER SORBITEX CALCIUM 8.4 G PO PACK: 8.4000 g | PACK | Freq: Every day | ORAL | Status: DC

## 2018-10-19 MED ORDER — INSULIN ASPART 100 UNIT/ML ~~LOC~~ SOLN: 0.0000 [IU] | Freq: Three times a day (TID) | SUBCUTANEOUS | Status: DC

## 2018-10-19 MED ORDER — SODIUM BICARBONATE 8.4 % IV SOLN
50.0000 meq | Freq: Once | INTRAVENOUS | Status: AC
  Administered 2018-10-19: 50 meq via INTRAVENOUS
  Filled 2018-10-19: qty 50

## 2018-10-19 MED ORDER — TERAZOSIN HCL 1 MG PO CAPS
1.0000 mg | ORAL_CAPSULE | Freq: Every day | ORAL | Status: DC
  Administered 2018-10-19 – 2018-10-20 (×2): 1 mg via ORAL
  Filled 2018-10-19 (×3): qty 1

## 2018-10-19 MED ORDER — SODIUM CHLORIDE 0.9 % IV SOLN
INTRAVENOUS | Status: DC
  Administered 2018-10-19 – 2018-10-20 (×2): via INTRAVENOUS

## 2018-10-19 MED ORDER — INSULIN ASPART 100 UNIT/ML ~~LOC~~ SOLN: 0.0000 [IU] | Freq: Every day | SUBCUTANEOUS | Status: DC

## 2018-10-19 MED ORDER — ONDANSETRON HCL 4 MG PO TABS: 4.0000 mg | ORAL_TABLET | Freq: Four times a day (QID) | ORAL | Status: DC | PRN

## 2018-10-19 MED ORDER — ONDANSETRON HCL 4 MG/2ML IJ SOLN: 4.0000 mg | Freq: Four times a day (QID) | INTRAMUSCULAR | Status: DC | PRN

## 2018-10-19 MED ORDER — FLUTICASONE PROPIONATE 50 MCG/ACT NA SUSP
2.0000 | Freq: Every evening | NASAL | Status: DC
  Administered 2018-10-19 – 2018-10-20 (×2): 2 via NASAL
  Filled 2018-10-19: qty 16

## 2018-10-19 MED ORDER — PANTOPRAZOLE SODIUM 40 MG PO TBEC
40.0000 mg | DELAYED_RELEASE_TABLET | Freq: Every day | ORAL | Status: DC
  Administered 2018-10-20 – 2018-10-21 (×2): 40 mg via ORAL
  Filled 2018-10-19 (×2): qty 1

## 2018-10-19 MED ORDER — PATIROMER SORBITEX CALCIUM 8.4 G PO PACK
25.2000 g | PACK | Freq: Every day | ORAL | Status: DC
  Administered 2018-10-20 – 2018-10-21 (×2): 25.2 g via ORAL
  Filled 2018-10-19 (×2): qty 3

## 2018-10-19 MED ORDER — FUROSEMIDE 20 MG PO TABS
20.0000 mg | ORAL_TABLET | Freq: Two times a day (BID) | ORAL | Status: DC
  Administered 2018-10-20 – 2018-10-21 (×3): 20 mg via ORAL
  Filled 2018-10-19 (×3): qty 1

## 2018-10-19 MED ORDER — SODIUM CHLORIDE 0.9 % IV BOLUS
500.0000 mL | Freq: Once | INTRAVENOUS | Status: AC
  Administered 2018-10-19: 500 mL via INTRAVENOUS

## 2018-10-19 MED ORDER — CALCIUM GLUCONATE 10 % IV SOLN
1.0000 g | Freq: Once | INTRAVENOUS | Status: AC
  Administered 2018-10-19: 1 g via INTRAVENOUS
  Filled 2018-10-19: qty 10

## 2018-10-19 MED ORDER — GABAPENTIN 300 MG PO CAPS
300.0000 mg | ORAL_CAPSULE | Freq: Three times a day (TID) | ORAL | Status: DC
  Administered 2018-10-19 – 2018-10-21 (×5): 300 mg via ORAL
  Filled 2018-10-19 (×5): qty 1

## 2018-10-19 MED ORDER — DEXTROSE 50 % IV SOLN
INTRAVENOUS | Status: AC
  Administered 2018-10-19: 25 mL via INTRAVENOUS
  Filled 2018-10-19: qty 50

## 2018-10-19 MED ORDER — AMLODIPINE BESYLATE 10 MG PO TABS
10.0000 mg | ORAL_TABLET | Freq: Every evening | ORAL | Status: DC
  Administered 2018-10-19 – 2018-10-20 (×2): 10 mg via ORAL
  Filled 2018-10-19 (×2): qty 1

## 2018-10-19 MED ORDER — ALBUTEROL SULFATE (2.5 MG/3ML) 0.083% IN NEBU
2.5000 mg | INHALATION_SOLUTION | Freq: Once | RESPIRATORY_TRACT | Status: AC
  Administered 2018-10-19: 2.5 mg via RESPIRATORY_TRACT
  Filled 2018-10-19: qty 3

## 2018-10-19 MED ORDER — ASPIRIN 81 MG PO CHEW
81.0000 mg | CHEWABLE_TABLET | Freq: Every day | ORAL | Status: DC
  Administered 2018-10-20 – 2018-10-21 (×2): 81 mg via ORAL
  Filled 2018-10-19 (×2): qty 1

## 2018-10-19 MED ORDER — ALBUTEROL SULFATE (2.5 MG/3ML) 0.083% IN NEBU: 2.5000 mg | INHALATION_SOLUTION | RESPIRATORY_TRACT | Status: DC | PRN

## 2018-10-19 MED ORDER — LORATADINE 10 MG PO TABS
10.0000 mg | ORAL_TABLET | Freq: Every day | ORAL | Status: DC
  Administered 2018-10-19 – 2018-10-20 (×2): 10 mg via ORAL
  Filled 2018-10-19 (×2): qty 1

## 2018-10-19 MED ORDER — IPRATROPIUM-ALBUTEROL 0.5-2.5 (3) MG/3ML IN SOLN: 3.0000 mL | Freq: Four times a day (QID) | RESPIRATORY_TRACT | Status: DC | PRN

## 2018-10-19 MED ORDER — ACETAMINOPHEN 325 MG PO TABS: 650.0000 mg | ORAL_TABLET | Freq: Four times a day (QID) | ORAL | Status: DC | PRN

## 2018-10-19 MED ORDER — CLORAZEPATE DIPOTASSIUM 7.5 MG PO TABS
7.5000 mg | ORAL_TABLET | Freq: Every day | ORAL | Status: DC
  Administered 2018-10-19 – 2018-10-20 (×2): 7.5 mg via ORAL
  Filled 2018-10-19 (×3): qty 1

## 2018-10-19 MED ORDER — VITAMIN D (ERGOCALCIFEROL) 1.25 MG (50000 UNIT) PO CAPS: 50000.0000 [IU] | ORAL_CAPSULE | ORAL | Status: DC

## 2018-10-19 MED ORDER — DEXTROSE 50 % IV SOLN
25.0000 mL | Freq: Once | INTRAVENOUS | Status: AC
  Administered 2018-10-19: 25 mL via INTRAVENOUS

## 2018-10-19 NOTE — ED Notes (Signed)
Assisted pt to the restroom. Pt ambulatory.

## 2018-10-19 NOTE — ED Notes (Signed)
Elevated K levels reported to Quentin Cornwall, MD

## 2018-10-19 NOTE — ED Triage Notes (Addendum)
Pt sent for high potassium from doctor office.  Pt denies feeling bad.  Pt denies any symptoms at this time and only here because doctor sent him. Ambulatory. .  No pain. Loletha Grayer cardia noted in triage, last office visit HR was in 55s. Peaked T waves noted

## 2018-10-19 NOTE — ED Provider Notes (Signed)
[facility] Emergency Department Provider Note    First MD Initiated Contact with Patient 10/19/18 1909     (approximate)  I have reviewed the triage vital signs and the nursing notes.   HISTORY  Chief Complaint Abnormal Lab    HPI Francisco Gibbs is a 84 y.o. male the history of hypertension as well as COPD presents the ER for evaluation of hyperkalemia.  Patient denies any history of renal failure.  No new medications.  States he has been eating a lot of bananas and potatoes recently.  States he does feel some weakness but denies any chest pain, numbness or tingling, palpitations, shortness of breath. Did recently start amiliride for hypoK.  Had follow-up blood work done today and was sent in for hyperkalemia.  Denies any history of CKD.   Past Medical History:  Diagnosis Date  . COPD (chronic obstructive pulmonary disease) ([place])   . Diabetes mellitus without complication ([place])    Pt takes Metformin.  Marland Kitchen GERD (gastroesophageal reflux disease)   . HLD (hyperlipidemia)   . Hypertension    Family History  Problem Relation Age of Onset  . Diabetes Sister   . Diabetes Paternal Aunt    Past Surgical History:  Procedure Laterality Date  . HYDROCELE EXCISION / REPAIR    . INGUINAL HERNIA REPAIR    . KNEE SURGERY     Patient Active Problem List   Diagnosis Date Noted  . Hyperkalemia 10/19/2018  . Sepsis ([place]) 06/25/2018  . CAP (community acquired pneumonia) 06/25/2018  . HTN (hypertension) 06/25/2018  . HLD (hyperlipidemia) 06/25/2018  . GERD (gastroesophageal reflux disease) 06/25/2018  . COPD (chronic obstructive pulmonary disease) ([place]) 06/25/2018  . Diabetes ([place]) 06/25/2018  . Hypomagnesemia 04/28/2018      Prior to Admission medications   Medication Sig Start Date End Date Taking? Authorizing Provider  albuterol (PROVENTIL HFA;VENTOLIN HFA) 108 (90 Base) MCG/ACT inhaler Inhale 2 puffs into the lungs every 4 (four) hours as needed for  wheezing or shortness of breath.   Yes [provider]  amLODipine (NORVASC) 10 MG tablet Take 10 mg by mouth every evening.    Yes [provider]  aspirin 81 MG chewable tablet Chew 81 mg by mouth daily.    Yes [provider]  atorvastatin (LIPITOR) 80 MG tablet Take 80 mg by mouth every evening.    Yes [provider]  Calcium Carbonate Antacid (CALCIUM CARBONATE PO) Take 1 tablet by mouth daily.   Yes [provider]  cetirizine (ZYRTEC) 10 MG tablet Take 10 mg by mouth every evening.    Yes [provider]  clorazepate (TRANXENE) 7.5 MG tablet Take 7.5 mg by mouth at bedtime.    Yes [provider]  fluticasone (FLONASE) 50 MCG/ACT nasal spray Place 2 sprays into the nose every evening.    Yes [provider]  furosemide (LASIX) 40 MG tablet Take 0.5 tablets (20 mg total) by mouth daily. Patient taking differently: Take 20 mg by mouth 2 (two) times daily.  04/29/18 04/29/19 Yes Epifanio Lesches, MD  gabapentin (NEURONTIN) 300 MG capsule Take 300 mg by mouth 3 (three) times daily.    Yes [provider]  hydrochlorothiazide (HYDRODIURIL) 25 MG tablet Take 25 mg by mouth every evening.    Yes [provider]  ipratropium-albuterol (DUONEB) 0.5-2.5 (3) MG/3ML SOLN Take 3 mLs by nebulization every 6 (six) hours as needed. Patient taking differently: Take 3 mLs by nebulization every 6 (six)  hours as needed (for shortness of breath/wheezing).  06/28/18  Yes [name], MD  magnesium oxide (MAG-OX) 400 (241.3 Mg) MG tablet Take 1 tablet (400 mg total) by mouth daily. Patient taking differently: Take 400 mg by mouth 2 (two) times daily.  04/29/18  Yes Epifanio Lesches, MD  metFORMIN (GLUCOPHAGE-XR) 500 MG 24 hr tablet Take 1,000 mg by mouth 2 (two) times daily. 11/29/17  Yes [provider]  omeprazole (PRILOSEC) 40 MG capsule Take 40 mg by mouth daily.    Yes [provider]    quinapril (ACCUPRIL) 40 MG tablet Take 40 mg by mouth daily.    Yes [provider]  terazosin (HYTRIN) 1 MG capsule Take 1 mg by mouth at bedtime.    Yes [provider]  tiotropium (SPIRIVA) 18 MCG inhalation capsule Place 18 mcg into inhaler and inhale daily.   Yes [provider]  Vitamin D, Ergocalciferol, (DRISDOL) 50000 units CAPS capsule Take 50,000 Units by mouth every Saturday.    Yes [provider]    Allergies Patient has no known allergies.    Social History Social History   Tobacco Use  . Smoking status: Former Smoker    Packs/day: 1.00    Years: 70.00    Pack years: 70.00    Types: Cigarettes  . Smokeless tobacco: Never Used  Substance Use Topics  . Alcohol use: Not Currently  . Drug use: Never    Review of Systems Patient denies headaches, rhinorrhea, blurry vision, numbness, shortness of breath, chest pain, edema, cough, abdominal pain, nausea, vomiting, diarrhea, dysuria, fevers, rashes or hallucinations unless otherwise stated above in HPI. ____________________________________________   PHYSICAL EXAM:  VITAL SIGNS: Vitals:   10/19/18 1840 10/19/18 1841  BP:  99/87  Pulse:  (!) 42  Resp:  18  Temp: 98.4 F (36.9 C)   SpO2:  97%    Constitutional: Alert and oriented.  Eyes: Conjunctivae are normal.  Head: Atraumatic. Nose: No congestion/rhinnorhea. Mouth/Throat: Mucous membranes are moist.   Neck: No stridor. Painless ROM.  Cardiovascular: bradycardic rate, regular rhythm. Grossly normal heart sounds.  Good peripheral circulation. Respiratory: Normal respiratory effort.  No retractions. Lungs CTAB. Gastrointestinal: Soft and nontender. No distention. No abdominal bruits. No CVA tenderness. Genitourinary:  Musculoskeletal: No lower extremity tenderness nor edema.  No joint effusions. Neurologic:  Normal speech and language. No gross focal neurologic deficits are appreciated. No facial droop Skin:  Skin is  warm, dry and intact. No rash noted. Psychiatric: Mood and affect are normal. Speech and behavior are normal.  ____________________________________________   LABS (all labs ordered are listed, but only abnormal results are displayed)  Results for orders placed or performed during the hospital encounter of 10/19/18 (from the past 24 hour(s))  CBC     Status: Abnormal   Collection Time: 10/19/18  6:37 PM  Result Value Ref Range   WBC 4.8 4.0 - 10.5 K/uL   RBC 3.47 (L) 4.22 - 5.81 MIL/uL   Hemoglobin 10.2 (L) 13.0 - 17.0 g/dL   HCT 32.4 (L) 39.0 - 52.0 %   MCV 93.4 80.0 - 100.0 fL   MCH 29.4 26.0 - 34.0 pg   MCHC 31.5 30.0 - 36.0 g/dL   RDW 13.0 11.5 - 15.5 %   Platelets 201 150 - 400 K/uL   nRBC 0.0 0.0 - 0.2 %  Basic metabolic panel     Status: Abnormal   Collection Time: 10/19/18  6:37 PM  Result Value Ref Range  Sodium 133 (L) 135 - 145 mmol/L   Potassium 7.4 (HH) 3.5 - 5.1 mmol/L   Chloride 107 98 - 111 mmol/L   CO2 23 22 - 32 mmol/L   Glucose, Bld 100 (H) 70 - 99 mg/dL   BUN 44 (H) 8 - 23 mg/dL   Creatinine, Ser 1.35 (H) 0.61 - 1.24 mg/dL   Calcium 9.8 8.9 - 10.3 mg/dL   GFR calc non Af Amer 47 (L) >60 mL/min   GFR calc Af Amer 54 (L) >60 mL/min   Anion gap 3 (L) 5 - 15  Magnesium     Status: Abnormal   Collection Time: 10/19/18  6:37 PM  Result Value Ref Range   Magnesium 1.2 (L) 1.7 - 2.4 mg/dL  I-STAT, chem 8     Status: Abnormal   Collection Time: 10/19/18  7:23 PM  Result Value Ref Range   Sodium 132 (L) 135 - 145 mmol/L   Potassium 7.5 (HH) 3.5 - 5.1 mmol/L   Chloride 108 98 - 111 mmol/L   BUN 38 (H) 8 - 23 mg/dL   Creatinine, Ser 1.50 (H) 0.61 - 1.24 mg/dL   Glucose, Bld 92 70 - 99 mg/dL   Calcium, Ion 1.38 1.15 - 1.40 mmol/L   TCO2 22 22 - 32 mmol/L   Hemoglobin 11.2 (L) 13.0 - 17.0 g/dL   HCT 33.0 (L) 39.0 - 52.0 %   Comment NOTIFIED PHYSICIAN    ____________________________________________  EKG My review and personal interpretation at Time:  18:45   Indication: hyperkalemia  Rate: 45  Rhythm: sinus Axis: normal Other: bradycardia, peaked t waves anteriorly ____________________________________________  RADIOLOGY   ____________________________________________   PROCEDURES  Procedure(s) performed:  .Critical Care Performed by: Merlyn Lot, MD Authorized by: Merlyn Lot, MD   Critical care provider statement:    Critical care time (minutes):  40   Critical care time was exclusive of:  Separately billable procedures and treating other patients   Critical care was necessary to treat or prevent imminent or life-threatening deterioration of the following conditions:  Renal failure and metabolic crisis   Critical care was time spent personally by me on the following activities:  Development of treatment plan with patient or surrogate, discussions with consultants, evaluation of patient's response to treatment, examination of patient, obtaining history from patient or surrogate, ordering and performing treatments and interventions, ordering and review of laboratory studies, ordering and review of radiographic studies, pulse oximetry, re-evaluation of patient's condition and review of old charts      Critical Care performed: yes ____________________________________________   INITIAL IMPRESSION / [place] / ED COURSE  Pertinent labs & imaging results that were available during my care of the patient were reviewed by me and considered in my medical decision making (see chart for details).   DDX: Electrolyte abnormality, dehydration, AKI, medication effect, cardiomyopathy  ZOLTON DOWSON is a 84 y.o. who presents to the ED with evidence of acute hyperkalemia.  Patient with EKG showing evidence of peaked T waves.  Does have sinus bradycardia.  Mild AKI.  Patient will be given IV calcium, albuterol, insulin, D50, sodium bicarb as well as [place].  I have consulted Dr. Juleen China of nephrology.  Currently agrees  with treatment plan.  Discussed case with hospitalist.  We will keep him on cardiac monitor.  He is showing response to temporizing medications.    Clinical Course as of Oct 19 2021  Tue Oct 19, 2018  2022 Patient reassessed.  Remains [place]  dynamically stable.  Patient will be admitted to hospitalist for further hemodynamic monitoring.  Have ordered repeat BMP in 2 hours.   [PR]    Clinical Course User Index [PR] Merlyn Lot, MD     As part of my medical decision making, I reviewed the following data within the [place] notes reviewed and incorporated, Labs reviewed, notes from prior ED visits . ____________________________________________   FINAL CLINICAL IMPRESSION(S) / ED DIAGNOSES  Final diagnoses:  Hyperkalemia  AKI (acute kidney injury) ([place])      NEW MEDICATIONS STARTED DURING THIS VISIT:  New Prescriptions   No medications on file     Note:  This document was prepared using Dragon voice recognition software and may include unintentional dictation errors.    Merlyn Lot, MD 10/19/18 2022

## 2018-10-19 NOTE — H&P (Signed)
[place] [facility] [place] NAME: Francisco Gibbs    MR#:  [id number]  DATE OF BIRTH:  March 23, 1933  DATE OF ADMISSION:  10/19/2018  PRIMARY CARE PHYSICIAN: [name], MD   REQUESTING/REFERRING PHYSICIAN: Dr. Merlyn Lot  CHIEF COMPLAINT:   Chief Complaint  Patient presents with  . Abnormal Lab    HISTORY OF PRESENT ILLNESS:  Francisco Gibbs  is a 84 y.o. male with a known history of COPD, diabetes, hypertension, hyperlipidemia, GERD who presents to the hospital due to abnormal blood work.  Patient apparently had some blood work done as nephrologist office is noted to have elevated potassium and therefore referred to come to the ER.  In the emergency room patient underwent repeat blood work which showed a potassium of 7.5.  Patient was noted to have EKG changes and was having some relative bradycardia and therefore hospitalist services were contacted for admission.  Patient has had no symptoms of chest pain, palpitations, nausea vomiting, diarrhea or any other associated symptoms.  Patient was recently started on some amiloride by his nephrologist due to some potassium wasting and hypomagnesemia.  PAST MEDICAL HISTORY:   Past Medical History:  Diagnosis Date  . COPD (chronic obstructive pulmonary disease) ([place])   . Diabetes mellitus without complication ([place])    Pt takes Metformin.  Marland Kitchen GERD (gastroesophageal reflux disease)   . HLD (hyperlipidemia)   . Hypertension     PAST SURGICAL HISTORY:   Past Surgical History:  Procedure Laterality Date  . HYDROCELE EXCISION / REPAIR    . INGUINAL HERNIA REPAIR    . KNEE SURGERY      SOCIAL HISTORY:   Social History   Tobacco Use  . Smoking status: Former Smoker    Packs/day: 1.00    Years: 70.00    Pack years: 70.00    Types: Cigarettes  . Smokeless tobacco: Never Used  Substance Use Topics  . Alcohol use: Not Currently    FAMILY HISTORY:   Family History  Problem Relation Age of  Onset  . Diabetes Sister   . Diabetes Paternal Aunt     DRUG ALLERGIES:  No Known Allergies  REVIEW OF SYSTEMS:   Review of Systems  Constitutional: Negative for fever and weight loss.  HENT: Negative for congestion, nosebleeds and tinnitus.   Eyes: Negative for blurred vision, double vision and redness.  Respiratory: Negative for cough, hemoptysis and shortness of breath.   Cardiovascular: Negative for chest pain, orthopnea, leg swelling and PND.  Gastrointestinal: Negative for abdominal pain, diarrhea, melena, nausea and vomiting.  Genitourinary: Negative for dysuria, hematuria and urgency.  Musculoskeletal: Negative for falls and joint pain.  Neurological: Negative for dizziness, tingling, sensory change, focal weakness, seizures, weakness and headaches.  Endo/Heme/Allergies: Negative for polydipsia. Does not bruise/bleed easily.  Psychiatric/Behavioral: Negative for depression and memory loss. The patient is not nervous/anxious.     MEDICATIONS AT HOME:   Prior to Admission medications   Medication Sig Start Date End Date Taking? Authorizing Provider  ammonium lactate (AMLACTIN) 12 % cream Apply 1 application topically daily as needed for dry skin.  02/19/11   [provider]  aspirin 81 MG chewable tablet Chew 1 tablet by mouth daily. 03/31/12   [provider]  atorvastatin (LIPITOR) 80 MG tablet Take 1 tablet by mouth daily. 03/30/18   [provider]  cetirizine (ZYRTEC) 10 MG tablet Take 10 mg by mouth daily. 04/27/18   [provider]  clorazepate (TRANXENE) 7.5 MG tablet Take 1 tablet by mouth daily. 04/27/18   [provider]  cyclobenzaprine (FLEXERIL) 5 MG tablet Take 1 tablet by mouth at bedtime as needed for muscle spasms.  10/08/17   [provider]  docusate sodium (COLACE) 100 MG capsule Take 1 capsule by mouth daily. 03/27/16   [provider]  fluticasone (FLONASE) 50 MCG/ACT nasal spray Place 2 sprays into  the nose daily.  04/29/17   [provider]  furosemide (LASIX) 40 MG tablet Take 0.5 tablets (20 mg total) by mouth daily. Patient taking differently: Take 20 mg by mouth 2 (two) times daily.  04/29/18 04/29/19  Epifanio Lesches, MD  gabapentin (NEURONTIN) 300 MG capsule Take 1 capsule by mouth 3 (three) times daily. 04/27/18   [provider]  ipratropium-albuterol (DUONEB) 0.5-2.5 (3) MG/3ML SOLN Take 3 mLs by nebulization every 6 (six) hours as needed. 06/28/18   [name], MD  magnesium oxide (MAG-OX) 400 (241.3 Mg) MG tablet Take 1 tablet (400 mg total) by mouth daily. Patient taking differently: Take 400 mg by mouth 2 (two) times daily.  04/29/18   Epifanio Lesches, MD  metFORMIN (GLUCOPHAGE-XR) 500 MG 24 hr tablet Take 1,000 mg by mouth 2 (two) times daily. 11/29/17   [provider]  metoprolol succinate (TOPROL-XL) 50 MG 24 hr tablet Take 50 mg by mouth daily. 03/29/18   [provider]  omeprazole (PRILOSEC) 40 MG capsule Take 1 capsule by mouth daily. 03/30/18   [provider]  quinapril (ACCUPRIL) 40 MG tablet Take 1 tablet by mouth daily. 03/29/18   [provider]  SPIRIVA HANDIHALER 18 MCG inhalation capsule Place 1 capsule into inhaler and inhale daily. 03/30/18   [provider]  terazosin (HYTRIN) 1 MG capsule Take 1 capsule by mouth daily. 03/01/18   [provider]  VENTOLIN HFA 108 (90 Base) MCG/ACT inhaler Inhale 2 puffs into the lungs every 4 (four) hours as needed for wheezing or shortness of breath.  03/30/18   [provider]  Vitamin D, Ergocalciferol, (DRISDOL) 50000 units CAPS capsule Take 50,000 Units by mouth once a week. 05/28/18   [provider]      VITAL SIGNS:  Blood pressure 99/87, pulse (!) 42, temperature 98.4 F (36.9 C), temperature source Oral, resp. rate 18, height 5\' 6"  (1.676 m), weight 81.6 kg, SpO2 97 %.  PHYSICAL EXAMINATION:  Physical Exam  GENERAL:   84 y.o.-year-old patient lying in the bed in no acute distress.  EYES: Pupils equal, round, reactive to light and accommodation. No scleral icterus. Extraocular muscles intact.  HEENT: Head atraumatic, normocephalic. Oropharynx and nasopharynx clear. No oropharyngeal erythema, moist oral mucosa  NECK:  Supple, no jugular venous distention. No thyroid enlargement, no tenderness.  LUNGS: Normal breath sounds bilaterally, no wheezing, rales, rhonchi. No use of accessory muscles of respiration.  CARDIOVASCULAR: S1, S2 RRR. No murmurs, rubs, gallops, clicks.  ABDOMEN: Soft, nontender, nondistended. Bowel sounds present. No organomegaly or mass.  EXTREMITIES: No pedal edema, cyanosis, or clubbing. + 2 pedal & radial pulses b/l.   NEUROLOGIC: Cranial nerves II through XII are intact. No focal Motor or sensory deficits appreciated b/l.  PSYCHIATRIC: The patient is alert and oriented x 3.   SKIN: No obvious rash, lesion, or ulcer.   LABORATORY PANEL:   CBC Recent Labs  Lab 10/19/18 1837 10/19/18 1923  WBC 4.8  --   HGB 10.2* 11.2*  HCT 32.4* 33.0*  PLT 201  --    ------------------------------------------------------------------------------------------------------------------  Chemistries  Recent Labs  Lab 10/19/18 1837 10/19/18 1923  NA 133* 132*  K 7.4* 7.5*  CL 107 108  CO2 23  --   GLUCOSE 100* 92  BUN 44* 38*  CREATININE 1.35* 1.50*  CALCIUM 9.8  --   MG 1.2*  --    ------------------------------------------------------------------------------------------------------------------  Cardiac Enzymes No results for input(s): TROPONINI in the last 168 hours. ------------------------------------------------------------------------------------------------------------------  RADIOLOGY:  No results found.   IMPRESSION AND PLAN:   84 year old male with past medical history of COPD, diabetes, hypertension, hyperlipidemia who presents to the hospital due to abnormal blood work and  noted to be severely hyperkalemic.  1.  Acute hyperkalemia- likely in the setting of patient being on amiloride and also on ACE inhibitors. - Patient did have some relative bradycardia and peaked T waves on EKG.  Patient will be placed on telemetry. - Patient has been given insulin, D50, calcium gluconate in ER.  Patient is also received some IV fluids, will start the patient on some Veltassa. -We will consult nephrology.  Follow potassium level.  2.  COPD-no acute exacerbation. - Continue duo nebs as needed, continue Spiriva, albuterol inhaler as needed.  3.  Diabetes type 2 without complication-hold metformin, placed on sliding scale insulin for now.  4.  Hyperlipidemia-continue atorvastatin.  5.  GERD-continue Protonix.  6.  Essential hypertension-hold quinapril given the hyperkalemia.   - cont. Norvasc, Lasix.    All the records are reviewed and case discussed with ED provider. Management plans discussed with the patient, family and they are in agreement.  CODE STATUS: Full code  TOTAL TIME TAKING CARE OF THIS PATIENT: 45 minutes.    [name] M.D on 10/19/2018 at 8:12 PM  Between 7am to 6pm - Pager - [phone]  After 6pm go to [email or web address] - password [email or web address] [facility]  [place] [facility]  Office  [phone]  CC: Primary care physician; [name], MD

## 2018-10-20 ENCOUNTER — Other Ambulatory Visit: Payer: Self-pay

## 2018-10-20 LAB — POTASSIUM
Potassium: 6.9 mmol/L (ref 3.5–5.1)
Potassium: 6.3 mmol/L (ref 3.5–5.1)

## 2018-10-20 LAB — BASIC METABOLIC PANEL
Anion gap: 4 — ABNORMAL LOW (ref 5–15)
BUN: 36 mg/dL — ABNORMAL HIGH (ref 8–23)
CO2: 23 mmol/L (ref 22–32)
Calcium: 9.7 mg/dL (ref 8.9–10.3)
Chloride: 110 mmol/L (ref 98–111)
Creatinine, Ser: 1.21 mg/dL (ref 0.61–1.24)
GFR calc Af Amer: >60 mL/min (ref >60)
GFR calc non Af Amer: 54 mL/min — ABNORMAL LOW (ref >60)
Glucose, Bld: 186 mg/dL — ABNORMAL HIGH (ref 70–99)
Potassium: 6.1 mmol/L — ABNORMAL HIGH (ref 3.5–5.1)
Sodium: 137 mmol/L (ref 135–145)

## 2018-10-20 LAB — CBC
HCT: 28 % — ABNORMAL LOW (ref 39.0–52.0)
Hemoglobin: 8.8 g/dL — ABNORMAL LOW (ref 13.0–17.0)
MCH: 29 pg (ref 26.0–34.0)
MCHC: 31.4 g/dL (ref 30.0–36.0)
MCV: 92.4 fL (ref 80.0–100.0)
Platelets: 155 10*3/uL (ref 150–400)
RBC: 3.03 MIL/uL — ABNORMAL LOW (ref 4.22–5.81)
RDW: 13 % (ref 11.5–15.5)
WBC: 4.4 10*3/uL (ref 4.0–10.5)
nRBC: 0 % (ref 0.0–0.2)

## 2018-10-20 LAB — GLUCOSE, CAPILLARY
GLUCOSE-CAPILLARY: 90 mg/dL (ref 70–99)
GLUCOSE-CAPILLARY: 126 mg/dL — AB (ref 70–99)
Glucose-Capillary: 88 mg/dL (ref 70–99)
Glucose-Capillary: 96 mg/dL (ref 70–99)

## 2018-10-20 MED ORDER — SODIUM BICARBONATE 8.4 % IV SOLN
INTRAVENOUS | Status: DC
  Administered 2018-10-20 – 2018-10-21 (×2): via INTRAVENOUS
  Filled 2018-10-20 (×5): qty 100

## 2018-10-20 NOTE — Progress Notes (Signed)
Advanced care plan.  Purpose of the Encounter: CODE STATUS  Parties in Attendance: Patient  Patient's Decision Capacity: Good  Subjective/Patient's story: Presented to the emergency room for abnormal lab   Objective/Medical story Has elevated potassium Needs medications and intervention to reduce the level   Goals of care determination:  Advance care directives and goals of care and treatment plan discussed Patient wants everything done which includes CPR, intubation and ventilator the need arises   CODE STATUS: Full code   Time spent discussing advanced care planning: 16 minutes

## 2018-10-20 NOTE — Progress Notes (Addendum)
[place] at [place] NAME: Francisco Gibbs    MR#:  [id number]  DATE OF BIRTH:  [birth year]  SUBJECTIVE:  CHIEF COMPLAINT:   Chief Complaint  Patient presents with  . Abnormal Lab  Patient seen and evaluated today No complaints of any chest pain No palpitations  REVIEW OF SYSTEMS:    ROS  CONSTITUTIONAL: No documented fever. No fatigue, weakness. No weight gain, no weight loss.  EYES: No blurry or double vision.  ENT: No tinnitus. No postnasal drip. No redness of the oropharynx.  RESPIRATORY: No cough, no wheeze, no hemoptysis. No dyspnea.  CARDIOVASCULAR: No chest pain. No orthopnea. No palpitations. No syncope.  GASTROINTESTINAL: No nausea, no vomiting or diarrhea. No abdominal pain. No melena or hematochezia.  GENITOURINARY: No dysuria or hematuria.  ENDOCRINE: No polyuria or nocturia. No heat or cold intolerance.  HEMATOLOGY: No anemia. No bruising. No bleeding.  INTEGUMENTARY: No rashes. No lesions.  MUSCULOSKELETAL: No arthritis. No swelling. No gout.  NEUROLOGIC: No numbness, tingling, or ataxia. No seizure-type activity.  PSYCHIATRIC: No anxiety. No insomnia. No ADD.   DRUG ALLERGIES:   Allergies  Allergen Reactions  . Guaifenesin Shortness Of Breath  . Penicillin G Rash    Has patient had a PCN reaction causing immediate rash, facial/tongue/throat swelling, SOB or lightheadedness with hypotension: Yes Has patient had a PCN reaction causing severe rash involving mucus membranes or skin necrosis: No Has patient had a PCN reaction that required hospitalization: No Has patient had a PCN reaction occurring within the last 10 years: No If all of the above answers are "NO", then may proceed with Cephalosporin use.    VITALS:  Blood pressure (!) 146/68, pulse 74, temperature 97.7 F (36.5 C), temperature source Oral, resp. rate 18, height 5\' 6"  (1.676 m), weight 81.1 kg, SpO2 98 %.  PHYSICAL EXAMINATION:   Physical  Exam  GENERAL:  84 y.o.-year-old patient lying in the bed with no acute distress.  EYES: Pupils equal, round, reactive to light and accommodation. No scleral icterus. Extraocular muscles intact.  HEENT: Head atraumatic, normocephalic. Oropharynx and nasopharynx clear.  NECK:  Supple, no jugular venous distention. No thyroid enlargement, no tenderness.  LUNGS: Normal breath sounds bilaterally, no wheezing, rales, rhonchi. No use of accessory muscles of respiration.  CARDIOVASCULAR: S1, S2 normal. No murmurs, rubs, or gallops.  ABDOMEN: Soft, nontender, nondistended. Bowel sounds present. No organomegaly or mass.  EXTREMITIES: No cyanosis, clubbing or edema b/l.    NEUROLOGIC: Cranial nerves II through XII are intact. No focal Motor or sensory deficits b/l.   PSYCHIATRIC: The patient is alert and oriented x 3.  SKIN: No obvious rash, lesion, or ulcer.   LABORATORY PANEL:   CBC Recent Labs  Lab 10/20/18 0320  WBC 4.4  HGB 8.8*  HCT 28.0*  PLT 155   ------------------------------------------------------------------------------------------------------------------ Chemistries  Recent Labs  Lab 10/19/18 1837  10/20/18 0320 10/20/18 1449  NA 133*   < > 137  --   K 7.4*   < > 6.1* 6.9*  CL 107   < > 110  --   CO2 23   < > 23  --   GLUCOSE 100*   < > 186*  --   BUN 44*   < > 36*  --   CREATININE 1.35*   < > 1.21  --   CALCIUM 9.8   < > 9.7  --   MG 1.2*  --   --   --    < > =  values in this interval not displayed.   ------------------------------------------------------------------------------------------------------------------  Cardiac Enzymes No results for input(s): TROPONINI in the last 168 hours. ------------------------------------------------------------------------------------------------------------------  RADIOLOGY:  No results found.   ASSESSMENT AND PLAN:   84 year old male patient with history of COPD, diabetes mellitus type 2, hypertension, hyperlipidemia,  GERD admitted under hospitalist service for abnormal blood work  -Acute hyperkalemia Patient received oral Veltassa Potassium is still high IV sodium bicarbonate infusion We will follow-up with nephrology for recommendations Patient had received insulin D50 calcium gluconate in the emergency room  -COPD stable Continue home dose inhalers  -Hyperlipidemia continue statin medication  -Type 2 diabetes mellitus Diabetic diet with sliding scale coverage with insulin  -Tobacco abuse Tobacco cessation counseled to the patient for 6 minutes Nicotine patch offered  All the records are reviewed and case discussed with Care Management/Social Worker. Management plans discussed with the patient, family and they are in agreement.  CODE STATUS: Full code  DVT Prophylaxis: SCDs  TOTAL TIME TAKING CARE OF THIS PATIENT: 34 minutes.   POSSIBLE D/C IN 1 to 2 DAYS, DEPENDING ON CLINICAL CONDITION.  Saundra Shelling M.D on 10/20/2018 at 3:30 PM  Between 7am to 6pm - Pager - [phone]  After 6pm go to [email or web address] - password [email or web address] [place] [facility]  Office  [phone]  CC: Primary care physician; [name], MD  Note: This dictation was prepared with Dragon dictation along with smaller phrase technology. Any transcriptional errors that result from this process are unintentional.

## 2018-10-20 NOTE — Progress Notes (Signed)
CRITICAL VALUE ALERT  Critical Value:  Potassium 6.9  Date & Time Notied:  10/20/2018 1516  Provider Notified: Pyreddy Via Amion Orders Received/Actions taken: Awaiting response

## 2018-10-20 NOTE — Progress Notes (Signed)
[facility]  ROUNDING NOTE   Subjective:   Mr. CLAYTEN ALLCOCK admitted to [facility] on 10/19/2018 for Hyperkalemia [E87.5] AKI (acute kidney injury) [facility]) [N17.9]   Patient was started on amiloride on 10/31. Presented with followup  Labs on 11/8 with potassium above 7.5. Asked to come to the ED.   Potassium shifted to 6.1 today.   Objective:  Vital signs in last 24 hours:  Temp:  [97.7 F (36.5 C)-98.4 F (36.9 C)] 97.7 F (36.5 C) (11/20 0803) Pulse Rate:  [42-97] 74 (11/20 0803) Resp:  [14-18] 18 (11/20 0803) BP: (99-146)/(47-87) 146/68 (11/20 0803) SpO2:  [97 %-100 %] 98 % (11/20 0803) Weight:  [81.1 kg-81.6 kg] 81.1 kg (11/19 2133)  Weight change:  Filed Weights   10/19/18 1836 10/19/18 2133  Weight: 81.6 kg 81.1 kg    Intake/Output: I/O last 3 completed shifts: In: 500 [IV Piggyback:500] Out: 1500 [Urine:1500]   Intake/Output this shift:  Total I/O In: -  Out: 500 [Urine:500]  Physical Exam: General: NAD,   Head: Normocephalic, atraumatic. Moist oral mucosal membranes  Eyes: Anicteric, PERRL  Neck: Supple, trachea midline  Lungs:  Clear to auscultation  Heart: Regular rate and rhythm  Abdomen:  Soft, nontender,   Extremities: no peripheral edema.  Neurologic: Nonfocal, moving all four extremities  Skin: No lesions        Basic Metabolic Panel: Recent Labs  Lab 10/19/18 1837 10/19/18 1923 10/19/18 2227 10/20/18 0320  NA 133* 132* 136 137  K 7.4* 7.5* 6.2* 6.1*  CL 107 108 108 110  CO2 23  --  23 23  GLUCOSE 100* 92 111* 186*  BUN 44* 38* 39* 36*  CREATININE 1.35* 1.50* 1.28* 1.21  CALCIUM 9.8  --  10.2 9.7  MG 1.2*  --   --   --     Liver Function Tests: No results for input(s): AST, ALT, ALKPHOS, BILITOT, PROT, ALBUMIN in the last 168 hours. No results for input(s): LIPASE, AMYLASE in the last 168 hours. No results for input(s): AMMONIA in the last 168 hours.  CBC: Recent Labs  Lab 10/19/18 1837 10/19/18 1923 10/19/18 2227  10/20/18 0320  WBC 4.8  --  5.4 4.4  HGB 10.2* 11.2* 10.2* 8.8*  HCT 32.4* 33.0* 32.1* 28.0*  MCV 93.4  --  92.2 92.4  PLT 201  --  181 155    Cardiac Enzymes: No results for input(s): CKTOTAL, CKMB, CKMBINDEX, TROPONINI in the last 168 hours.  BNP: Invalid input(s): POCBNP  CBG: Recent Labs  Lab 10/19/18 2134 10/20/18 0801  GLUCAP 90* 53    Microbiology: Results for orders placed or performed during the hospital encounter of 06/24/18  Culture, blood (Routine x 2)     Status: None   Collection Time: 06/24/18  9:29 PM  Result Value Ref Range Status   Specimen Description BLOOD LEFT ANTECUBITAL  Final   Special Requests   Final    BOTTLES DRAWN AEROBIC AND ANAEROBIC Blood Culture adequate volume   Culture   Final    NO GROWTH 5 DAYS Performed at [facility], [address] [place], [place] [address]    Report Status 06/29/2018 FINAL  Final  Culture, blood (Routine x 2)     Status: None   Collection Time: 06/24/18  9:31 PM  Result Value Ref Range Status   Specimen Description BLOOD BLOOD RIGHT FOREARM  Final   Special Requests   Final    BOTTLES DRAWN AEROBIC AND ANAEROBIC  Blood Culture adequate volume   Culture   Final    NO GROWTH 5 DAYS Performed at [facility], [address] [place],  [address]    Report Status 06/29/2018 FINAL  Final    Coagulation Studies: No results for input(s): LABPROT, INR in the last 72 hours.  Urinalysis: No results for input(s): COLORURINE, LABSPEC, PHURINE, GLUCOSEU, HGBUR, BILIRUBINUR, KETONESUR, PROTEINUR, UROBILINOGEN, NITRITE, LEUKOCYTESUR in the last 72 hours.  Invalid input(s): APPERANCEUR    Imaging: No results found.   Medications:   . sodium chloride 100 mL/hr at 10/20/18 0921   . amLODipine  10 mg Oral QPM  . aspirin  81 mg Oral Daily  . atorvastatin  80 mg Oral QPM  . clorazepate  7.5 mg Oral QHS  . enoxaparin (LOVENOX) injection  40 mg Subcutaneous Q24H  . fluticasone  2  spray Each Nare QPM  . furosemide  20 mg Oral BID  . gabapentin  300 mg Oral TID  . insulin aspart  0-5 Units Subcutaneous QHS  . insulin aspart  0-9 Units Subcutaneous TID WC  . loratadine  10 mg Oral Daily  . pantoprazole  40 mg Oral Daily  . patiromer  25.2 g Oral Daily  . terazosin  1 mg Oral QHS  . tiotropium  18 mcg Inhalation Daily  . [START ON 10/23/2018] Vitamin D (Ergocalciferol)  50,000 Units Oral Q Sat   acetaminophen **OR** acetaminophen, albuterol, ipratropium-albuterol, ondansetron **OR** ondansetron (ZOFRAN) IV  Assessment/ Plan:  Mr. JOHNCHARLES FUSSELMAN is a 84 y.o. white male with hypertension, hypomagnesemia, hyperlipidemia, GERD, diabetes mellitus type II, COPD, diabetic neuropathy. Admitted to [facility] on 10/19/2018 for Hyperkalemia [E87.5] AKI (acute kidney injury) ([place]) [N17.9]  1. Hyperkalemia 2. Acute renal failure 3. Chronic kidney disease stage III baseline creatinine of 1.81, GFR of 34 4. Hypertension  Plan Discontinued amiloride - offending agent Continue IV fluids: NS at 163mL/hr Continue veltassa   LOS: 1 Carmel Waddington 11/20/201910:12 AM

## 2018-10-21 LAB — BASIC METABOLIC PANEL
Anion gap: 4 — ABNORMAL LOW (ref 5–15)
BUN: 24 mg/dL — AB (ref 8–23)
CHLORIDE: 105 mmol/L (ref 98–111)
CO2: 27 mmol/L (ref 22–32)
Calcium: 9.6 mg/dL (ref 8.9–10.3)
Creatinine, Ser: 1.2 mg/dL (ref 0.61–1.24)
GFR, EST NON AFRICAN AMERICAN: 54 mL/min — AB (ref >60)
Glucose, Bld: 160 mg/dL — ABNORMAL HIGH (ref 70–99)
POTASSIUM: 5 mmol/L (ref 3.5–5.1)
SODIUM: 136 mmol/L (ref 135–145)

## 2018-10-21 LAB — GLUCOSE, CAPILLARY: GLUCOSE-CAPILLARY: 115 mg/dL — AB (ref 70–99)

## 2018-10-21 NOTE — Progress Notes (Signed)
Discharge instructions explained to pt/ verbalized an understanding/ iv and tele removed/ will transport off unit via wheelchair.  

## 2018-10-21 NOTE — Discharge Summary (Signed)
[facility] [place] NAME: Francisco Gibbs    MR#:  [id number]  DATE OF BIRTH:  31-Oct-1933  DATE OF ADMISSION:  10/19/2018 ADMITTING PHYSICIAN: [name], MD  DATE OF DISCHARGE: 10/21/2018 11:00 AM  PRIMARY CARE PHYSICIAN: [name], MD    ADMISSION DIAGNOSIS:  Hyperkalemia [E87.5] AKI (acute kidney injury) ([place]) [N17.9]  DISCHARGE DIAGNOSIS:  Active Problems:   Hyperkalemia   SECONDARY DIAGNOSIS:   Past Medical History:  Diagnosis Date  . COPD (chronic obstructive pulmonary disease) ([place])   . Diabetes mellitus without complication ([place])    Pt takes Metformin.  Marland Kitchen GERD (gastroesophageal reflux disease)   . HLD (hyperlipidemia)   . Hypertension     HOSPITAL COURSE:   84 year old male with past medical history of COPD, diabetes, hypertension, hyperlipidemia who presents to the hospital due to abnormal blood work and noted to be severely hyperkalemic.  1.  Acute hyperkalemia- likely in the setting of patient being on amiloride and also on ACE inhibitors. - Patient was treated with IV fluids, insulin, D50, calcium gluconate.  Patient also received Veltassa.  His amiloride was discontinued. - His potassium level since then has improved and normalized.  He did have some EKG changes and some relative bradycardia on admission which is also improved now. - Nephrology was consulted who agreed with this management.  Patient at present will be discharged home as his potassium is better without his amiloride and outpatient nephrology follow-up in 2 weeks.  He will have a metabolic profile drawn at his nephrologist office next week.  2.  COPD-no acute exacerbation. -Patient will continue Spiriva, duo nebs as needed with albuterol inhaler as needed.  3.  Diabetes type 2 without complication- while in the hospital pt. was on sliding scale insulin but will resume his metformin upon discharge.  4.  Hyperlipidemia- pt. Will continue  atorvastatin.  5.  GERD- pt. Will continue Protonix.  6.  Essential hypertension- pt. Will resume Norvasc, HCTZ, Quinapril.  - BP stable.   DISCHARGE CONDITIONS:   Stable  CONSULTS OBTAINED:  Treatment Team:  Lavonia Dana, MD  DRUG ALLERGIES:   Allergies  Allergen Reactions  . Guaifenesin Shortness Of Breath  . Penicillin G Rash    Has patient had a PCN reaction causing immediate rash, facial/tongue/throat swelling, SOB or lightheadedness with hypotension: Yes Has patient had a PCN reaction causing severe rash involving mucus membranes or skin necrosis: No Has patient had a PCN reaction that required hospitalization: No Has patient had a PCN reaction occurring within the last 10 years: No If all of the above answers are "NO", then may proceed with Cephalosporin use.    DISCHARGE MEDICATIONS:   Allergies as of 10/21/2018      Reactions   Guaifenesin Shortness Of Breath   Penicillin G Rash   Has patient had a PCN reaction causing immediate rash, facial/tongue/throat swelling, SOB or lightheadedness with hypotension: Yes Has patient had a PCN reaction causing severe rash involving mucus membranes or skin necrosis: No Has patient had a PCN reaction that required hospitalization: No Has patient had a PCN reaction occurring within the last 10 years: No If all of the above answers are "NO", then may proceed with Cephalosporin use.      Medication List    TAKE these medications   albuterol 108 (90 Base) MCG/ACT inhaler Commonly known as:  PROVENTIL HFA;VENTOLIN HFA Inhale 2 puffs into the lungs every 4 (four) hours as needed  for wheezing or shortness of breath.   amLODipine 10 MG tablet Commonly known as:  NORVASC Take 10 mg by mouth every evening.   aspirin 81 MG chewable tablet Chew 81 mg by mouth daily.   atorvastatin 80 MG tablet Commonly known as:  LIPITOR Take 80 mg by mouth every evening.   CALCIUM CARBONATE PO Take 1 tablet by mouth daily.    cetirizine 10 MG tablet Commonly known as:  ZYRTEC Take 10 mg by mouth every evening.   clorazepate 7.5 MG tablet Commonly known as:  TRANXENE Take 7.5 mg by mouth at bedtime.   fluticasone 50 MCG/ACT nasal spray Commonly known as:  FLONASE Place 2 sprays into the nose every evening.   furosemide 40 MG tablet Commonly known as:  LASIX Take 0.5 tablets (20 mg total) by mouth daily. What changed:  when to take this   gabapentin 300 MG capsule Commonly known as:  NEURONTIN Take 300 mg by mouth 3 (three) times daily.   hydrochlorothiazide 25 MG tablet Commonly known as:  HYDRODIURIL Take 25 mg by mouth every evening.   ipratropium-albuterol 0.5-2.5 (3) MG/3ML Soln Commonly known as:  DUONEB Take 3 mLs by nebulization every 6 (six) hours as needed. What changed:  reasons to take this   magnesium oxide 400 (241.3 Mg) MG tablet Commonly known as:  MAG-OX Take 1 tablet (400 mg total) by mouth daily. What changed:  when to take this   metFORMIN 500 MG 24 hr tablet Commonly known as:  GLUCOPHAGE-XR Take 1,000 mg by mouth 2 (two) times daily.   omeprazole 40 MG capsule Commonly known as:  PRILOSEC Take 40 mg by mouth daily.   quinapril 40 MG tablet Commonly known as:  ACCUPRIL Take 40 mg by mouth daily.   terazosin 1 MG capsule Commonly known as:  HYTRIN Take 1 mg by mouth at bedtime.   tiotropium 18 MCG inhalation capsule Commonly known as:  SPIRIVA Place 18 mcg into inhaler and inhale daily.   Vitamin D (Ergocalciferol) 1.25 MG (50000 UT) Caps capsule Commonly known as:  DRISDOL Take 50,000 Units by mouth every Saturday.         DISCHARGE INSTRUCTIONS:   DIET:  Cardiac diet and Diabetic diet  DISCHARGE CONDITION:  Stable  ACTIVITY:  Activity as tolerated  OXYGEN:  Home Oxygen: No.   Oxygen Delivery: room air  DISCHARGE LOCATION:  home   If you experience worsening of your admission symptoms, develop shortness of breath, life threatening  emergency, suicidal or homicidal thoughts you must seek medical attention immediately by calling 911 or calling your MD immediately  if symptoms less severe.  You Must read complete instructions/literature along with all the possible adverse reactions/side effects for all the Medicines you take and that have been prescribed to you. Take any new Medicines after you have completely understood and accpet all the possible adverse reactions/side effects.   Please note  You were cared for by a hospitalist during your hospital stay. If you have any questions about your discharge medications or the care you received while you were in the hospital after you are discharged, you can call the unit and asked to speak with the hospitalist on call if the hospitalist that took care of you is not available. Once you are discharged, your primary care physician will handle any further medical issues. Please note that NO REFILLS for any discharge medications will be authorized once you are discharged, as it is imperative that you return to  your primary care physician (or establish a relationship with a primary care physician if you do not have one) for your aftercare needs so that they can reassess your need for medications and monitor your lab values.     Today   Calcium level is 5.0 today.  Patient is clinically asymptomatic.  Discussed with nephrology and will discharge home today with outpatient follow-up.  No acute arrhythmias or bradycardia overnight.  VITAL SIGNS:  Blood pressure 134/63, pulse 70, temperature (!) 97.2 F (36.2 C), temperature source Oral, resp. rate 19, height 5\' 6"  (1.676 m), weight 81.1 kg, SpO2 93 %.  I/O:    Intake/Output Summary (Last 24 hours) at 10/21/2018 1543 Last data filed at 10/21/2018 0937 Gross per 24 hour  Intake 690 ml  Output 1550 ml  Net -860 ml    PHYSICAL EXAMINATION:  GENERAL:  84 y.o.-year-old patient lying in the bed with no acute distress.  EYES: Pupils  equal, round, reactive to light and accommodation. No scleral icterus. Extraocular muscles intact.  HEENT: Head atraumatic, normocephalic. Oropharynx and nasopharynx clear.  NECK:  Supple, no jugular venous distention. No thyroid enlargement, no tenderness.  LUNGS: Normal breath sounds bilaterally, no wheezing, rales,rhonchi. No use of accessory muscles of respiration.  CARDIOVASCULAR: S1, S2 normal. No murmurs, rubs, or gallops.  ABDOMEN: Soft, non-tender, non-distended. Bowel sounds present. No organomegaly or mass.  EXTREMITIES: No pedal edema, cyanosis, or clubbing.  NEUROLOGIC: Cranial nerves II through XII are intact. No focal motor or sensory defecits b/l.  PSYCHIATRIC: The patient is alert and oriented x 3.  SKIN: No obvious rash, lesion, or ulcer.   DATA REVIEW:   CBC Recent Labs  Lab 10/20/18 0320  WBC 4.4  HGB 8.8*  HCT 28.0*  PLT 155    Chemistries  Recent Labs  Lab 10/19/18 1837  10/21/18 0344  NA 133*   < > 136  K 7.4*   < > 5.0  CL 107   < > 105  CO2 23   < > 27  GLUCOSE 100*   < > 160*  BUN 44*   < > 24*  CREATININE 1.35*   < > 1.20  CALCIUM 9.8   < > 9.6  MG 1.2*  --   --    < > = values in this interval not displayed.    Cardiac Enzymes No results for input(s): TROPONINI in the last 168 hours.  RADIOLOGY:  No results found.    Management plans discussed with the patient, family and they are in agreement.  CODE STATUS:     Code Status Orders  (From admission, onward)         Start     Ordered   10/19/18 2129  Full code  Continuous     10/19/18 2128         TOTAL TIME TAKING CARE OF THIS PATIENT: 40 minutes.    [name] M.D on 10/21/2018 at 3:43 PM  Between 7am to 6pm - Pager - [phone]  After 6pm go to [email or web address] - [employer] [facility]  Office  [phone]  CC: Primary care physician; [name], MD

## 2018-10-21 NOTE — Progress Notes (Signed)
[facility]  ROUNDING NOTE   Subjective:   K 5  Patient has no complaints.   Objective:  Vital signs in last 24 hours:  Temp:  [97.2 F (36.2 C)-97.9 F (36.6 C)] 97.2 F (36.2 C) (11/21 0747) Pulse Rate:  [65-83] 70 (11/21 0747) Resp:  [18-19] 19 (11/21 0747) BP: (108-134)/(51-63) 134/63 (11/21 0747) SpO2:  [93 %-97 %] 93 % (11/21 0747)  Weight change:  Filed Weights   10/19/18 1836 10/19/18 2133  Weight: 81.6 kg 81.1 kg    Intake/Output: I/O last 3 completed shifts: In: 2130 [P.O.:480; I.V.:1150; IV Piggyback:500] Out: 0626 [Urine:3250]   Intake/Output this shift:  Total I/O In: -  Out: 300 [Urine:300]  Physical Exam: General: NAD,   Head: Normocephalic, atraumatic. Moist oral mucosal membranes  Eyes: Anicteric, PERRL  Neck: Supple, trachea midline  Lungs:  Clear to auscultation  Heart: Regular rate and rhythm  Abdomen:  Soft, nontender,   Extremities: no peripheral edema.  Neurologic: Nonfocal, moving all four extremities  Skin: No lesions        Basic Metabolic Panel: Recent Labs  Lab 10/19/18 1837 10/19/18 1923 10/19/18 2227 10/20/18 0320 10/20/18 1449 10/20/18 2006 10/21/18 0344  NA 133* 132* 136 137  --   --  136  K 7.4* 7.5* 6.2* 6.1* 6.9* 6.3* 5.0  CL 107 108 108 110  --   --  105  CO2 23  --  23 23  --   --  27  GLUCOSE 100* 92 111* 186*  --   --  160*  BUN 44* 38* 39* 36*  --   --  24*  CREATININE 1.35* 1.50* 1.28* 1.21  --   --  1.20  CALCIUM 9.8  --  10.2 9.7  --   --  9.6  MG 1.2*  --   --   --   --   --   --     Liver Function Tests: No results for input(s): AST, ALT, ALKPHOS, BILITOT, PROT, ALBUMIN in the last 168 hours. No results for input(s): LIPASE, AMYLASE in the last 168 hours. No results for input(s): AMMONIA in the last 168 hours.  CBC: Recent Labs  Lab 10/19/18 1837 10/19/18 1923 10/19/18 2227 10/20/18 0320  WBC 4.8  --  5.4 4.4  HGB 10.2* 11.2* 10.2* 8.8*  HCT 32.4* 33.0* 32.1* 28.0*  MCV  93.4  --  92.2 92.4  PLT 201  --  181 155    Cardiac Enzymes: No results for input(s): CKTOTAL, CKMB, CKMBINDEX, TROPONINI in the last 168 hours.  BNP: Invalid input(s): POCBNP  CBG: Recent Labs  Lab 10/20/18 0801 10/20/18 1143 10/20/18 1636 10/20/18 2043 10/21/18 0735  GLUCAP 88 90 96 126* 115*    Microbiology: Results for orders placed or performed during the hospital encounter of 06/24/18  Culture, blood (Routine x 2)     Status: None   Collection Time: 06/24/18  9:29 PM  Result Value Ref Range Status   Specimen Description BLOOD LEFT ANTECUBITAL  Final   Special Requests   Final    BOTTLES DRAWN AEROBIC AND ANAEROBIC Blood Culture adequate volume   Culture   Final    NO GROWTH 5 DAYS Performed at [facility], [address] [place], [place] [address]    Report Status 06/29/2018 FINAL  Final  Culture, blood (Routine x 2)     Status: None   Collection Time: 06/24/18  9:31 PM  Result Value Ref Range Status  Specimen Description BLOOD BLOOD RIGHT FOREARM  Final   Special Requests   Final    BOTTLES DRAWN AEROBIC AND ANAEROBIC Blood Culture adequate volume   Culture   Final    NO GROWTH 5 DAYS Performed at [facility], [address] [place], [place] [address]    Report Status 06/29/2018 FINAL  Final    Coagulation Studies: No results for input(s): LABPROT, INR in the last 72 hours.  Urinalysis: No results for input(s): COLORURINE, LABSPEC, PHURINE, GLUCOSEU, HGBUR, BILIRUBINUR, KETONESUR, PROTEINUR, UROBILINOGEN, NITRITE, LEUKOCYTESUR in the last 72 hours.  Invalid input(s): APPERANCEUR    Imaging: No results found.   Medications:   .  sodium bicarbonate  infusion 1000 mL 100 mL/hr at 10/21/18 0411   . amLODipine  10 mg Oral QPM  . aspirin  81 mg Oral Daily  . atorvastatin  80 mg Oral QPM  . clorazepate  7.5 mg Oral QHS  . enoxaparin (LOVENOX) injection  40 mg Subcutaneous Q24H  . fluticasone  2 spray Each Nare QPM  .  furosemide  20 mg Oral BID  . gabapentin  300 mg Oral TID  . insulin aspart  0-5 Units Subcutaneous QHS  . insulin aspart  0-9 Units Subcutaneous TID WC  . loratadine  10 mg Oral Daily  . pantoprazole  40 mg Oral Daily  . patiromer  25.2 g Oral Daily  . terazosin  1 mg Oral QHS  . tiotropium  18 mcg Inhalation Daily  . [START ON 10/23/2018] Vitamin D (Ergocalciferol)  50,000 Units Oral Q Sat   acetaminophen **OR** acetaminophen, albuterol, ipratropium-albuterol, ondansetron **OR** ondansetron (ZOFRAN) IV  Assessment/ Plan:  Mr. Francisco Gibbs is a 84 y.o. white male with hypertension, hypomagnesemia, hyperlipidemia, GERD, diabetes mellitus type II, COPD, diabetic neuropathy. Admitted to [facility] on 10/19/2018 for Hyperkalemia [E87.5] AKI (acute kidney injury) ([place]) [N17.9]  1. Hyperkalemia 2. Acute renal failure 3. Chronic kidney disease stage III baseline creatinine of 1.81, GFR of 34 4. Hypertension  Plan Discontinued amiloride - offending agent Continue hydrochlorothiazide, furosemide, and quinapril Patient to have follow up labs with our office next week.  Patient to follow up with Nephrology, Dr. Candiss Norse on 12/6 at 9am   LOS: 2 Francisco Gibbs 11/21/201910:44 AM

## 2019-02-11 ENCOUNTER — Other Ambulatory Visit (HOSPITAL_COMMUNITY): Payer: Self-pay | Admitting: Family Medicine

## 2019-02-11 ENCOUNTER — Other Ambulatory Visit: Payer: Self-pay | Admitting: Family Medicine

## 2019-02-11 DIAGNOSIS — E041 Nontoxic single thyroid nodule: Secondary | ICD-10-CM

## 2019-02-14 ENCOUNTER — Ambulatory Visit: Payer: Medicare Other

## 2019-03-04 ENCOUNTER — Ambulatory Visit: Payer: Medicare Other

## 2019-05-06 ENCOUNTER — Ambulatory Visit
Admission: RE | Admit: 2019-05-06 | Discharge: 2019-05-06 | Disposition: A | Discharge status: Home / Self Care | Payer: Medicare Other | Source: Ambulatory Visit | Attending: Family Medicine | Admitting: Family Medicine

## 2019-05-06 ENCOUNTER — Other Ambulatory Visit: Payer: Self-pay

## 2019-05-06 DIAGNOSIS — E041 Nontoxic single thyroid nodule: Secondary | ICD-10-CM | POA: Diagnosis not present

## 2020-03-16 ENCOUNTER — Other Ambulatory Visit: Payer: Self-pay

## 2020-03-16 ENCOUNTER — Encounter: Payer: Self-pay | Admitting: Dermatology

## 2020-03-16 ENCOUNTER — Ambulatory Visit (INDEPENDENT_AMBULATORY_CARE_PROVIDER_SITE_OTHER): Payer: Medicare Other | Admitting: Dermatology

## 2020-03-16 DIAGNOSIS — C44319 Basal cell carcinoma of skin of other parts of face: Secondary | ICD-10-CM | POA: Diagnosis not present

## 2020-03-16 DIAGNOSIS — L821 Other seborrheic keratosis: Secondary | ICD-10-CM | POA: Diagnosis not present

## 2020-03-16 DIAGNOSIS — L820 Inflamed seborrheic keratosis: Secondary | ICD-10-CM

## 2020-03-16 DIAGNOSIS — C4491 Basal cell carcinoma of skin, unspecified: Secondary | ICD-10-CM

## 2020-03-16 DIAGNOSIS — C44311 Basal cell carcinoma of skin of nose: Secondary | ICD-10-CM | POA: Diagnosis not present

## 2020-03-16 DIAGNOSIS — D489 Neoplasm of uncertain behavior, unspecified: Secondary | ICD-10-CM

## 2020-03-16 DIAGNOSIS — Z1283 Encounter for screening for malignant neoplasm of skin: Secondary | ICD-10-CM

## 2020-03-16 DIAGNOSIS — D229 Melanocytic nevi, unspecified: Secondary | ICD-10-CM | POA: Diagnosis not present

## 2020-03-16 DIAGNOSIS — L814 Other melanin hyperpigmentation: Secondary | ICD-10-CM | POA: Diagnosis not present

## 2020-03-16 DIAGNOSIS — L578 Other skin changes due to chronic exposure to nonionizing radiation: Secondary | ICD-10-CM

## 2020-03-16 HISTORY — DX: Basal cell carcinoma of skin, unspecified: C44.91

## 2020-03-16 NOTE — Patient Instructions (Signed)
Shave Excision Benign Lesion Wound Care Instructions  . Leave the original bandage on for 24 hours if possible.  If the bandage becomes soaked or soiled before that time, it is OK to remove it and examine the wound.  A small amount of post-operative bleeding is normal.  If excessive bleeding occurs, remove the bandage, place gauze over the site and apply continuous pressure (no peeking) over the area for 20-30 minutes.  If this does not stop the bleeding, try again for 40 minutes.  If this does not work, please call our clinic as soon as possible (even if after-hours).    . Twice a day, cleanse the wound with soap and water.  If a thick crust develops you may use a Q-tip dipped into dilute hydrogen peroxide (mix 1:1 with water) to dissolve it.  Hydrogen peroxide can slow the healing process, so use it only as needed.  After washing, apply Vaseline jelly or Polysporin ointment.  For best healing, the wound should be covered with a layer of ointment at all times.  This may mean re-applying the ointment several times a day.  For open wounds, continue until it has healed.    . If you have any swelling, keep the area elevated.  . Some redness, tenderness and white or yellow material in the wound is normal healing.  If the area becomes very sore and red, or develops a thick yellow-green material (pus), it may be infected; please notify us.    . Wound healing continues for up to one year following surgery.  It is not unusual to experience pain in the scar from time to time during the interval.  If the pain becomes severe or the scar thickens, you should notify the office.  A slight amount of redness in a scar is expected for the first six months.  After six months, the redness subsides and the scar will soften and fade.  The color difference becomes less noticeable with time.  If there are any problems, return for a post-op surgery check at your earliest convenience.  . Please call our office for any questions  or concerns.   Recommend daily broad spectrum sunscreen SPF 30+ to sun-exposed areas, reapply every 2 hours as needed. Call for new or changing lesions.

## 2020-03-16 NOTE — Progress Notes (Deleted)
   Follow-Up Visit   Subjective  Francisco Gibbs is a 86 y.o. male who presents for the following: larg spot (on tip of left side of nose). Has a spot on the left tip of his nose for over a year, use to go away , in last 2 months will not go away. Has not used anything on this spot  The following portions of the chart were reviewed this encounter and updated as appropriate:     Review of Systems: No other skin or systemic complaints.  Objective  Well appearing patient in no apparent distress; mood and affect are within normal limits.  A focused examination was performed including nose. Relevant physical exam findings are noted in the Assessment and Plan.  No skin findings found.  Assessment & Plan   No follow-ups on file.   IDonzetta Kohut, CMA, am acting as scribe for Forest Gleason, MD .

## 2020-03-16 NOTE — Progress Notes (Signed)
New Patient Visit  Subjective  Francisco Gibbs is a 86 y.o. male who presents for the following: check spots.  He reports a large spot on the tip of left side of nose. It has been present for more than a year and is not itchy. He has not had any treatment. He also reports some other spots on the body.   The following portions of the chart were reviewed this encounter and updated as appropriate: medications, allergies, past medical history, social history  Review of Systems:  No other skin or systemic complaints except as noted in HPI or Assessment and Plan.    Objective  Well appearing patient in no apparent distress; mood and affect are within normal limits.  All skin waist up examined.  Objective  Left dorsal forearm: Erythematous keratotic or waxy stuck-on papule or plaque.   Images      Objective  Right Tip of Nose: 1.1 cm eroded pink plaque       Objective  Right Preauricular Area: 0.9 eroded pink papule       Assessment & Plan  Inflamed seborrheic keratosis Left dorsal forearm  Favor over actinic keratosis Will recheck in 4 to 6 weeks  Destruction of lesion - Left dorsal forearm  Destruction method: cryotherapy   Informed consent: discussed and consent obtained   Lesion destroyed using liquid nitrogen: Yes   Region frozen until ice ball extended beyond lesion: Yes   Outcome: patient tolerated procedure well with no complications   Post-procedure details: wound care instructions given    Neoplasm of uncertain behavior (2) Right Tip of Nose  Skin / nail biopsy Type of biopsy: tangential   Informed consent: discussed and consent obtained   Timeout: patient name, date of birth, surgical site, and procedure verified   Procedure prep:  Patient was prepped and draped in usual sterile fashion Prep type:  Isopropyl alcohol Anesthesia: the lesion was anesthetized in a standard fashion   Anesthetic:  1% lidocaine w/ epinephrine 1-100,000 buffered w/  8.4% NaHCO3 Instrument used: flexible razor blade   Hemostasis achieved with: aluminum chloride   Outcome: patient tolerated procedure well   Post-procedure details: sterile dressing applied and wound care instructions given   Dressing type: petrolatum and pressure dressing    Specimen 1 - Surgical pathology Differential Diagnosis: R/O BCC Check Margins: No  Right Preauricular Area  Skin / nail biopsy Type of biopsy: tangential   Informed consent: discussed and consent obtained   Timeout: patient name, date of birth, surgical site, and procedure verified   Procedure prep:  Patient was prepped and draped in usual sterile fashion Prep type:  Isopropyl alcohol Anesthesia: the lesion was anesthetized in a standard fashion   Anesthetic:  1% lidocaine w/ epinephrine 1-100,000 buffered w/ 8.4% NaHCO3 Instrument used: flexible razor blade   Hemostasis achieved with: aluminum chloride   Outcome: patient tolerated procedure well   Post-procedure details: sterile dressing applied and wound care instructions given   Dressing type: petrolatum and pressure dressing    Specimen 2 - Surgical pathology Differential Diagnosis: R/O Bcc Check Margins: No  R/O BCC vs other   Melanocytic Nevi - Tan-brown and/or pink-flesh-colored symmetric macules and papules - Benign appearing on exam today - Observation - Call clinic for new or changing moles - Recommend daily use of broad spectrum spf 30+ sunscreen to sun-exposed areas.    Lentigines - Scattered tan macules - Discussed due to sun exposure - Benign, observe - Call for any changes  Seborrheic Keratoses - Stuck-on, waxy, tan-brown papules and plaques  - Discussed benign etiology and prognosis. - Observe - Call for any changes  Actinic Damage - diffuse scaly erythematous macules with underlying dyspigmentation - Recommend daily broad spectrum sunscreen SPF 30+ to sun-exposed areas, reapply every 2 hours as needed.  - Call for new or  changing lesions.   Skin cancer screening performed today of the upper body   Return 4 to 6 weeks, for  ISK and Biopsy follow up.   IDonzetta Kohut, CMA, am acting as scribe for Forest Gleason, MD .  Documentation: I have reviewed the above documentation for accuracy and completeness, and I agree with the above.  Forest Gleason, MD

## 2020-03-21 ENCOUNTER — Telehealth: Payer: Self-pay | Admitting: Dermatology

## 2020-03-21 NOTE — Progress Notes (Signed)
1. 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 Skin , right preauricular area BASAL CELL CARCINOMA, NODULAR PATTERN --> Mohs surgery  Recommend lower body skin exam at follow-up as well.   Dr. Jerilynn Mages called 03/21/2020 at 1 pm, no answer, left voicemail.  Will call back at mobile after lunch.

## 2020-03-21 NOTE — Telephone Encounter (Signed)
1. 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 Skin , right preauricular area BASAL CELL CARCINOMA, NODULAR PATTERN --> Mohs surgery  Recommend lower body skin exam at follow-up as well.   Spoke with patient. He is in agreement with Mohs surgery. Will refer to Dr. Manley Mason at [facility]. His sister has also been advised of the results and is comfortable traveling to [facility] for the surgery. All questions answered.   Estill Bamberg, can you please place the referral for both sites? Thank you!

## 2020-03-22 ENCOUNTER — Encounter: Payer: Self-pay | Admitting: Dermatology

## 2020-04-02 ENCOUNTER — Other Ambulatory Visit: Payer: Self-pay

## 2020-04-02 DIAGNOSIS — C44311 Basal cell carcinoma of skin of nose: Secondary | ICD-10-CM

## 2020-04-02 DIAGNOSIS — C44319 Basal cell carcinoma of skin of other parts of face: Secondary | ICD-10-CM

## 2020-04-02 NOTE — Telephone Encounter (Signed)
Referral has been sent to Hershal Coria at [facility].

## 2020-04-02 NOTE — Progress Notes (Signed)
Referral to [facility] for [facility] with Dr. Manley Mason.

## 2020-04-09 DIAGNOSIS — N1832 Chronic kidney disease, stage 3b: Secondary | ICD-10-CM | POA: Insufficient documentation

## 2020-04-09 DIAGNOSIS — N400 Enlarged prostate without lower urinary tract symptoms: Secondary | ICD-10-CM | POA: Insufficient documentation

## 2020-05-09 ENCOUNTER — Ambulatory Visit: Payer: Medicare Other | Admitting: Dermatology

## 2020-05-25 ENCOUNTER — Other Ambulatory Visit: Payer: Self-pay | Admitting: Nephrology

## 2020-05-25 DIAGNOSIS — N1832 Chronic kidney disease, stage 3b: Secondary | ICD-10-CM

## 2020-06-07 ENCOUNTER — Other Ambulatory Visit: Payer: Self-pay

## 2020-06-07 ENCOUNTER — Ambulatory Visit
Admission: RE | Admit: 2020-06-07 | Discharge: 2020-06-07 | Disposition: A | Discharge status: Home / Self Care | Payer: Medicare Other | Source: Ambulatory Visit | Attending: Nephrology | Admitting: Nephrology

## 2020-06-07 DIAGNOSIS — N1832 Chronic kidney disease, stage 3b: Secondary | ICD-10-CM | POA: Diagnosis not present

## 2020-07-12 ENCOUNTER — Telehealth: Payer: Self-pay | Admitting: Dermatology

## 2020-07-12 ENCOUNTER — Ambulatory Visit: Payer: Medicare Other | Admitting: Dermatology

## 2020-07-12 NOTE — Telephone Encounter (Signed)
Called patient's mobile number to touch base and see how he is doing since Mohs surgery. We have not seen him back in clinic. His sister answered and notes she usually helps him with appointments due to his hearing loss. She says he has not wanted to come back to the clinic and wants to avoid medical appointments in general right now. She reports that he did very well with the Mohs surgery. She suggested I try reaching him at the home number and speak with him directly if he is able to hear me. I tried that number but there was no answer. I'll try again next week and see if I can reach him.

## 2020-09-19 ENCOUNTER — Ambulatory Visit: Admit: 2020-09-19 | Payer: Medicare Other | Admitting: Ophthalmology

## 2020-09-19 SURGERY — PHACOEMULSIFICATION, CATARACT, WITH IOL INSERTION
Anesthesia: Topical | Laterality: Left

## 2021-01-15 ENCOUNTER — Encounter (INDEPENDENT_AMBULATORY_CARE_PROVIDER_SITE_OTHER): Payer: Self-pay | Admitting: Vascular Surgery

## 2021-01-15 ENCOUNTER — Ambulatory Visit (INDEPENDENT_AMBULATORY_CARE_PROVIDER_SITE_OTHER): Payer: Medicare Other | Admitting: Vascular Surgery

## 2021-01-15 ENCOUNTER — Other Ambulatory Visit: Payer: Self-pay

## 2021-01-15 VITALS — BP 164/61 | HR 75 | Resp 16 | Ht 66.0 in | Wt 184.0 lb

## 2021-01-15 DIAGNOSIS — N1832 Chronic kidney disease, stage 3b: Secondary | ICD-10-CM

## 2021-01-15 DIAGNOSIS — E785 Hyperlipidemia, unspecified: Secondary | ICD-10-CM

## 2021-01-15 DIAGNOSIS — I6523 Occlusion and stenosis of bilateral carotid arteries: Secondary | ICD-10-CM

## 2021-01-15 DIAGNOSIS — I10 Essential (primary) hypertension: Secondary | ICD-10-CM

## 2021-01-15 DIAGNOSIS — I6529 Occlusion and stenosis of unspecified carotid artery: Secondary | ICD-10-CM | POA: Insufficient documentation

## 2021-01-15 NOTE — Progress Notes (Signed)
Patient ID: Francisco Gibbs, male   DOB: [birth year], 87 y.o.   MRN: [id number]  Chief Complaint  Patient presents with  . New Patient (Initial Visit)    Ref for Carotid Stenosis    HPI Francisco Gibbs is a 87 y.o. male.  I am asked to see the patient by D. White for evaluation of carotid stenosis.  The patient reports no focal symptoms of cerebrovascular ischemia.  His wife provides much of the history as he is quite hard of hearing.  He had some benign neck mass which has been followed regularly with ultrasounds.  His wife says that as far she knows he had no previous documented carotid stenosis.  The most recent ultrasound demonstrated 70-99% right ICA stenosis and 50-69 left ICA stenosis.  Given these findings, he is referred for further evaluation and treatment.  He is on appropriate medical therapy with aspirin and a statin agent.  No focal neurologic symptoms. Specifically, the patient denies amaurosis fugax, speech or swallowing difficulties, or arm or leg weakness or numbness    Past Medical History:  Diagnosis Date  . Basal cell carcinoma 03/16/2020   R tip of nose, R preauricular area  . COPD (chronic obstructive pulmonary disease) ([place])   . Diabetes mellitus without complication ([place])    Pt takes Metformin.  Marland Kitchen GERD (gastroesophageal reflux disease)   . HLD (hyperlipidemia)   . Hypertension     Past Surgical History:  Procedure Laterality Date  . HYDROCELE EXCISION / REPAIR    . INGUINAL HERNIA REPAIR    . KNEE SURGERY      Family History  Problem Relation Age of Onset  . Diabetes Sister   . Diabetes Paternal Aunt   no bleeding or clotting disorders   Social History   Tobacco Use  . Smoking status: Former Smoker    Packs/day: 1.00    Years: 70.00    Pack years: 70.00    Types: Cigarettes  . Smokeless tobacco: Never Used  Substance Use Topics  . Alcohol use: Not Currently  . Drug use: Never     Allergies  Allergen Reactions  . Guaifenesin Shortness Of  Breath    Other reaction(s): Other (See Comments)  . Penicillin G Rash and Shortness Of Breath    Has patient had a PCN reaction causing immediate rash, facial/tongue/throat swelling, SOB or lightheadedness with hypotension: Yes Has patient had a PCN reaction causing severe rash involving mucus membranes or skin necrosis: No Has patient had a PCN reaction that required hospitalization: No Has patient had a PCN reaction occurring within the last 10 years: No If all of the above answers are "NO", then may proceed with Cephalosporin use. Has patient had a PCN reaction causing immediate rash, facial/tongue/throat swelling, SOB or lightheadedness with hypotension: Yes Has patient had a PCN reaction causing severe rash involving mucus membranes or skin necrosis: No Has patient had a PCN reaction that required hospitalization: No Has patient had a PCN reaction occurring within the last 10 years: No If all of the above answers are "NO", then may proceed with Cephalosporin use. Has patient had a PCN reaction causing immediate rash, facial/tongue/throat swelling, SOB or lightheadedness with hypotension: Yes Has patient had a PCN reaction causing severe rash involving mucus membranes or skin necrosis: No Has patient had a PCN reaction that required hospitalization: No Has patient had a PCN reaction occurring within the last 10 years: No If all of the above answers are "NO", then  may proceed with Cephalosporin use. Has patient had a PCN reaction causing immediate rash, facial/tongue/throat swelling, SOB or lightheadedness with hypotension: Yes Has patient had a PCN reaction causing severe rash involving mucus membranes or skin necrosis: No Has patient had a PCN reaction that required hospitalization: No Has patient had a PCN reaction occurring within the last 10 years: NoIf all of the above answers are "NO", then may proceed with Cephalosporin use. Has patient had a PCN reaction causing immediate rash,  facial/tongue/throat swelling, SOB or lightheadedness with hypotension: Yes Has patient had a PCN reaction causing se... (TRUNCATED)  . Sulfa Antibiotics Shortness Of Breath    Current Outpatient Medications  Medication Sig Dispense Refill  . albuterol (PROVENTIL HFA;VENTOLIN HFA) 108 (90 Base) MCG/ACT inhaler Inhale 2 puffs into the lungs every 4 (four) hours as needed for wheezing or shortness of breath.    Marland Kitchen amLODipine (NORVASC) 10 MG tablet Take 10 mg by mouth every evening.     Marland Kitchen aspirin 81 MG chewable tablet Chew 81 mg by mouth daily.     Marland Kitchen atorvastatin (LIPITOR) 80 MG tablet Take 80 mg by mouth every evening.   1  . Calcium Carbonate Antacid (CALCIUM CARBONATE PO) Take 1 tablet by mouth daily.    . cetirizine (ZYRTEC) 10 MG tablet Take 10 mg by mouth every evening.   1  . clorazepate (TRANXENE) 7.5 MG tablet Take 7.5 mg by mouth at bedtime.     . fluticasone (FLONASE) 50 MCG/ACT nasal spray Place 2 sprays into the nose every evening.     . Fluticasone-Umeclidin-Vilant (TRELEGY ELLIPTA) 100-62.5-25 MCG/INH AEPB Inhale into the lungs.    . furosemide (LASIX) 40 MG tablet Take 0.5 tablets (20 mg total) by mouth daily. (Patient taking differently: Take 20 mg by mouth as needed. Take 1 tablet by mouth once as needed for edema) 30 tablet 11  . gabapentin (NEURONTIN) 300 MG capsule Take 300 mg by mouth 3 (three) times daily.     . hydrochlorothiazide (HYDRODIURIL) 25 MG tablet Take 25 mg by mouth every evening.     Marland Kitchen ipratropium-albuterol (DUONEB) 0.5-2.5 (3) MG/3ML SOLN Take 3 mLs by nebulization every 6 (six) hours as needed. (Patient taking differently: Take 3 mLs by nebulization every 6 (six) hours as needed (for shortness of breath/wheezing).) 360 mL 0  . magnesium oxide (MAG-OX) 400 (241.3 Mg) MG tablet Take 1 tablet (400 mg total) by mouth daily. (Patient taking differently: Take 400 mg by mouth 2 (two) times daily.) 5 tablet 0  . metFORMIN (GLUCOPHAGE-XR) 500 MG 24 hr tablet Take 1,000  mg by mouth 2 (two) times daily.    Marland Kitchen omeprazole (PRILOSEC) 40 MG capsule Take 40 mg by mouth daily.   1  . quinapril (ACCUPRIL) 40 MG tablet Take 40 mg by mouth daily.   1  . sertraline (ZOLOFT) 25 MG tablet Take 50 mg by mouth daily.    . sitaGLIPtin (JANUVIA) 50 MG tablet Take by mouth.    . terazosin (HYTRIN) 1 MG capsule Take 1 mg by mouth at bedtime.   1  . terazosin (HYTRIN) 2 MG capsule     . Vitamin D, Ergocalciferol, (DRISDOL) 50000 units CAPS capsule Take 50,000 Units by mouth every Saturday.   3  . tiotropium (SPIRIVA) 18 MCG inhalation capsule Place 18 mcg into inhaler and inhale daily. (Patient not taking: Reported on 01/15/2021)     No current facility-administered medications for this visit.      REVIEW OF SYSTEMS (  Negative unless checked)  Constitutional: [] Weight loss  [] Fever  [] Chills Cardiac: [] Chest Gibbs   [] Chest pressure   [] Palpitations   [] Shortness of breath when laying flat   [] Shortness of breath at rest   [] Shortness of breath with exertion. Vascular:  [] Gibbs in legs with walking   [] Gibbs in legs at rest   [] Gibbs in legs when laying flat   [] Claudication   [] Gibbs in feet when walking  [] Gibbs in feet at rest  [] Gibbs in feet when laying flat   [] History of DVT   [] Phlebitis   [] Swelling in legs   [] Varicose veins   [] Non-healing ulcers Pulmonary:   [] Uses home oxygen   [] Productive cough   [] Hemoptysis   [] Wheeze  [x] COPD   [] Asthma Neurologic:  [] Dizziness  [] Blackouts   [] Seizures   [] History of stroke   [] History of TIA  [] Aphasia   [] Temporary blindness   [] Dysphagia   [] Weakness or numbness in arms   [] Weakness or numbness in legs Musculoskeletal:  [x] Arthritis   [] Joint swelling   [x] Joint Gibbs   [] Low back Gibbs Hematologic:  [] Easy bruising  [] Easy bleeding   [] Hypercoagulable state   [] Anemic  [] Hepatitis Gastrointestinal:  [] Blood in stool   [] Vomiting blood  [x] Gastroesophageal reflux/heartburn   [] Abdominal Gibbs Genitourinary:  [] Chronic kidney disease    [] Difficult urination  [] Frequent urination  [] Burning with urination   [] Hematuria Skin:  [] Rashes   [] Ulcers   [] Wounds Psychological:  [] History of anxiety   []  History of major depression.    Physical Exam BP (!) 164/61 (BP Location: Right Arm)   Pulse 75   Resp 16   Ht 5\' 6"  (1.676 m)   Wt 184 lb (83.5 kg)   BMI 29.70 kg/m  Gen:  WD/WN, NAD. Appears younger than stated age. Head: [place]/AT, No temporalis wasting.  Ear/Nose/Throat: Hearing diminished, nares w/o erythema or drainage, oropharynx w/o Erythema/Exudate Eyes: Conjunctiva clear, sclera non-icteric  Neck: trachea midline. Bilateral bruits Pulmonary:  Good air movement, clear to auscultation bilaterally.  Cardiac: RRR, normal S1, S2 Vascular:  Vessel Right Left  Radial Palpable Palpable                   Musculoskeletal: M/S 5/5 throughout.  Extremities without ischemic changes.  No deformity or atrophy. No edema. Neurologic: Sensation grossly intact in extremities.  Symmetrical.  Speech is fluent. Motor exam as listed above. Psychiatric: Judgment intact, Mood & affect appropriate for pt's clinical situation. Dermatologic: No rashes or ulcers noted.  No cellulitis or open wounds. Lymph : No Cervical, Axillary, or Inguinal lymphadenopathy.   Radiology No results found.  Labs No results found for this or any previous visit (from the past 2160 hour(s)).  Assessment/Plan:  Carotid stenosis His carotid duplex suggest 70 to 99% right ICA stenosis and 50-69 left ICA stenosis.  No focal symptoms of cerebrovascular ischemia.  The patient remains asymptomatic with respect to the carotid stenosis.  However, the patient has now progressed and has a lesion the is >70%.  Patient should undergo CT angiography of the carotid arteries to define the degree of stenosis of the internal carotid arteries bilaterally and the anatomic suitability for surgery vs. intervention.  If the patient does indeed need surgery cardiac  clearance will be required, once cleared the patient will be scheduled for surgery.  The risks, benefits and alternative therapies were reviewed in detail with the patient.  All questions were answered.  The patient agrees to proceed with imaging.  Continue antiplatelet  therapy as prescribed. Continue management of CAD, HTN and Hyperlipidemia. Healthy heart diet, encouraged exercise at least 4 times per week.    Essential hypertension blood pressure control important in reducing the progression of atherosclerotic disease. On appropriate oral medications.   Stage 3b chronic kidney disease ([place]) Hopefully renal dysfunction will not be prohibitive getting the CT scan.  If it is, we will have to do a catheter-based angiogram.  Hyperlipidemia lipid control important in reducing the progression of atherosclerotic disease. Continue statin therapy       Francisco Gibbs 01/15/2021, 12:01 PM   This note was created with Dragon medical transcription system.  Any errors from dictation are unintentional.

## 2021-01-15 NOTE — Assessment & Plan Note (Signed)
lipid control important in reducing the progression of atherosclerotic disease. Continue statin therapy  

## 2021-01-15 NOTE — Assessment & Plan Note (Signed)
Hopefully renal dysfunction will not be prohibitive getting the CT scan.  If it is, we will have to do a catheter-based angiogram.

## 2021-01-15 NOTE — Patient Instructions (Signed)
Carotid Artery Disease  Carotid artery disease is the narrowing or blockage of one or both carotid arteries. This condition is also called carotid artery stenosis. The carotid arteries are the two main blood vessels on either side of the neck. They send blood to the brain, other parts of the head, and the neck.  This condition increases your risk for a stroke or a transient ischemic attack (TIA). A TIA is a "mini-stroke" that causes stroke-like symptoms that go away quickly. What are the causes? This condition is mainly caused by a narrowing and hardening of the carotid arteries. The carotid arteries can become narrow or clogged with a buildup of plaque. Plaque includes:  Fat.  Cholesterol.  Calcium.  Other substances. What increases the risk? The following factors may make you more likely to develop this condition:  Having certain medical conditions, such as: ? High cholesterol. ? High blood pressure. ? Diabetes. ? Obesity.  Smoking.  A family history of cardiovascular disease.  Not being active or lack of regular exercise.  Being male. Men have a higher risk of having arteries become narrow and harden earlier in life than women.  Old age. What are the signs or symptoms? This condition may not have any signs or symptoms until a stroke or TIA happens. In some cases, your doctor may be able to hear a whooshing sound. This can suggest a change in blood flow caused by plaque buildup. An eye exam can also help find signs of the condition. How is this treated? This condition may be treated with more than one treatment. Treatment options include:  Lifestyle changes, such as: ? Quitting smoking. ? Getting regular exercise, or getting exercise as told by your doctor. ? Eating a healthy diet. ? Managing stress. ? Keeping a healthy weight.  Medicines to control: ? Blood pressure. ? Cholesterol. ? Blood clotting.  Surgery. You may have: ? A surgery to remove the blockages in  the carotid arteries. ? A procedure in which a small mesh tube (stent) is used to widen the blocked carotid arteries. Follow these instructions at home: Eating and drinking Follow instructions about your diet from your doctor. It is important to follow a healthy diet.  Eat a diet that includes: ? A lot of fresh fruits and vegetables. ? Low-fat (lean) meats.  Avoid these foods: ? Foods that are high in fat. ? Foods that are high in salt (sodium). ? Foods that are fried. ? Foods that are processed. ? Foods that have few good nutrients (poor nutritional value).   Lifestyle  Keep a healthy weight.  Do exercises as told by your doctor to stay active. Each week, you should get one of the following: ? At least 150 minutes of exercise that raises your heart rate and makes you sweat (moderate-intensity exercise). ? At least 75 minutes of exercise that takes a lot of effort.  Do not use any products that contain nicotine or tobacco, such as cigarettes, e-cigarettes, and chewing tobacco. If you need help quitting, ask your doctor.  Do not drink alcohol if: ? Your doctor tells you not to drink. ? You are pregnant, may be pregnant, or are planning to become pregnant.  If you drink alcohol: ? Limit how much you use to:  0-1 drink a day for women.  0-2 drinks a day for men. ? Be aware of how much alcohol is in your drink. In the [place] one drink equals one 12 oz bottle of beer (355 mL), one 5   oz glass of wine (148 mL), or one 1 oz glass of hard liquor (44 mL).  Do not use drugs.  Manage your stress. Ask your doctor for tips on how to do this.   General instructions  Take over-the-counter and prescription medicines only as told by your doctor.  Keep all follow-up visits as told by your doctor. This is important. Where to find more information  American Heart Association: www.heart.org Get help right away if:  You have any signs of a stroke. "BE FAST" is an easy way to remember the  main warning signs: ? B - Balance. Signs are dizziness, sudden trouble walking, or loss of balance. ? E - Eyes. Signs are trouble seeing or a change in how you see. ? F - Face. Signs are sudden weakness or loss of feeling of the face, or the face or eyelid drooping on one side. ? A - Arms. Signs are weakness or loss of feeling in an arm. This happens suddenly and usually on one side of the body. ? S - Speech. Signs are sudden trouble speaking, slurred speech, or trouble understanding what people say. ? T - Time. Time to call emergency services. Write down what time symptoms started.  You have other signs of a stroke, such as: ? A sudden, very bad headache with no known cause. ? Feeling like you may vomit (nausea). ? Vomiting. ? A seizure. These symptoms may be an emergency. Do not wait to see if the symptoms will go away. Get medical help right away. Call your local emergency services (911 in the [place] Do not drive yourself to the hospital. Summary  The carotid arteries are blood vessels on both sides of the neck.  If these arteries get smaller or get blocked, you are more likely to have a stroke or a mini-stroke.  This condition can be treated with lifestyle changes, medicines, surgery, or a blend of these treatments.  Get help right away if you have any signs of a stroke. "BE FAST" is an easy way to remember the main warning signs of stroke. This information is not intended to replace advice given to you by your health care provider. Make sure you discuss any questions you have with your health care provider. Document Revised: 06/13/2019 Document Reviewed: 06/13/2019 Elsevier Patient Education  2021 [employer]  

## 2021-01-15 NOTE — Assessment & Plan Note (Signed)
His carotid duplex suggest 70 to 99% right ICA stenosis and 50-69 left ICA stenosis.  No focal symptoms of cerebrovascular ischemia.  The patient remains asymptomatic with respect to the carotid stenosis.  However, the patient has now progressed and has a lesion the is >70%.  Patient should undergo CT angiography of the carotid arteries to define the degree of stenosis of the internal carotid arteries bilaterally and the anatomic suitability for surgery vs. intervention.  If the patient does indeed need surgery cardiac clearance will be required, once cleared the patient will be scheduled for surgery.  The risks, benefits and alternative therapies were reviewed in detail with the patient.  All questions were answered.  The patient agrees to proceed with imaging.  Continue antiplatelet therapy as prescribed. Continue management of CAD, HTN and Hyperlipidemia. Healthy heart diet, encouraged exercise at least 4 times per week.

## 2021-01-15 NOTE — Assessment & Plan Note (Signed)
blood pressure control important in reducing the progression of atherosclerotic disease. On appropriate oral medications.  

## 2021-01-18 ENCOUNTER — Other Ambulatory Visit
Admission: RE | Admit: 2021-01-18 | Discharge: 2021-01-18 | Disposition: A | Discharge status: Home / Self Care | Payer: Medicare Other | Source: Ambulatory Visit | Attending: Internal Medicine | Admitting: Internal Medicine

## 2021-01-18 DIAGNOSIS — I350 Nonrheumatic aortic (valve) stenosis: Secondary | ICD-10-CM | POA: Diagnosis present

## 2021-01-18 DIAGNOSIS — I10 Essential (primary) hypertension: Secondary | ICD-10-CM | POA: Insufficient documentation

## 2021-01-18 DIAGNOSIS — J449 Chronic obstructive pulmonary disease, unspecified: Secondary | ICD-10-CM | POA: Diagnosis present

## 2021-01-18 DIAGNOSIS — Z01818 Encounter for other preprocedural examination: Secondary | ICD-10-CM | POA: Insufficient documentation

## 2021-01-18 LAB — BRAIN NATRIURETIC PEPTIDE: B Natriuretic Peptide: 817.8 pg/mL — ABNORMAL HIGH (ref 0.0–100.0)

## 2021-01-28 ENCOUNTER — Other Ambulatory Visit: Payer: Self-pay

## 2021-01-28 ENCOUNTER — Other Ambulatory Visit
Admission: RE | Admit: 2021-01-28 | Discharge: 2021-01-28 | Disposition: A | Discharge status: Home / Self Care | Payer: Medicare Other | Source: Ambulatory Visit | Attending: Internal Medicine | Admitting: Internal Medicine

## 2021-01-28 DIAGNOSIS — Z01812 Encounter for preprocedural laboratory examination: Secondary | ICD-10-CM | POA: Insufficient documentation

## 2021-01-28 DIAGNOSIS — Z20822 Contact with and (suspected) exposure to covid-19: Secondary | ICD-10-CM | POA: Insufficient documentation

## 2021-01-29 LAB — SARS CORONAVIRUS 2 (TAT 6-24 HRS): SARS Coronavirus 2: NEGATIVE

## 2021-01-30 ENCOUNTER — Encounter: Payer: Self-pay | Admitting: Internal Medicine

## 2021-01-30 ENCOUNTER — Telehealth (INDEPENDENT_AMBULATORY_CARE_PROVIDER_SITE_OTHER): Payer: Self-pay | Admitting: Vascular Surgery

## 2021-01-30 ENCOUNTER — Ambulatory Visit
Admission: RE | Admit: 2021-01-30 | Discharge: 2021-01-30 | Disposition: A | Discharge status: Home / Self Care | Payer: Medicare Other | Attending: Internal Medicine | Admitting: Internal Medicine

## 2021-01-30 ENCOUNTER — Encounter
Admission: RE | Disposition: A | Discharge status: Home / Self Care | Payer: Self-pay | Source: Home / Self Care | Attending: Internal Medicine

## 2021-01-30 ENCOUNTER — Other Ambulatory Visit: Payer: Self-pay

## 2021-01-30 DIAGNOSIS — I2510 Atherosclerotic heart disease of native coronary artery without angina pectoris: Secondary | ICD-10-CM | POA: Insufficient documentation

## 2021-01-30 DIAGNOSIS — I129 Hypertensive chronic kidney disease with stage 1 through stage 4 chronic kidney disease, or unspecified chronic kidney disease: Secondary | ICD-10-CM | POA: Diagnosis not present

## 2021-01-30 DIAGNOSIS — R0609 Other forms of dyspnea: Secondary | ICD-10-CM | POA: Diagnosis not present

## 2021-01-30 DIAGNOSIS — E1122 Type 2 diabetes mellitus with diabetic chronic kidney disease: Secondary | ICD-10-CM | POA: Diagnosis not present

## 2021-01-30 DIAGNOSIS — E1151 Type 2 diabetes mellitus with diabetic peripheral angiopathy without gangrene: Secondary | ICD-10-CM | POA: Diagnosis not present

## 2021-01-30 DIAGNOSIS — N183 Chronic kidney disease, stage 3 unspecified: Secondary | ICD-10-CM | POA: Diagnosis not present

## 2021-01-30 DIAGNOSIS — I350 Nonrheumatic aortic (valve) stenosis: Secondary | ICD-10-CM | POA: Diagnosis present

## 2021-01-30 HISTORY — PX: LEFT HEART CATH AND CORONARY ANGIOGRAPHY: CATH118249

## 2021-01-30 HISTORY — DX: Nonrheumatic aortic (valve) stenosis: I35.0

## 2021-01-30 SURGERY — LEFT HEART CATH AND CORONARY ANGIOGRAPHY
Anesthesia: Moderate Sedation | Laterality: Left

## 2021-01-30 MED ORDER — MIDAZOLAM HCL 2 MG/2ML IJ SOLN
INTRAMUSCULAR | Status: DC | PRN
  Administered 2021-01-30: 1 mg via INTRAVENOUS

## 2021-01-30 MED ORDER — VERAPAMIL HCL 2.5 MG/ML IV SOLN
INTRAVENOUS | Status: AC
  Filled 2021-01-30: qty 2

## 2021-01-30 MED ORDER — SODIUM CHLORIDE 0.9% FLUSH
3.0000 mL | Freq: Two times a day (BID) | INTRAVENOUS | Status: DC
  Administered 2021-01-30: 3 mL via INTRAVENOUS

## 2021-01-30 MED ORDER — SODIUM CHLORIDE 0.9% FLUSH: 3.0000 mL | INTRAVENOUS | Status: DC | PRN

## 2021-01-30 MED ORDER — SODIUM CHLORIDE 0.9 % IV SOLN: 250.0000 mL | INTRAVENOUS | Status: DC | PRN

## 2021-01-30 MED ORDER — HYDRALAZINE HCL 20 MG/ML IJ SOLN: 10.0000 mg | INTRAMUSCULAR | Status: DC | PRN

## 2021-01-30 MED ORDER — HEPARIN SODIUM (PORCINE) 1000 UNIT/ML IJ SOLN
INTRAMUSCULAR | Status: DC | PRN
  Administered 2021-01-30: 4000 [IU] via INTRAVENOUS

## 2021-01-30 MED ORDER — FENTANYL CITRATE (PF) 100 MCG/2ML IJ SOLN
INTRAMUSCULAR | Status: AC
  Filled 2021-01-30: qty 2

## 2021-01-30 MED ORDER — HEPARIN SODIUM (PORCINE) 1000 UNIT/ML IJ SOLN
INTRAMUSCULAR | Status: AC
  Filled 2021-01-30: qty 1

## 2021-01-30 MED ORDER — MIDAZOLAM HCL 2 MG/2ML IJ SOLN
INTRAMUSCULAR | Status: AC
  Filled 2021-01-30: qty 2

## 2021-01-30 MED ORDER — SODIUM CHLORIDE 0.9 % WEIGHT BASED INFUSION
1.0000 mL/kg/h | INTRAVENOUS | Status: DC
  Administered 2021-01-30: 1 mL/kg/h via INTRAVENOUS

## 2021-01-30 MED ORDER — LABETALOL HCL 5 MG/ML IV SOLN: 10.0000 mg | INTRAVENOUS | Status: DC | PRN

## 2021-01-30 MED ORDER — ACETAMINOPHEN 325 MG PO TABS: 650.0000 mg | ORAL_TABLET | ORAL | Status: DC | PRN

## 2021-01-30 MED ORDER — VERAPAMIL HCL 2.5 MG/ML IV SOLN
INTRAVENOUS | Status: DC | PRN
  Administered 2021-01-30: 2.5 mg via INTRA_ARTERIAL

## 2021-01-30 MED ORDER — HEPARIN (PORCINE) IN NACL 1000-0.9 UT/500ML-% IV SOLN
INTRAVENOUS | Status: DC | PRN
  Administered 2021-01-30: 500 mL

## 2021-01-30 MED ORDER — ONDANSETRON HCL 4 MG/2ML IJ SOLN: 4.0000 mg | Freq: Four times a day (QID) | INTRAMUSCULAR | Status: DC | PRN

## 2021-01-30 MED ORDER — FENTANYL CITRATE (PF) 100 MCG/2ML IJ SOLN
INTRAMUSCULAR | Status: DC | PRN
  Administered 2021-01-30: 25 ug via INTRAVENOUS

## 2021-01-30 MED ORDER — SODIUM CHLORIDE 0.9 % WEIGHT BASED INFUSION
3.0000 mL/kg/h | INTRAVENOUS | Status: AC
  Administered 2021-01-30: 3 mL/kg/h via INTRAVENOUS

## 2021-01-30 MED ORDER — HEPARIN (PORCINE) IN NACL 1000-0.9 UT/500ML-% IV SOLN
INTRAVENOUS | Status: AC
  Filled 2021-01-30: qty 1000

## 2021-01-30 MED ORDER — SODIUM CHLORIDE 0.9 % WEIGHT BASED INFUSION: 1.0000 mL/kg/h | INTRAVENOUS | Status: DC

## 2021-01-30 MED ORDER — ASPIRIN 81 MG PO CHEW: 81.0000 mg | CHEWABLE_TABLET | ORAL | Status: DC

## 2021-01-30 MED ORDER — SODIUM CHLORIDE 0.9% FLUSH: 3.0000 mL | Freq: Two times a day (BID) | INTRAVENOUS | Status: DC

## 2021-01-30 MED ORDER — IOHEXOL 300 MG/ML  SOLN
INTRAMUSCULAR | Status: DC | PRN
  Administered 2021-01-30: 121 mL

## 2021-01-30 SURGICAL SUPPLY — 11 items
CATH INFINITI 5 FR JL3.5 (CATHETERS) ×2 IMPLANT
CATH INFINITI JR4 5F (CATHETERS) ×2 IMPLANT
DEVICE RAD TR BAND REGULAR (VASCULAR PRODUCTS) ×2 IMPLANT
GLIDESHEATH SLEND SS 6F .021 (SHEATH) ×2 IMPLANT
GUIDEWIRE INQWIRE 1.5J.035X260 (WIRE) ×1 IMPLANT
INQWIRE 1.5J .035X260CM (WIRE) ×2
KIT SYRINGE INJ CVI SPIKEX1 (MISCELLANEOUS) ×2 IMPLANT
PACK CARDIAC CATH (CUSTOM PROCEDURE TRAY) ×2 IMPLANT
PROTECTION STATION PRESSURIZED (MISCELLANEOUS) ×2
SET ATX SIMPLICITY (MISCELLANEOUS) ×2 IMPLANT
STATION PROTECTION PRESSURIZED (MISCELLANEOUS) ×1 IMPLANT

## 2021-01-30 NOTE — Telephone Encounter (Signed)
Called patient to set up CT follow up appointment with Dr. Lucky Cowboy.  Patient is currently admitted into hospital per patients chart.

## 2021-01-30 NOTE — H&P (Signed)
Precardiac cath evaluation Patient was seen and evaluated for aortic stenosis and set up for cardiac cath Patient presents today for the procedure for evaluation of coronary disease as well as aortic stenosis Patient denies any worsening symptoms she has had one episode of syncope around Christmas but has had persistent dyspnea presumed related to either coronary disease or aortic stenosis Patient now being evaluated for possible TAVR Physical exam essentially unchanged  Neck exam bilateral bruits right greater than left Lung exam is clear Heart exam harsh 3/6 systolic ejection murmur along the left sternal border Abdominal exam benign Extremity exam 2+ pulses throughout  Impression Severe aortic stenosis on echo Murmur Hypertension Dyspnea on exertion Chronic renal insufficiency stage III Diabetes Peripheral vascular disease  Plan Left heart cath for evaluation aortic stenosis outpatient Rule out coronary disease

## 2021-01-31 ENCOUNTER — Encounter: Payer: Self-pay | Admitting: Internal Medicine

## 2021-02-04 ENCOUNTER — Ambulatory Visit
Admission: RE | Admit: 2021-02-04 | Discharge: 2021-02-04 | Disposition: A | Discharge status: Home / Self Care | Payer: Medicare Other | Source: Ambulatory Visit | Attending: Vascular Surgery | Admitting: Vascular Surgery

## 2021-02-04 ENCOUNTER — Other Ambulatory Visit: Payer: Self-pay

## 2021-02-04 DIAGNOSIS — I6523 Occlusion and stenosis of bilateral carotid arteries: Secondary | ICD-10-CM | POA: Diagnosis not present

## 2021-02-04 MED ORDER — IOHEXOL 350 MG/ML SOLN
50.0000 mL | Freq: Once | INTRAVENOUS | Status: AC | PRN
  Administered 2021-02-04: 50 mL via INTRAVENOUS

## 2021-02-04 MED ORDER — IOHEXOL 350 MG/ML SOLN: 75.0000 mL | Freq: Once | INTRAVENOUS | Status: DC | PRN

## 2021-02-12 ENCOUNTER — Other Ambulatory Visit: Payer: Self-pay

## 2021-02-12 ENCOUNTER — Ambulatory Visit (INDEPENDENT_AMBULATORY_CARE_PROVIDER_SITE_OTHER): Payer: Medicare Other | Admitting: Vascular Surgery

## 2021-02-12 ENCOUNTER — Encounter (INDEPENDENT_AMBULATORY_CARE_PROVIDER_SITE_OTHER): Payer: Self-pay | Admitting: Vascular Surgery

## 2021-02-12 VITALS — BP 160/63 | HR 66 | Ht 67.0 in | Wt 181.0 lb

## 2021-02-12 DIAGNOSIS — E119 Type 2 diabetes mellitus without complications: Secondary | ICD-10-CM

## 2021-02-12 DIAGNOSIS — E785 Hyperlipidemia, unspecified: Secondary | ICD-10-CM

## 2021-02-12 DIAGNOSIS — N1832 Chronic kidney disease, stage 3b: Secondary | ICD-10-CM

## 2021-02-12 DIAGNOSIS — I10 Essential (primary) hypertension: Secondary | ICD-10-CM | POA: Diagnosis not present

## 2021-02-12 DIAGNOSIS — I6523 Occlusion and stenosis of bilateral carotid arteries: Secondary | ICD-10-CM | POA: Diagnosis not present

## 2021-02-12 MED ORDER — CLOPIDOGREL BISULFATE 75 MG PO TABS: 75.0000 mg | ORAL_TABLET | Freq: Every day | ORAL | 6 refills | Status: DC

## 2021-02-12 NOTE — Patient Instructions (Signed)
Carotid Angioplasty With Stent, Care After This sheet gives you information about how to care for yourself after your procedure. Your doctor may also give you more specific instructions. If you have problems or questions, contact your doctor. What can I expect after the procedure? After the procedure, it is common to have:  Bruising at the site where the tube (catheter) was inserted. This usually goes away within 1-2 weeks.  A small amount of blood or clear fluid coming from your cut from surgery (incision).  A small amount of blood collecting under your skin (hematoma) around the tube site. This may form a lump that can be sore and tender. This usually lasts for 1-2 weeks. Follow these instructions at home: Medicines  Take over-the-counter and prescription medicines only as told by your doctor.  Your doctor may prescribe a medicine to thin your blood after the procedure. This will help to prevent blood clots.  If you were prescribed an antibiotic medicine, take it as told by your doctor. Do not stop taking the antibiotic even if you start to feel better.  Ask your doctor if the medicine prescribed to you requires you to avoid driving or using heavy machinery. Incision care  Keep your cut from surgery clean and dry.  Follow instructions from your doctor about how to take care of your cut from surgery. Make sure you: ? Wash your hands with soap and water before and after you change your bandage (dressing). If you cannot use soap and water, use hand sanitizer. ? Change your bandage as told by your doctor. ? Do not rub the site. This may cause bleeding. ? Leave stitches (sutures), skin glue, or skin tape (adhesive) strips in place. They may need to stay in place for 2 weeks or longer. If tape strips get loose and curl up, you may trim the loose edges. Do not remove tape strips completely unless your doctor says it is okay.  Check the area around your cut every day for signs of infection.  Check for: ? More redness, swelling, or pain. ? More fluid or blood. ? Warmth. ? Pus or a bad smell.   Activity  Return to your normal activities as told by your doctor. Ask your doctor what activities are safe for you.  Do not lift anything that is heavier than 10 lb (4.5 kg), or the limit that you are told, until your doctor says that it is safe.  Avoid sex until your doctor says that this is safe for you. Eating and drinking  Follow instructions from your doctor about what you cannot eat or drink.  Drink enough fluid to keep your pee (urine) pale yellow.  Eat a heart-healthy diet. This includes foods like fresh fruits and vegetables, whole grains, low-fat dairy products, and low-fat (lean) meats. Avoid foods that are: ? High in salt, saturated fat, or sugar. ? Canned or highly processed. ? Fried.   Lifestyle  If you drink alcohol: ? Limit how much you use to:  0-1 drink a day for women.  0-2 drinks a day for men. ? Be aware of how much alcohol is in your drink. In the [place] one drink equals one 12 oz bottle of beer (355 mL), one 5 oz glass of wine (148 mL), or one 1 oz glass of hard liquor (44 mL).  Do not use any products that contain nicotine or tobacco, such as cigarettes, e-cigarettes, and chewing tobacco. If you need help quitting, ask your doctor.  Work with  your doctor to keep your blood pressure under control.  Stay at a healthy weight. General instructions  Do not take baths, swim, or use a hot tub until your doctor approves.  Avoid straining to poop to keep the cut from bleeding.  Tell all doctors who provide care for you that you have a stent.  Keep all follow-up visits as told by your doctor. This is important. Contact a doctor if:  You have more redness, swelling, or pain around your cut.  You have more fluid or blood coming from your cut.  The area around your cut feels warm to the touch.  You have pus or a bad smell coming from your cut.  You  have a lump caused by bleeding under your skin, and the lump does not go away after 2 weeks.  You have a fever. Get help right away if:  You have a hard time breathing.  You have pain in your chest.  You have any signs of a stroke. You may be at risk for a stroke even after this procedure. You may be at greater risk of a stroke if you have diabetes, lung disease, kidney disease, had a previous stroke, or are 45 years of age or older. "BE FAST" is an easy way to remember the main warning signs: ? B - Balance. Signs are dizziness, sudden trouble walking, or loss of balance. ? E - Eyes. Signs are trouble seeing or a change in how you see. ? F - Face. Signs are sudden weakness or loss of feeling of the face, or the face or eyelid drooping on one side. ? A - Arms. Signs are weakness or loss of feeling in an arm. This happens suddenly and usually on one side of the body. ? S - Speech. Signs are sudden trouble speaking, slurred speech, or trouble understanding what people say. ? T - Time. Time to call emergency services. Write down what time symptoms started.  You have other signs of a stroke, such as: ? A sudden, very bad headache with no known cause. ? Feeling sick to your stomach (nausea). ? Vomiting. ? Jerky movements you cannot control (seizure).  You notice a lump caused by bleeding under your skin and the lump is quickly getting larger.  You suddenly get pain in the area where your stent was placed.  Your cut from surgery starts to bleed and does not stop after you hold pressure on it for a few minutes. These symptoms may be an emergency. Do not wait to see if the symptoms will go away. Get medical help right away. Call your local emergency services (911 in the [place] Do not drive yourself to the hospital. Summary  After the procedure, it is common to have bruising or a small amount of blood or fluid in the area where the surgery cut was made.  Take over-the-counter and prescription  medicines only as told by your doctor.  Return to your normal activities as told by your doctor. Ask your doctor what activities are safe for you.  Make sure you know which symptoms to watch for and when to get medical help right away. This information is not intended to replace advice given to you by your health care provider. Make sure you discuss any questions you have with your health care provider. Document Revised: 12/27/2018 Document Reviewed: 09/30/2018 Elsevier Patient Education  2021 [employer].

## 2021-02-12 NOTE — Progress Notes (Signed)
MRN : [id number]  Francisco Gibbs is a 87 y.o. (09/19/1933) male who presents with chief complaint of  Chief Complaint  Patient presents with  . Follow-up    CT results  .  History of Present Illness: Patient returns today in follow up of his carotid disease after a CT angiogram.  He says he felt weak and sick yesterday and has had a few episodes of feeling poorly since his initial visit. I have independently reviewed his CT angiogram.  The official report was a 65 to 70% right ICA stenosis and a 60 to 65% left ICA stenosis, but the caveat was given even on the official report that the estimation was difficult due to shadowing and artifact from the plaque.  Both sides look worse to me.  The left side actually looks a little worse than the right side to my review, and the lesion on the left side is clearly a very high lesion that would not be amenable to surgical therapy.  The lesion goes up to C2.  Current Outpatient Medications  Medication Sig Dispense Refill  . albuterol (PROVENTIL HFA;VENTOLIN HFA) 108 (90 Base) MCG/ACT inhaler Inhale 2 puffs into the lungs every 4 (four) hours as needed for wheezing or shortness of breath.    Marland Kitchen amLODipine (NORVASC) 10 MG tablet Take 10 mg by mouth daily.    Marland Kitchen aspirin 81 MG chewable tablet Chew 81 mg by mouth daily.     Marland Kitchen atorvastatin (LIPITOR) 80 MG tablet Take 80 mg by mouth every evening.   1  . cetirizine (ZYRTEC) 10 MG tablet Take 10 mg by mouth every evening.   1  . clopidogrel (PLAVIX) 75 MG tablet Take 1 tablet (75 mg total) by mouth daily. 30 tablet 6  . fluticasone (FLONASE) 50 MCG/ACT nasal spray Place 2 sprays into the nose every evening.     . Fluticasone-Umeclidin-Vilant (TRELEGY ELLIPTA) 100-62.5-25 MCG/INH AEPB Inhale 1 puff into the lungs daily.    . furosemide (LASIX) 20 MG tablet Take 20 mg by mouth daily as needed for fluid.    Marland Kitchen gabapentin (NEURONTIN) 300 MG capsule Take 300 mg by mouth 3 (three) times daily.     . hydrochlorothiazide  (HYDRODIURIL) 25 MG tablet Take 25 mg by mouth daily.    Marland Kitchen ipratropium-albuterol (DUONEB) 0.5-2.5 (3) MG/3ML SOLN Take 3 mLs by nebulization every 6 (six) hours as needed. (Patient taking differently: Take 3 mLs by nebulization in the morning and at bedtime.) 360 mL 0  . magnesium oxide (MAG-OX) 400 (241.3 Mg) MG tablet Take 1 tablet (400 mg total) by mouth daily. 5 tablet 0  . metFORMIN (GLUCOPHAGE-XR) 500 MG 24 hr tablet Take 1,000 mg by mouth 2 (two) times daily.    . Multiple Vitamins-Minerals (MULTIVITAMIN WITH MINERALS) tablet Take 1 tablet by mouth daily.    Marland Kitchen omeprazole (PRILOSEC) 40 MG capsule Take 40 mg by mouth every evening.  1  . quinapril (ACCUPRIL) 40 MG tablet Take 40 mg by mouth daily.   1  . sertraline (ZOLOFT) 25 MG tablet Take 50 mg by mouth in the morning and at bedtime.    . sitaGLIPtin (JANUVIA) 50 MG tablet Take 50 mg by mouth daily.    Marland Kitchen terazosin (HYTRIN) 2 MG capsule Take 2 mg by mouth at bedtime.    . Vitamin D, Ergocalciferol, (DRISDOL) 50000 units CAPS capsule Take 50,000 Units by mouth every Saturday.   3  . VOLTAREN 1 % GEL Apply  a  small amount to affected area four times a day as needed     No current facility-administered medications for this visit.    Past Medical History:  Diagnosis Date  . Aortic stenosis   . Basal cell carcinoma 03/16/2020   R tip of nose, R preauricular area  . COPD (chronic obstructive pulmonary disease) ([place])   . Diabetes mellitus without complication ([place])    Pt takes Metformin.  Marland Kitchen GERD (gastroesophageal reflux disease)   . HLD (hyperlipidemia)   . Hypertension     Past Surgical History:  Procedure Laterality Date  . HYDROCELE EXCISION / REPAIR    . INGUINAL HERNIA REPAIR    . KNEE SURGERY    . LEFT HEART CATH AND CORONARY ANGIOGRAPHY Left 01/30/2021   Procedure: LEFT HEART CATH AND CORONARY ANGIOGRAPHY;  Surgeon: Yolonda Kida, MD;  Location: [facility];  Service: Cardiovascular;  Laterality: Left;      Social History   Tobacco Use  . Smoking status: Former Smoker    Packs/day: 1.00    Years: 70.00    Pack years: 70.00    Types: Cigarettes  . Smokeless tobacco: Never Used  Substance Use Topics  . Alcohol use: Not Currently  . Drug use: Never     Family History  Problem Relation Age of Onset  . Diabetes Sister   . Diabetes Paternal Aunt   no bleeding or clotting disorders  Allergies  Allergen Reactions  . Guaifenesin Shortness Of Breath    Other reaction(s): Other (See Comments)  . Penicillin G Rash and Shortness Of Breath    Has patient had a PCN reaction causing immediate rash, facial/tongue/throat swelling, SOB or lightheadedness with hypotension: Yes Has patient had a PCN reaction causing severe rash involving mucus membranes or skin necrosis: No Has patient had a PCN reaction that required hospitalization: No Has patient had a PCN reaction occurring within the last 10 years: No If all of the above answers are "NO", then may proceed with Cephalosporin use. Has patient had a PCN reaction causing immediate rash, facial/tongue/throat swelling, SOB or lightheadedness with hypotension: Yes Has patient had a PCN reaction causing severe rash involving mucus membranes or skin necrosis: No Has patient had a PCN reaction that required hospitalization: No Has patient had a PCN reaction occurring within the last 10 years: No If all of the above answers are "NO", then may proceed with Cephalosporin use. Has patient had a PCN reaction causing immediate rash, facial/tongue/throat swelling, SOB or lightheadedness with hypotension: Yes Has patient had a PCN reaction causing severe rash involving mucus membranes or skin necrosis: No Has patient had a PCN reaction that required hospitalization: No Has patient had a PCN reaction occurring within the last 10 years: No If all of the above answers are "NO", then may proceed with Cephalosporin use. Has patient had a PCN reaction causing  immediate rash, facial/tongue/throat swelling, SOB or lightheadedness with hypotension: Yes Has patient had a PCN reaction causing severe rash involving mucus membranes or skin necrosis: No Has patient had a PCN reaction that required hospitalization: No Has patient had a PCN reaction occurring within the last 10 years: NoIf all of the above answers are "NO", then may proceed with Cephalosporin use. Has patient had a PCN reaction causing immediate rash, facial/tongue/throat swelling, SOB or lightheadedness with hypotension: Yes Has patient had a PCN reaction causing se... (TRUNCATED)  . Sulfa Antibiotics Shortness Of Breath     REVIEW OF SYSTEMS (Negative unless checked)  Constitutional: [] ?Weight loss  [] ?Fever  [] ?Chills Cardiac: [] ?Chest pain   [] ?Chest pressure   [] ?Palpitations   [] ?Shortness of breath when laying flat   [] ?Shortness of breath at rest   [] ?Shortness of breath with exertion. Vascular:  [] ?Pain in legs with walking   [] ?Pain in legs at rest   [] ?Pain in legs when laying flat   [] ?Claudication   [] ?Pain in feet when walking  [] ?Pain in feet at rest  [] ?Pain in feet when laying flat   [] ?History of DVT   [] ?Phlebitis   [] ?Swelling in legs   [] ?Varicose veins   [] ?Non-healing ulcers Pulmonary:   [] ?Uses home oxygen   [] ?Productive cough   [] ?Hemoptysis   [] ?Wheeze  [x] ?COPD   [] ?Asthma Neurologic:  [] ?Dizziness  [] ?Blackouts   [] ?Seizures   [] ?History of stroke   [] ?History of TIA  [] ?Aphasia   [] ?Temporary blindness   [] ?Dysphagia   [] ?Weakness or numbness in arms   [] ?Weakness or numbness in legs Musculoskeletal:  [x] ?Arthritis   [] ?Joint swelling   [x] ?Joint pain   [] ?Low back pain Hematologic:  [] ?Easy bruising  [] ?Easy bleeding   [] ?Hypercoagulable state   [] ?Anemic  [] ?Hepatitis Gastrointestinal:  [] ?Blood in stool   [] ?Vomiting blood  [x] ?Gastroesophageal reflux/heartburn   [] ?Abdominal pain Genitourinary:  [] ?Chronic kidney disease   [] ?Difficult urination  [] ?Frequent  urination  [] ?Burning with urination   [] ?Hematuria Skin:  [] ?Rashes   [] ?Ulcers   [] ?Wounds Psychological:  [] ?History of anxiety   [] ? History of major depression.  Physical Examination  BP (!) 160/63   Pulse 66   Ht 5\' 7"  (1.702 m)   Wt 181 lb (82.1 kg)   BMI 28.35 kg/m  Gen:  WD/WN, NAD.  Appears younger than stated age Head: [place]/AT, No temporalis wasting. Ear/Nose/Throat: Hearing diminished, nares w/o erythema or drainage Eyes: Conjunctiva clear. Sclera non-icteric Neck: Supple.  Trachea midline Pulmonary:  Good air movement, no use of accessory muscles.  Cardiac: Somewhat irregular Vascular:  Vessel Right Left  Radial Palpable Palpable                   Musculoskeletal: M/S 5/5 throughout.  No deformity or atrophy.  No edema. Neurologic: Sensation grossly intact in extremities.  Symmetrical.  Speech is fluent.  Psychiatric: Judgment intact, Mood & affect appropriate for pt's clinical situation. Dermatologic: No rashes or ulcers noted.  No cellulitis or open wounds.       Labs Recent Results (from the past 2160 hour(s))  Brain natriuretic peptide     Status: Abnormal   Collection Time: 01/18/21 11:00 AM  Result Value Ref Range   B Natriuretic Peptide 817.8 (H) 0.0 - 100.0 pg/mL    Comment: Performed at [facility], [address] [place], [place] [address]  SARS CORONAVIRUS 2 (TAT 6-24 HRS) Nasopharyngeal Nasopharyngeal Swab     Status: None   Collection Time: 01/28/21 11:37 AM   Specimen: Nasopharyngeal Swab  Result Value Ref Range   SARS Coronavirus 2 NEGATIVE NEGATIVE    Comment: (NOTE) SARS-CoV-2 target nucleic acids are NOT DETECTED.  The SARS-CoV-2 RNA is generally detectable in upper and lower respiratory specimens during the acute phase of infection. Negative results do not preclude SARS-CoV-2 infection, do not rule out co-infections with other pathogens, and should not be used as the sole basis for treatment or other patient management  decisions. Negative results must be combined with clinical observations, patient history, and epidemiological information. The expected result is Negative.  Fact Sheet for Patients: SugarRoll.be  Fact Sheet for Healthcare Providers: https://www.woods-mathews.com/  This test is not yet approved or cleared by the [place] FDA and  has been authorized for detection and/or diagnosis of SARS-CoV-2 by FDA under an Emergency Use Authorization (EUA). This EUA will remain  in effect (meaning this test can be used) for the duration of the COVID-19 declaration under Se ction 564(b)(1) of the Act, 21 U.S.C. section 360bbb-3(b)(1), unless the authorization is terminated or revoked sooner.  Performed at [facility], [place] [address] [place], [place] [address]     Radiology CT Angio Neck W/Cm &/Or Wo/Cm  Result Date: 02/04/2021 CLINICAL DATA:  Asymptomatic carotid stenosis EXAM: CT ANGIOGRAPHY NECK TECHNIQUE: Multidetector CT imaging of the neck was performed using the standard protocol during bolus administration of intravenous contrast. Multiplanar CT image reconstructions and MIPs were obtained to evaluate the vascular anatomy. Carotid stenosis measurements (when applicable) are obtained utilizing NASCET criteria, using the distal internal carotid diameter as the denominator. CONTRAST:  30mL OMNIPAQUE IOHEXOL 350 MG/ML SOLN COMPARISON:  None. FINDINGS: Aortic arch: Mixed plaque along the arch and at the patent great vessel origins. Right carotid system: Patent. Minimal calcified plaque along the common carotid. Calcified plaque at the ICA origin without stenosis. Subsequent mixed plaque causing approximately 65-70% stenosis. Minimal luminal measurement difficult due to presence of artifact. Left carotid system: Patent. Mixed plaque at the ICA origin without stenosis. Subsequent large calcified plaque causing approximately 60-65% stenosis. Minimal  luminal measurement difficult due to presence of artifact. Vertebral arteries: Patent and codominant. Skeleton: Degenerative changes of the cervical spine. Canal narrowing is greatest at C3-C4. Other neck: Subcentimeter calcified right thyroid nodule previously evaluated by ultrasound. Upper chest: Mild centrilobular emphysema. IMPRESSION: Mixed plaque along the proximal right ICA causing approximately 65-70% stenosis. Calcified plaque along the proximal left ICA causing approximately 60-65% stenosis. Stenosis may be underestimated or overestimated due to difficulty in minimal luminal measurement secondary to artifact. Electronically Signed   By: Macy Mis M.D.   On: 02/04/2021 11:43   CARDIAC CATHETERIZATION  Result Date: 01/30/2021  Prox RCA lesion is 25% stenosed.  Prox LAD lesion is 50% stenosed.  Preserved left ventricular function EF around 50 to 55%  No significant pressure gradient across the aortic valve  Conclusion Diagnostic cardiac cath right radial approach Mild to moderate coronary disease No significant obstructive lesion requiring intervention Normal left ventricular function EF around 50 to 55% No evidence of significant aortic stenosis Intervention deferred    Assessment/Plan Essential hypertension blood pressure control important in reducing the progression of atherosclerotic disease. On appropriate oral medications.   Stage 3b chronic kidney disease ([place]) Limit contrast with his procedures  Hyperlipidemia lipid control important in reducing the progression of atherosclerotic disease. Continue statin therapy  Diabetes mellitus, type II ([place]) blood glucose control important in reducing the progression of atherosclerotic disease. Also, involved in wound healing. On appropriate medications.   Carotid stenosis I have independently reviewed his CT angiogram.  The official report was a 65 to 70% right ICA stenosis and a 60 to 65% left ICA stenosis, but the caveat was  given even on the official report that the estimation was difficult due to shadowing and artifact from the plaque.  Both sides look worse to me.  The left side actually looks a little worse than the right side to my review, and the lesion on the left side is clearly a very high lesion that would not be amenable to surgical therapy.  The lesion goes  up to C2.  I think an angiogram with planned stenting if indeed a high-grade stenosis is seen on the left would be appropriate.  We discussed with he and his family member today that we would have to wait at least 6 weeks before considering revascularization on the right side.  That one may be approachable through surgery, but let us see how he does from the left carotid angiogram and potential stent before making that decision.  He and his family voiced their understanding and are agreeable to proceed.  A prescription for Plavix will be sent in prior to his carotid angiogram and potential stent.    Leotis Pain, MD  02/12/2021 12:06 PM    This note was created with Dragon medical transcription system.  Any errors from dictation are purely unintentional

## 2021-02-12 NOTE — H&P (View-Only) (Signed)
MRN : [id number]  Francisco Gibbs is a 87 y.o. (July 17, 1933) male who presents with chief complaint of  Chief Complaint  Patient presents with  . Follow-up    CT results  .  History of Present Illness: Patient returns today in follow up of his carotid disease after a CT angiogram.  He says he felt weak and sick yesterday and has had a few episodes of feeling poorly since his initial visit. I have independently reviewed his CT angiogram.  The official report was a 65 to 70% right ICA stenosis and a 60 to 65% left ICA stenosis, but the caveat was given even on the official report that the estimation was difficult due to shadowing and artifact from the plaque.  Both sides look worse to me.  The left side actually looks a little worse than the right side to my review, and the lesion on the left side is clearly a very high lesion that would not be amenable to surgical therapy.  The lesion goes up to C2.  Current Outpatient Medications  Medication Sig Dispense Refill  . albuterol (PROVENTIL HFA;VENTOLIN HFA) 108 (90 Base) MCG/ACT inhaler Inhale 2 puffs into the lungs every 4 (four) hours as needed for wheezing or shortness of breath.    Marland Kitchen amLODipine (NORVASC) 10 MG tablet Take 10 mg by mouth daily.    Marland Kitchen aspirin 81 MG chewable tablet Chew 81 mg by mouth daily.     Marland Kitchen atorvastatin (LIPITOR) 80 MG tablet Take 80 mg by mouth every evening.   1  . cetirizine (ZYRTEC) 10 MG tablet Take 10 mg by mouth every evening.   1  . clopidogrel (PLAVIX) 75 MG tablet Take 1 tablet (75 mg total) by mouth daily. 30 tablet 6  . fluticasone (FLONASE) 50 MCG/ACT nasal spray Place 2 sprays into the nose every evening.     . Fluticasone-Umeclidin-Vilant (TRELEGY ELLIPTA) 100-62.5-25 MCG/INH AEPB Inhale 1 puff into the lungs daily.    . furosemide (LASIX) 20 MG tablet Take 20 mg by mouth daily as needed for fluid.    Marland Kitchen gabapentin (NEURONTIN) 300 MG capsule Take 300 mg by mouth 3 (three) times daily.     . hydrochlorothiazide  (HYDRODIURIL) 25 MG tablet Take 25 mg by mouth daily.    Marland Kitchen ipratropium-albuterol (DUONEB) 0.5-2.5 (3) MG/3ML SOLN Take 3 mLs by nebulization every 6 (six) hours as needed. (Patient taking differently: Take 3 mLs by nebulization in the morning and at bedtime.) 360 mL 0  . magnesium oxide (MAG-OX) 400 (241.3 Mg) MG tablet Take 1 tablet (400 mg total) by mouth daily. 5 tablet 0  . metFORMIN (GLUCOPHAGE-XR) 500 MG 24 hr tablet Take 1,000 mg by mouth 2 (two) times daily.    . Multiple Vitamins-Minerals (MULTIVITAMIN WITH MINERALS) tablet Take 1 tablet by mouth daily.    Marland Kitchen omeprazole (PRILOSEC) 40 MG capsule Take 40 mg by mouth every evening.  1  . quinapril (ACCUPRIL) 40 MG tablet Take 40 mg by mouth daily.   1  . sertraline (ZOLOFT) 25 MG tablet Take 50 mg by mouth in the morning and at bedtime.    . sitaGLIPtin (JANUVIA) 50 MG tablet Take 50 mg by mouth daily.    Marland Kitchen terazosin (HYTRIN) 2 MG capsule Take 2 mg by mouth at bedtime.    . Vitamin D, Ergocalciferol, (DRISDOL) 50000 units CAPS capsule Take 50,000 Units by mouth every Saturday.   3  . VOLTAREN 1 % GEL Apply  a  small amount to affected area four times a day as needed     No current facility-administered medications for this visit.    Past Medical History:  Diagnosis Date  . Aortic stenosis   . Basal cell carcinoma 03/16/2020   R tip of nose, R preauricular area  . COPD (chronic obstructive pulmonary disease) ([place])   . Diabetes mellitus without complication ([place])    Pt takes Metformin.  Marland Kitchen GERD (gastroesophageal reflux disease)   . HLD (hyperlipidemia)   . Hypertension     Past Surgical History:  Procedure Laterality Date  . HYDROCELE EXCISION / REPAIR    . INGUINAL HERNIA REPAIR    . KNEE SURGERY    . LEFT HEART CATH AND CORONARY ANGIOGRAPHY Left 01/30/2021   Procedure: LEFT HEART CATH AND CORONARY ANGIOGRAPHY;  Surgeon: Yolonda Kida, MD;  Location: [facility];  Service: Cardiovascular;  Laterality: Left;      Social History   Tobacco Use  . Smoking status: Former Smoker    Packs/day: 1.00    Years: 70.00    Pack years: 70.00    Types: Cigarettes  . Smokeless tobacco: Never Used  Substance Use Topics  . Alcohol use: Not Currently  . Drug use: Never     Family History  Problem Relation Age of Onset  . Diabetes Sister   . Diabetes Paternal Aunt   no bleeding or clotting disorders  Allergies  Allergen Reactions  . Guaifenesin Shortness Of Breath    Other reaction(s): Other (See Comments)  . Penicillin G Rash and Shortness Of Breath    Has patient had a PCN reaction causing immediate rash, facial/tongue/throat swelling, SOB or lightheadedness with hypotension: Yes Has patient had a PCN reaction causing severe rash involving mucus membranes or skin necrosis: No Has patient had a PCN reaction that required hospitalization: No Has patient had a PCN reaction occurring within the last 10 years: No If all of the above answers are "NO", then may proceed with Cephalosporin use. Has patient had a PCN reaction causing immediate rash, facial/tongue/throat swelling, SOB or lightheadedness with hypotension: Yes Has patient had a PCN reaction causing severe rash involving mucus membranes or skin necrosis: No Has patient had a PCN reaction that required hospitalization: No Has patient had a PCN reaction occurring within the last 10 years: No If all of the above answers are "NO", then may proceed with Cephalosporin use. Has patient had a PCN reaction causing immediate rash, facial/tongue/throat swelling, SOB or lightheadedness with hypotension: Yes Has patient had a PCN reaction causing severe rash involving mucus membranes or skin necrosis: No Has patient had a PCN reaction that required hospitalization: No Has patient had a PCN reaction occurring within the last 10 years: No If all of the above answers are "NO", then may proceed with Cephalosporin use. Has patient had a PCN reaction causing  immediate rash, facial/tongue/throat swelling, SOB or lightheadedness with hypotension: Yes Has patient had a PCN reaction causing severe rash involving mucus membranes or skin necrosis: No Has patient had a PCN reaction that required hospitalization: No Has patient had a PCN reaction occurring within the last 10 years: NoIf all of the above answers are "NO", then may proceed with Cephalosporin use. Has patient had a PCN reaction causing immediate rash, facial/tongue/throat swelling, SOB or lightheadedness with hypotension: Yes Has patient had a PCN reaction causing se... (TRUNCATED)  . Sulfa Antibiotics Shortness Of Breath     REVIEW OF SYSTEMS (Negative unless checked)  Constitutional: [] ?Weight loss  [] ?Fever  [] ?Chills Cardiac: [] ?Chest pain   [] ?Chest pressure   [] ?Palpitations   [] ?Shortness of breath when laying flat   [] ?Shortness of breath at rest   [] ?Shortness of breath with exertion. Vascular:  [] ?Pain in legs with walking   [] ?Pain in legs at rest   [] ?Pain in legs when laying flat   [] ?Claudication   [] ?Pain in feet when walking  [] ?Pain in feet at rest  [] ?Pain in feet when laying flat   [] ?History of DVT   [] ?Phlebitis   [] ?Swelling in legs   [] ?Varicose veins   [] ?Non-healing ulcers Pulmonary:   [] ?Uses home oxygen   [] ?Productive cough   [] ?Hemoptysis   [] ?Wheeze  [x] ?COPD   [] ?Asthma Neurologic:  [] ?Dizziness  [] ?Blackouts   [] ?Seizures   [] ?History of stroke   [] ?History of TIA  [] ?Aphasia   [] ?Temporary blindness   [] ?Dysphagia   [] ?Weakness or numbness in arms   [] ?Weakness or numbness in legs Musculoskeletal:  [x] ?Arthritis   [] ?Joint swelling   [x] ?Joint pain   [] ?Low back pain Hematologic:  [] ?Easy bruising  [] ?Easy bleeding   [] ?Hypercoagulable state   [] ?Anemic  [] ?Hepatitis Gastrointestinal:  [] ?Blood in stool   [] ?Vomiting blood  [x] ?Gastroesophageal reflux/heartburn   [] ?Abdominal pain Genitourinary:  [] ?Chronic kidney disease   [] ?Difficult urination  [] ?Frequent  urination  [] ?Burning with urination   [] ?Hematuria Skin:  [] ?Rashes   [] ?Ulcers   [] ?Wounds Psychological:  [] ?History of anxiety   [] ? History of major depression.  Physical Examination  BP (!) 160/63   Pulse 66   Ht 5\' 7"  (1.702 m)   Wt 181 lb (82.1 kg)   BMI 28.35 kg/m  Gen:  WD/WN, NAD.  Appears younger than stated age Head: [place]/AT, No temporalis wasting. Ear/Nose/Throat: Hearing diminished, nares w/o erythema or drainage Eyes: Conjunctiva clear. Sclera non-icteric Neck: Supple.  Trachea midline Pulmonary:  Good air movement, no use of accessory muscles.  Cardiac: Somewhat irregular Vascular:  Vessel Right Left  Radial Palpable Palpable                   Musculoskeletal: M/S 5/5 throughout.  No deformity or atrophy.  No edema. Neurologic: Sensation grossly intact in extremities.  Symmetrical.  Speech is fluent.  Psychiatric: Judgment intact, Mood & affect appropriate for pt's clinical situation. Dermatologic: No rashes or ulcers noted.  No cellulitis or open wounds.       Labs Recent Results (from the past 2160 hour(s))  Brain natriuretic peptide     Status: Abnormal   Collection Time: 01/18/21 11:00 AM  Result Value Ref Range   B Natriuretic Peptide 817.8 (H) 0.0 - 100.0 pg/mL    Comment: Performed at [facility], [address] [place], [place] [address]  SARS CORONAVIRUS 2 (TAT 6-24 HRS) Nasopharyngeal Nasopharyngeal Swab     Status: None   Collection Time: 01/28/21 11:37 AM   Specimen: Nasopharyngeal Swab  Result Value Ref Range   SARS Coronavirus 2 NEGATIVE NEGATIVE    Comment: (NOTE) SARS-CoV-2 target nucleic acids are NOT DETECTED.  The SARS-CoV-2 RNA is generally detectable in upper and lower respiratory specimens during the acute phase of infection. Negative results do not preclude SARS-CoV-2 infection, do not rule out co-infections with other pathogens, and should not be used as the sole basis for treatment or other patient management  decisions. Negative results must be combined with clinical observations, patient history, and epidemiological information. The expected result is Negative.  Fact Sheet for Patients: SugarRoll.be  Fact Sheet for Healthcare Providers: https://www.woods-mathews.com/  This test is not yet approved or cleared by the [place] FDA and  has been authorized for detection and/or diagnosis of SARS-CoV-2 by FDA under an Emergency Use Authorization (EUA). This EUA will remain  in effect (meaning this test can be used) for the duration of the COVID-19 declaration under Se ction 564(b)(1) of the Act, 21 U.S.C. section 360bbb-3(b)(1), unless the authorization is terminated or revoked sooner.  Performed at [facility], [place] [address] [place], [place] [address]     Radiology CT Angio Neck W/Cm &/Or Wo/Cm  Result Date: 02/04/2021 CLINICAL DATA:  Asymptomatic carotid stenosis EXAM: CT ANGIOGRAPHY NECK TECHNIQUE: Multidetector CT imaging of the neck was performed using the standard protocol during bolus administration of intravenous contrast. Multiplanar CT image reconstructions and MIPs were obtained to evaluate the vascular anatomy. Carotid stenosis measurements (when applicable) are obtained utilizing NASCET criteria, using the distal internal carotid diameter as the denominator. CONTRAST:  75mL OMNIPAQUE IOHEXOL 350 MG/ML SOLN COMPARISON:  None. FINDINGS: Aortic arch: Mixed plaque along the arch and at the patent great vessel origins. Right carotid system: Patent. Minimal calcified plaque along the common carotid. Calcified plaque at the ICA origin without stenosis. Subsequent mixed plaque causing approximately 65-70% stenosis. Minimal luminal measurement difficult due to presence of artifact. Left carotid system: Patent. Mixed plaque at the ICA origin without stenosis. Subsequent large calcified plaque causing approximately 60-65% stenosis. Minimal  luminal measurement difficult due to presence of artifact. Vertebral arteries: Patent and codominant. Skeleton: Degenerative changes of the cervical spine. Canal narrowing is greatest at C3-C4. Other neck: Subcentimeter calcified right thyroid nodule previously evaluated by ultrasound. Upper chest: Mild centrilobular emphysema. IMPRESSION: Mixed plaque along the proximal right ICA causing approximately 65-70% stenosis. Calcified plaque along the proximal left ICA causing approximately 60-65% stenosis. Stenosis may be underestimated or overestimated due to difficulty in minimal luminal measurement secondary to artifact. Electronically Signed   By: Macy Mis M.D.   On: 02/04/2021 11:43   CARDIAC CATHETERIZATION  Result Date: 01/30/2021  Prox RCA lesion is 25% stenosed.  Prox LAD lesion is 50% stenosed.  Preserved left ventricular function EF around 50 to 55%  No significant pressure gradient across the aortic valve  Conclusion Diagnostic cardiac cath right radial approach Mild to moderate coronary disease No significant obstructive lesion requiring intervention Normal left ventricular function EF around 50 to 55% No evidence of significant aortic stenosis Intervention deferred    Assessment/Plan Essential hypertension blood pressure control important in reducing the progression of atherosclerotic disease. On appropriate oral medications.   Stage 3b chronic kidney disease ([place]) Limit contrast with his procedures  Hyperlipidemia lipid control important in reducing the progression of atherosclerotic disease. Continue statin therapy  Diabetes mellitus, type II ([place]) blood glucose control important in reducing the progression of atherosclerotic disease. Also, involved in wound healing. On appropriate medications.   Carotid stenosis I have independently reviewed his CT angiogram.  The official report was a 65 to 70% right ICA stenosis and a 60 to 65% left ICA stenosis, but the caveat was  given even on the official report that the estimation was difficult due to shadowing and artifact from the plaque.  Both sides look worse to me.  The left side actually looks a little worse than the right side to my review, and the lesion on the left side is clearly a very high lesion that would not be amenable to surgical therapy.  The lesion goes  up to C2.  I think an angiogram with planned stenting if indeed a high-grade stenosis is seen on the left would be appropriate.  We discussed with he and his family member today that we would have to wait at least 6 weeks before considering revascularization on the right side.  That one may be approachable through surgery, but let us see how he does from the left carotid angiogram and potential stent before making that decision.  He and his family voiced their understanding and are agreeable to proceed.  A prescription for Plavix will be sent in prior to his carotid angiogram and potential stent.    Leotis Pain, MD  02/12/2021 12:06 PM    This note was created with Dragon medical transcription system.  Any errors from dictation are purely unintentional

## 2021-02-12 NOTE — Assessment & Plan Note (Addendum)
I have independently reviewed his CT angiogram.  The official report was a 65 to 70% right ICA stenosis and a 60 to 65% left ICA stenosis, but the caveat was given even on the official report that the estimation was difficult due to shadowing and artifact from the plaque.  Both sides look worse to me.  The left side actually looks a little worse than the right side to my review, and the lesion on the left side is clearly a very high lesion that would not be amenable to surgical therapy.  The lesion goes up to C2.  I think an angiogram with planned stenting if indeed a high-grade stenosis is seen on the left would be appropriate.  We discussed with he and his family member today that we would have to wait at least 6 weeks before considering revascularization on the right side.  That one may be approachable through surgery, but let us see how he does from the left carotid angiogram and potential stent before making that decision.  He and his family voiced their understanding and are agreeable to proceed.  A prescription for Plavix will be sent in prior to his carotid angiogram and potential stent.

## 2021-02-12 NOTE — Assessment & Plan Note (Signed)
blood glucose control important in reducing the progression of atherosclerotic disease. Also, involved in wound healing. On appropriate medications.  

## 2021-02-19 ENCOUNTER — Telehealth (INDEPENDENT_AMBULATORY_CARE_PROVIDER_SITE_OTHER): Payer: Self-pay

## 2021-02-19 NOTE — Telephone Encounter (Signed)
I attempted to contact the patient and left a message for a return call. Francisco Gibbs returned the call and the patient is scheduled with Dr. Lucky Cowboy for a left carotid stent placement on 02/25/21 with a 6:45 am arrival time to the MM. Covid testing on 02/21/21 between 8-2 pm at the [facility]. Pre-procedure instructions were discussed and will be mailed.

## 2021-02-21 ENCOUNTER — Other Ambulatory Visit: Payer: Self-pay

## 2021-02-21 ENCOUNTER — Other Ambulatory Visit
Admission: RE | Admit: 2021-02-21 | Discharge: 2021-02-21 | Disposition: A | Discharge status: Home / Self Care | Payer: Medicare Other | Source: Ambulatory Visit | Attending: Vascular Surgery | Admitting: Vascular Surgery

## 2021-02-21 DIAGNOSIS — Z20822 Contact with and (suspected) exposure to covid-19: Secondary | ICD-10-CM | POA: Diagnosis not present

## 2021-02-21 DIAGNOSIS — Z01812 Encounter for preprocedural laboratory examination: Secondary | ICD-10-CM | POA: Diagnosis present

## 2021-02-21 LAB — SARS CORONAVIRUS 2 (TAT 6-24 HRS): SARS Coronavirus 2: NEGATIVE

## 2021-02-24 ENCOUNTER — Other Ambulatory Visit (INDEPENDENT_AMBULATORY_CARE_PROVIDER_SITE_OTHER): Payer: Self-pay | Admitting: Nurse Practitioner

## 2021-02-25 ENCOUNTER — Encounter: Payer: Self-pay | Admitting: Vascular Surgery

## 2021-02-25 ENCOUNTER — Encounter
Admission: RE | Disposition: A | Discharge status: Home / Self Care | Payer: Self-pay | Source: Home / Self Care | Attending: Vascular Surgery

## 2021-02-25 ENCOUNTER — Inpatient Hospital Stay
Admission: RE | Admit: 2021-02-25 | Discharge: 2021-02-26 | DRG: 036 | Disposition: A | Discharge status: Home / Self Care | Payer: Medicare Other | Attending: Vascular Surgery | Admitting: Vascular Surgery

## 2021-02-25 ENCOUNTER — Other Ambulatory Visit: Payer: Self-pay

## 2021-02-25 DIAGNOSIS — Z85828 Personal history of other malignant neoplasm of skin: Secondary | ICD-10-CM | POA: Diagnosis not present

## 2021-02-25 DIAGNOSIS — I6522 Occlusion and stenosis of left carotid artery: Principal | ICD-10-CM

## 2021-02-25 DIAGNOSIS — I129 Hypertensive chronic kidney disease with stage 1 through stage 4 chronic kidney disease, or unspecified chronic kidney disease: Secondary | ICD-10-CM | POA: Diagnosis present

## 2021-02-25 DIAGNOSIS — Z880 Allergy status to penicillin: Secondary | ICD-10-CM | POA: Diagnosis not present

## 2021-02-25 DIAGNOSIS — E785 Hyperlipidemia, unspecified: Secondary | ICD-10-CM | POA: Diagnosis present

## 2021-02-25 DIAGNOSIS — R001 Bradycardia, unspecified: Secondary | ICD-10-CM | POA: Diagnosis not present

## 2021-02-25 DIAGNOSIS — Z20822 Contact with and (suspected) exposure to covid-19: Secondary | ICD-10-CM | POA: Diagnosis present

## 2021-02-25 DIAGNOSIS — I350 Nonrheumatic aortic (valve) stenosis: Secondary | ICD-10-CM | POA: Diagnosis present

## 2021-02-25 DIAGNOSIS — E1122 Type 2 diabetes mellitus with diabetic chronic kidney disease: Secondary | ICD-10-CM | POA: Diagnosis present

## 2021-02-25 DIAGNOSIS — R0902 Hypoxemia: Secondary | ICD-10-CM | POA: Diagnosis not present

## 2021-02-25 DIAGNOSIS — H9190 Unspecified hearing loss, unspecified ear: Secondary | ICD-10-CM | POA: Diagnosis present

## 2021-02-25 DIAGNOSIS — Z79899 Other long term (current) drug therapy: Secondary | ICD-10-CM

## 2021-02-25 DIAGNOSIS — N1832 Chronic kidney disease, stage 3b: Secondary | ICD-10-CM | POA: Diagnosis present

## 2021-02-25 DIAGNOSIS — I6523 Occlusion and stenosis of bilateral carotid arteries: Principal | ICD-10-CM | POA: Diagnosis present

## 2021-02-25 DIAGNOSIS — Z882 Allergy status to sulfonamides status: Secondary | ICD-10-CM | POA: Diagnosis not present

## 2021-02-25 DIAGNOSIS — J449 Chronic obstructive pulmonary disease, unspecified: Secondary | ICD-10-CM | POA: Diagnosis present

## 2021-02-25 DIAGNOSIS — Z7982 Long term (current) use of aspirin: Secondary | ICD-10-CM

## 2021-02-25 DIAGNOSIS — K219 Gastro-esophageal reflux disease without esophagitis: Secondary | ICD-10-CM | POA: Diagnosis present

## 2021-02-25 DIAGNOSIS — Z87891 Personal history of nicotine dependence: Secondary | ICD-10-CM

## 2021-02-25 DIAGNOSIS — Z833 Family history of diabetes mellitus: Secondary | ICD-10-CM | POA: Diagnosis not present

## 2021-02-25 DIAGNOSIS — Z7984 Long term (current) use of oral hypoglycemic drugs: Secondary | ICD-10-CM

## 2021-02-25 DIAGNOSIS — R0602 Shortness of breath: Secondary | ICD-10-CM

## 2021-02-25 DIAGNOSIS — Z7902 Long term (current) use of antithrombotics/antiplatelets: Secondary | ICD-10-CM

## 2021-02-25 DIAGNOSIS — Z888 Allergy status to other drugs, medicaments and biological substances status: Secondary | ICD-10-CM | POA: Diagnosis not present

## 2021-02-25 HISTORY — PX: CAROTID PTA/STENT INTERVENTION: CATH118231

## 2021-02-25 LAB — GLUCOSE, CAPILLARY
Glucose-Capillary: 110 mg/dL — ABNORMAL HIGH (ref 70–99)
Glucose-Capillary: 118 mg/dL — ABNORMAL HIGH (ref 70–99)
Glucose-Capillary: 97 mg/dL (ref 70–99)

## 2021-02-25 LAB — CREATININE, SERUM
Creatinine, Ser: 1.84 mg/dL — ABNORMAL HIGH (ref 0.61–1.24)
GFR, Estimated: 35 mL/min — ABNORMAL LOW (ref >60)

## 2021-02-25 LAB — MRSA PCR SCREENING: MRSA by PCR: NEGATIVE

## 2021-02-25 LAB — BUN: BUN: 37 mg/dL — ABNORMAL HIGH (ref 8–23)

## 2021-02-25 SURGERY — CAROTID PTA/STENT INTERVENTION
Anesthesia: Moderate Sedation | Laterality: Left

## 2021-02-25 MED ORDER — HEPARIN SODIUM (PORCINE) 1000 UNIT/ML IJ SOLN
INTRAMUSCULAR | Status: DC | PRN
  Administered 2021-02-25: 2000 [IU] via INTRAVENOUS
  Administered 2021-02-25: 7000 [IU] via INTRAVENOUS

## 2021-02-25 MED ORDER — ACETAMINOPHEN 325 MG PO TABS
325.0000 mg | ORAL_TABLET | ORAL | Status: DC | PRN
Start: 2021-02-25 — End: 2021-02-27

## 2021-02-25 MED ORDER — ATROPINE SULFATE 1 MG/10ML IJ SOSY
PREFILLED_SYRINGE | INTRAMUSCULAR | Status: AC
  Filled 2021-02-25: qty 10

## 2021-02-25 MED ORDER — HYDRALAZINE HCL 20 MG/ML IJ SOLN: 5.0000 mg | INTRAMUSCULAR | Status: DC | PRN

## 2021-02-25 MED ORDER — SERTRALINE HCL 50 MG PO TABS
50.0000 mg | ORAL_TABLET | Freq: Every day | ORAL | Status: DC
  Administered 2021-02-26: 50 mg via ORAL
  Filled 2021-02-25: qty 1

## 2021-02-25 MED ORDER — FAMOTIDINE IN NACL 20-0.9 MG/50ML-% IV SOLN
20.0000 mg | Freq: Two times a day (BID) | INTRAVENOUS | Status: DC
  Administered 2021-02-25 – 2021-02-26 (×3): 20 mg via INTRAVENOUS
  Filled 2021-02-25 (×3): qty 50

## 2021-02-25 MED ORDER — GABAPENTIN 300 MG PO CAPS
300.0000 mg | ORAL_CAPSULE | Freq: Three times a day (TID) | ORAL | Status: DC
  Administered 2021-02-25 – 2021-02-26 (×4): 300 mg via ORAL
  Filled 2021-02-25 (×4): qty 1

## 2021-02-25 MED ORDER — CLINDAMYCIN PHOSPHATE 300 MG/50ML IV SOLN
INTRAVENOUS | Status: AC
  Administered 2021-02-25: 300 mg via INTRAVENOUS
  Filled 2021-02-25: qty 50

## 2021-02-25 MED ORDER — FUROSEMIDE 20 MG PO TABS
20.0000 mg | ORAL_TABLET | Freq: Every day | ORAL | Status: DC | PRN
  Administered 2021-02-26: 20 mg via ORAL
  Filled 2021-02-25: qty 1

## 2021-02-25 MED ORDER — FLUTICASONE FUROATE-VILANTEROL 100-25 MCG/INH IN AEPB
1.0000 | INHALATION_SPRAY | Freq: Every day | RESPIRATORY_TRACT | Status: DC
  Administered 2021-02-26: 1 via RESPIRATORY_TRACT
  Filled 2021-02-25: qty 28

## 2021-02-25 MED ORDER — UMECLIDINIUM BROMIDE 62.5 MCG/INH IN AEPB
1.0000 | INHALATION_SPRAY | Freq: Every day | RESPIRATORY_TRACT | Status: DC
  Administered 2021-02-26: 1 via RESPIRATORY_TRACT
  Filled 2021-02-25: qty 7

## 2021-02-25 MED ORDER — FAMOTIDINE 20 MG PO TABS: 40.0000 mg | ORAL_TABLET | Freq: Once | ORAL | Status: DC | PRN

## 2021-02-25 MED ORDER — DOPAMINE-DEXTROSE 3.2-5 MG/ML-% IV SOLN
INTRAVENOUS | Status: AC
  Filled 2021-02-25: qty 250

## 2021-02-25 MED ORDER — SODIUM CHLORIDE 0.9 % IV SOLN: INTRAVENOUS | Status: DC

## 2021-02-25 MED ORDER — METHYLPREDNISOLONE SODIUM SUCC 125 MG IJ SOLR: 125.0000 mg | Freq: Once | INTRAMUSCULAR | Status: DC | PRN

## 2021-02-25 MED ORDER — HYDROMORPHONE HCL 1 MG/ML IJ SOLN: 1.0000 mg | Freq: Once | INTRAMUSCULAR | Status: DC | PRN

## 2021-02-25 MED ORDER — PHENOL 1.4 % MT LIQD
1.0000 | OROMUCOSAL | Status: DC | PRN
  Filled 2021-02-25: qty 177

## 2021-02-25 MED ORDER — HYDROCHLOROTHIAZIDE 25 MG PO TABS
25.0000 mg | ORAL_TABLET | Freq: Every day | ORAL | Status: DC
  Administered 2021-02-25 – 2021-02-26 (×2): 25 mg via ORAL
  Filled 2021-02-25 (×2): qty 1

## 2021-02-25 MED ORDER — FLUTICASONE PROPIONATE 50 MCG/ACT NA SUSP
2.0000 | Freq: Every evening | NASAL | Status: DC
  Administered 2021-02-25 – 2021-02-26 (×2): 2 via NASAL
  Filled 2021-02-25: qty 16

## 2021-02-25 MED ORDER — ATROPINE SULFATE 1 MG/10ML IJ SOSY
PREFILLED_SYRINGE | INTRAMUSCULAR | Status: AC
  Filled 2021-02-25: qty 20

## 2021-02-25 MED ORDER — FENTANYL CITRATE (PF) 100 MCG/2ML IJ SOLN
INTRAMUSCULAR | Status: AC
  Filled 2021-02-25: qty 2

## 2021-02-25 MED ORDER — VANCOMYCIN HCL IN DEXTROSE 1-5 GM/200ML-% IV SOLN
1000.0000 mg | Freq: Two times a day (BID) | INTRAVENOUS | Status: AC
Start: 2021-02-25 — End: 2021-02-26
  Administered 2021-02-25 (×2): 1000 mg via INTRAVENOUS
  Filled 2021-02-25 (×2): qty 200

## 2021-02-25 MED ORDER — CLOPIDOGREL BISULFATE 75 MG PO TABS
75.0000 mg | ORAL_TABLET | Freq: Every day | ORAL | Status: DC
  Administered 2021-02-25 – 2021-02-26 (×2): 75 mg via ORAL
  Filled 2021-02-25 (×2): qty 1

## 2021-02-25 MED ORDER — IPRATROPIUM-ALBUTEROL 0.5-2.5 (3) MG/3ML IN SOLN
3.0000 mL | RESPIRATORY_TRACT | Status: DC | PRN
  Administered 2021-02-26: 3 mL via RESPIRATORY_TRACT
  Filled 2021-02-25: qty 3

## 2021-02-25 MED ORDER — PANTOPRAZOLE SODIUM 40 MG PO TBEC: 40.0000 mg | DELAYED_RELEASE_TABLET | Freq: Every day | ORAL | Status: DC

## 2021-02-25 MED ORDER — MIDAZOLAM HCL 2 MG/ML PO SYRP: 8.0000 mg | ORAL_SOLUTION | Freq: Once | ORAL | Status: DC | PRN

## 2021-02-25 MED ORDER — ACETAMINOPHEN 325 MG RE SUPP
325.0000 mg | RECTAL | Status: DC | PRN
  Filled 2021-02-25: qty 2

## 2021-02-25 MED ORDER — IODIXANOL 320 MG/ML IV SOLN
INTRAVENOUS | Status: DC | PRN
  Administered 2021-02-25: 55 mL

## 2021-02-25 MED ORDER — ONDANSETRON HCL 4 MG/2ML IJ SOLN: 4.0000 mg | Freq: Four times a day (QID) | INTRAMUSCULAR | Status: DC | PRN

## 2021-02-25 MED ORDER — MIDAZOLAM HCL 2 MG/2ML IJ SOLN
INTRAMUSCULAR | Status: DC | PRN
  Administered 2021-02-25: 2 mg via INTRAVENOUS

## 2021-02-25 MED ORDER — LINAGLIPTIN 5 MG PO TABS
5.0000 mg | ORAL_TABLET | Freq: Every day | ORAL | Status: DC
  Administered 2021-02-25 – 2021-02-26 (×2): 5 mg via ORAL
  Filled 2021-02-25 (×2): qty 1

## 2021-02-25 MED ORDER — DOPAMINE-DEXTROSE 3.2-5 MG/ML-% IV SOLN
2.0000 ug/kg/min | INTRAVENOUS | Status: DC
  Administered 2021-02-25: 2 ug/kg/min via INTRAVENOUS
  Filled 2021-02-25: qty 250

## 2021-02-25 MED ORDER — TERAZOSIN HCL 2 MG PO CAPS
2.0000 mg | ORAL_CAPSULE | Freq: Every day | ORAL | Status: DC
  Administered 2021-02-25: 2 mg via ORAL
  Filled 2021-02-25 (×2): qty 1

## 2021-02-25 MED ORDER — PHENYLEPHRINE HCL (PRESSORS) 10 MG/ML IV SOLN
INTRAVENOUS | Status: AC
  Filled 2021-02-25: qty 1

## 2021-02-25 MED ORDER — MAGNESIUM OXIDE 400 (241.3 MG) MG PO TABS
400.0000 mg | ORAL_TABLET | Freq: Every day | ORAL | Status: DC
  Administered 2021-02-25 – 2021-02-26 (×2): 400 mg via ORAL
  Filled 2021-02-25 (×2): qty 1

## 2021-02-25 MED ORDER — FENTANYL CITRATE (PF) 100 MCG/2ML IJ SOLN
INTRAMUSCULAR | Status: DC | PRN
  Administered 2021-02-25: 50 ug via INTRAVENOUS

## 2021-02-25 MED ORDER — CLOPIDOGREL BISULFATE 75 MG PO TABS: 75.0000 mg | ORAL_TABLET | Freq: Every day | ORAL | Status: DC

## 2021-02-25 MED ORDER — HEPARIN SODIUM (PORCINE) 1000 UNIT/ML IJ SOLN
INTRAMUSCULAR | Status: AC
  Filled 2021-02-25: qty 1

## 2021-02-25 MED ORDER — AMLODIPINE BESYLATE 5 MG PO TABS
10.0000 mg | ORAL_TABLET | Freq: Every day | ORAL | Status: DC
  Administered 2021-02-26: 10 mg via ORAL
  Filled 2021-02-25: qty 2

## 2021-02-25 MED ORDER — ASPIRIN EC 81 MG PO TBEC
81.0000 mg | DELAYED_RELEASE_TABLET | Freq: Every day | ORAL | Status: DC
  Administered 2021-02-26: 81 mg via ORAL
  Filled 2021-02-25: qty 1

## 2021-02-25 MED ORDER — CHLORHEXIDINE GLUCONATE CLOTH 2 % EX PADS
6.0000 | MEDICATED_PAD | Freq: Every day | CUTANEOUS | Status: DC
  Administered 2021-02-25: 6 via TOPICAL

## 2021-02-25 MED ORDER — LORATADINE 10 MG PO TABS
10.0000 mg | ORAL_TABLET | Freq: Every day | ORAL | Status: DC
  Administered 2021-02-25 – 2021-02-26 (×2): 10 mg via ORAL
  Filled 2021-02-25 (×2): qty 1

## 2021-02-25 MED ORDER — ADULT MULTIVITAMIN W/MINERALS CH
1.0000 | ORAL_TABLET | Freq: Every day | ORAL | Status: DC
  Administered 2021-02-26: 1 via ORAL
  Filled 2021-02-25: qty 1

## 2021-02-25 MED ORDER — SODIUM CHLORIDE 0.9 % IV SOLN: 500.0000 mL | Freq: Once | INTRAVENOUS | Status: DC | PRN

## 2021-02-25 MED ORDER — OXYCODONE-ACETAMINOPHEN 5-325 MG PO TABS
1.0000 | ORAL_TABLET | ORAL | Status: DC | PRN
  Administered 2021-02-25: 1 via ORAL
  Filled 2021-02-25: qty 1

## 2021-02-25 MED ORDER — LABETALOL HCL 5 MG/ML IV SOLN: 10.0000 mg | INTRAVENOUS | Status: DC | PRN

## 2021-02-25 MED ORDER — QUINAPRIL HCL 10 MG PO TABS
40.0000 mg | ORAL_TABLET | Freq: Every day | ORAL | Status: DC
Start: 2021-02-25 — End: 2021-02-26
  Administered 2021-02-25: 40 mg via ORAL
  Filled 2021-02-25 (×2): qty 4

## 2021-02-25 MED ORDER — FLUTICASONE-UMECLIDIN-VILANT 100-62.5-25 MCG/INH IN AEPB: 1.0000 | INHALATION_SPRAY | Freq: Every day | RESPIRATORY_TRACT | Status: DC

## 2021-02-25 MED ORDER — METOPROLOL TARTRATE 5 MG/5ML IV SOLN: 2.0000 mg | INTRAVENOUS | Status: DC | PRN

## 2021-02-25 MED ORDER — DIPHENHYDRAMINE HCL 50 MG/ML IJ SOLN: 50.0000 mg | Freq: Once | INTRAMUSCULAR | Status: DC | PRN

## 2021-02-25 MED ORDER — ATORVASTATIN CALCIUM 80 MG PO TABS
80.0000 mg | ORAL_TABLET | Freq: Every evening | ORAL | Status: DC
  Administered 2021-02-25 – 2021-02-26 (×2): 80 mg via ORAL
  Filled 2021-02-25: qty 1
  Filled 2021-02-25: qty 4
  Filled 2021-02-25: qty 1
  Filled 2021-02-25: qty 4

## 2021-02-25 MED ORDER — CLINDAMYCIN PHOSPHATE 300 MG/50ML IV SOLN: 300.0000 mg | Freq: Once | INTRAVENOUS | Status: AC

## 2021-02-25 MED ORDER — ATROPINE SULFATE 1 MG/10ML IJ SOSY
PREFILLED_SYRINGE | INTRAMUSCULAR | Status: DC | PRN
  Administered 2021-02-25: 1 mg via INTRAVENOUS

## 2021-02-25 MED ORDER — MIDAZOLAM HCL 5 MG/5ML IJ SOLN
INTRAMUSCULAR | Status: AC
  Filled 2021-02-25: qty 5

## 2021-02-25 MED ORDER — ALBUTEROL SULFATE HFA 108 (90 BASE) MCG/ACT IN AERS
2.0000 | INHALATION_SPRAY | RESPIRATORY_TRACT | Status: DC | PRN
  Filled 2021-02-25 (×3): qty 6.7

## 2021-02-25 MED ORDER — POTASSIUM CHLORIDE CRYS ER 20 MEQ PO TBCR: 20.0000 meq | EXTENDED_RELEASE_TABLET | Freq: Every day | ORAL | Status: DC | PRN

## 2021-02-25 MED ORDER — ASPIRIN 81 MG PO CHEW: 81.0000 mg | CHEWABLE_TABLET | Freq: Every day | ORAL | Status: DC

## 2021-02-25 MED ORDER — MAGNESIUM SULFATE 2 GM/50ML IV SOLN: 2.0000 g | Freq: Every day | INTRAVENOUS | Status: DC | PRN

## 2021-02-25 MED ORDER — MORPHINE SULFATE (PF) 4 MG/ML IV SOLN: 2.0000 mg | INTRAVENOUS | Status: DC | PRN

## 2021-02-25 MED ORDER — ALUM & MAG HYDROXIDE-SIMETH 200-200-20 MG/5ML PO SUSP: 15.0000 mL | ORAL | Status: DC | PRN

## 2021-02-25 SURGICAL SUPPLY — 21 items
BALLN VTRAC 4.5X30X135 (BALLOONS) ×2
BALLOON VTRAC 4.5X30X135 (BALLOONS) ×1 IMPLANT
CATH ANGIO 5F PIGTAIL 100CM (CATHETERS) ×2 IMPLANT
CATH BEACON 5 .035 100 JB2 TIP (CATHETERS) ×2 IMPLANT
CATH HEADHUNTER H1 5F 100CM (CATHETERS) ×2 IMPLANT
COVER DRAPE FLUORO 36X44 (DRAPES) ×2 IMPLANT
COVER PROBE U/S 5X48 (MISCELLANEOUS) ×2 IMPLANT
DEVICE EMBOSHIELD NAV6 4.0-7.0 (FILTER) ×2 IMPLANT
DEVICE SAFEGUARD 24CM (GAUZE/BANDAGES/DRESSINGS) ×2 IMPLANT
DEVICE STARCLOSE SE CLOSURE (Vascular Products) ×2 IMPLANT
DEVICE TORQUE (MISCELLANEOUS) ×2 IMPLANT
GLIDEWIRE ANGLED SS 035X260CM (WIRE) ×2 IMPLANT
KIT CAROTID MANIFOLD (MISCELLANEOUS) ×2 IMPLANT
KIT ENCORE 26 ADVANTAGE (KITS) ×2 IMPLANT
PACK ANGIOGRAPHY (CUSTOM PROCEDURE TRAY) ×2 IMPLANT
SHEATH BRITE TIP 5FRX11 (SHEATH) ×2 IMPLANT
SHEATH SHUTTLE 6FR (SHEATH) ×2 IMPLANT
STENT XACT CAR 9-7X40X136 (Permanent Stent) ×2 IMPLANT
SYR MEDRAD MARK 7 150ML (SYRINGE) ×2 IMPLANT
WIRE G VAS 035X260 STIFF (WIRE) ×2 IMPLANT
WIRE GUIDERIGHT .035X150 (WIRE) ×2 IMPLANT

## 2021-02-25 NOTE — Progress Notes (Signed)
During sleep, the patient desaturates to the upper 80's, placed a nasal cannula on patient at 2lpm. Also started dopamine drip due to heart rate dropping into the upper 30's occasionally and staying in the upper 40's, also because of blood pressure being soft.

## 2021-02-25 NOTE — Interval H&P Note (Signed)
History and Physical Interval Note:  02/25/2021 8:10 AM  Francisco Gibbs  has presented today for surgery, with the diagnosis of LT Carotid Stent    [facility]    Carotid artery stenosis Covid March 24.  The various methods of treatment have been discussed with the patient and family. After consideration of risks, benefits and other options for treatment, the patient has consented to  Procedure(s): CAROTID PTA/STENT INTERVENTION (Left) as a surgical intervention.  The patient's history has been reviewed, patient examined, no change in status, stable for surgery.  I have reviewed the patient's chart and labs.  Questions were answered to the patient's satisfaction.     Leotis Pain

## 2021-02-25 NOTE — Op Note (Signed)
OPERATIVE NOTE DATE: 02/25/2021  PROCEDURE: 1.  Ultrasound guidance for vascular access right femoral artery 2.  Placement of a 9 mm proximal, 7 mm distal 4 cm long Exact stent with the use of the NAV-6 embolic protection device in the left carotid artery  PRE-OPERATIVE DIAGNOSIS: 1. Bilateral carotid artery stenosis.   POST-OPERATIVE DIAGNOSIS:  Same as above  SURGEON: Leotis Pain, MD  ASSISTANT(S):  none  ANESTHESIA: local/MCS  ESTIMATED BLOOD LOSS:  25 cc  CONTRAST: 55 cc  FLUORO TIME: 6.1 minutes  MODERATE CONSCIOUS SEDATION TIME:  Approximately 38 minutes using 2 mg of Versed and 50 mcg of Fentanyl  FINDING(S): 1.   Approximately 80% moderately calcific left carotid artery stenosis  SPECIMEN(S):   none  INDICATIONS:   Patient is a 87 y.o. male who presents with significant bilateral carotid artery stenosis.  He has significant dizziness and possible symptoms of pain and cerebral hypoperfusion, but no focal stroke symptoms recently.  The patient has a very high lesion on the left that would not be amenable to open surgical therapy and carotid artery stenting was felt to be preferred to endarterectomy for that reason.  Risks and benefits were discussed and informed consent was obtained.   DESCRIPTION: After obtaining full informed written consent, the patient was brought back to the vascular suite and placed supine upon the table.  The patient received IV antibiotics prior to induction. Moderate conscious sedation was administered during a face to face encounter with the patient throughout the procedure with my supervision of the RN administering medicines and monitoring the patients vital signs and mental status throughout from the start of the procedure until the patient was taken to the recovery room.  After obtaining adequate anesthesia, the patient was prepped and draped in the standard fashion.   The right femoral artery was visualized with ultrasound and found to be  widely patent. It was then accessed under direct ultrasound guidance without difficulty with a Seldinger needle. A permanent image was recorded. A J-wire was placed and we then placed a 6 French sheath. The patient was then heparinized and a total of 9000 units of intravenous heparin were given and an ACT was checked to confirm successful anticoagulation. A pigtail catheter was then placed into the ascending aorta. 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 I then selectively cannulated the left common carotid artery without difficulty with a JB2 catheter after headhunter catheter would not cannulate the origin.  Using a Glidewire and help advance, I then lanced the JB2 catheter and advanced into the mid left common carotid artery.  Cervical and cerebral carotid angiography was then performed. There were no obvious intracranial filling defects with a small amount of left-to-right cross-filling. The carotid bifurcation demonstrated about an 80% left ICA stenosis about 2 cm above the origin of the ICA that was at least moderately calcific.  This is best seen in the lateral projection.  I then advanced into the external carotid artery with a Glidewire and the JB2 catheter and then exchanged for the Amplatz Super Stiff wire. Over the Amplatz Super Stiff wire, a 6 [place] shuttle sheath was placed into the mid common carotid artery. I then used the NAV-6  Embolic protection device and crossed the lesion and parked this in the distal internal carotid artery at the base of the skull.  I then selected a 9 mm proximal, 7 mm distal, 4 cm long exact stent. This was deployed across the lesion encompassing  it in its entirety. A 4.5 mm diameter by 3 cm length balloon was used to post dilate the stent. Only about a 15-20% residual stenosis was present after angioplasty. Completion angiogram showed normal intracranial filling without new defects. At this point I elected to terminate the procedure. The sheath  was removed and StarClose closure device was deployed in the right femoral artery with excellent hemostatic result. The patient was taken to the recovery room in stable condition having tolerated the procedure well.  COMPLICATIONS: none  CONDITION: stable  Leotis Pain 02/25/2021 9:08 AM   This note was created with Dragon Medical transcription system. Any errors in dictation are purely unintentional.

## 2021-02-26 ENCOUNTER — Inpatient Hospital Stay: Payer: Medicare Other

## 2021-02-26 DIAGNOSIS — I6522 Occlusion and stenosis of left carotid artery: Secondary | ICD-10-CM

## 2021-02-26 LAB — BASIC METABOLIC PANEL
Anion gap: 4 — ABNORMAL LOW (ref 5–15)
BUN: 36 mg/dL — ABNORMAL HIGH (ref 8–23)
CO2: 23 mmol/L (ref 22–32)
Calcium: 9 mg/dL (ref 8.9–10.3)
Chloride: 109 mmol/L (ref 98–111)
Creatinine, Ser: 1.6 mg/dL — ABNORMAL HIGH (ref 0.61–1.24)
GFR, Estimated: 42 mL/min — ABNORMAL LOW (ref >60)
Glucose, Bld: 164 mg/dL — ABNORMAL HIGH (ref 70–99)
Potassium: 5.2 mmol/L — ABNORMAL HIGH (ref 3.5–5.1)
Sodium: 136 mmol/L (ref 135–145)

## 2021-02-26 LAB — CBC
HCT: 27.3 % — ABNORMAL LOW (ref 39.0–52.0)
Hemoglobin: 8.6 g/dL — ABNORMAL LOW (ref 13.0–17.0)
MCH: 28.9 pg (ref 26.0–34.0)
MCHC: 31.5 g/dL (ref 30.0–36.0)
MCV: 91.6 fL (ref 80.0–100.0)
Platelets: 121 10*3/uL — ABNORMAL LOW (ref 150–400)
RBC: 2.98 MIL/uL — ABNORMAL LOW (ref 4.22–5.81)
RDW: 13.1 % (ref 11.5–15.5)
WBC: 5.5 10*3/uL (ref 4.0–10.5)
nRBC: 0 % (ref 0.0–0.2)

## 2021-02-26 NOTE — Progress Notes (Signed)
Femoral site intact, no ooze or bleeding. Pedal pulses intact by doppler bilaterally. Denies pain. Lasix and breathing tx administered. Pt ambulated in hall again this evening on room air sating 90-97%. Denies being SOB. No need for home O2 at this time.

## 2021-02-26 NOTE — Progress Notes (Signed)
Once patient woke up around 0330, his heart rate increased to upper 80's, stopped dopamine at that time, has maintained his heart rate above 50 since then.

## 2021-02-26 NOTE — Progress Notes (Signed)
SATURATION QUALIFICATIONS: (This note is used to comply with regulatory documentation for home oxygen)  Patient Saturations on Room Air at Rest = 91-93%  Patient Saturations on Room Air while Ambulating = 83%  Patient Saturations on 3 Liters of oxygen while Ambulating = 89-95%  Please briefly explain why patient needs home oxygen: Pt is de-sating with ambulation and exertion and requires extra oxygen while ambulating.

## 2021-02-26 NOTE — Progress Notes (Signed)
VSS. RN explained discharge instructions to pt and family at bedside, verbalized understanding. IVs removed. Pt assisted to dress and wheeled to medical mall entrance and assisted into private vehicle passenger side.

## 2021-02-26 NOTE — Discharge Instructions (Signed)
Vascular Surgery Discharge Instructions: 1) You may shower as of tomorrow.  Please keep clean and dry.  2) Please do not engage in strenuous activity or lifting greater than 10pounds until you are cleared first visit. 

## 2021-02-26 NOTE — Progress Notes (Signed)
[facility] Daily Progress Note  Subjective: 02/25/21: 1.  Ultrasound guidance for vascular access right femoral artery 2.  Placement of a 9 mm proximal, 7 mm distal 4 cm long Exact stent with the use of the NAV-6 embolic protection device in the left carotid artery  Patient without complaint this morning.  Some bradycardia and hypotension overnight requiring pressure support.  Resolved this AM.  Objective: Vitals:   02/26/21 1100 02/26/21 1200 02/26/21 1300 02/26/21 1400  BP: 117/65 112/72 (!) 127/55 (!) 117/49  Pulse: 62 (!) 58 62 60  Resp: 13 13  17   Temp:  (!) 97.5 F (36.4 C)    TempSrc:  Oral    SpO2: 90% 91% 96% 95%  Weight:      Height:        Intake/Output Summary (Last 24 hours) at 02/26/2021 1545 Last data filed at 02/26/2021 1331 Gross per 24 hour  Intake 1946.3 ml  Output 1650 ml  Net 296.3 ml   Physical Exam: A&Ox3, NAD Face: Symmetrical, tongue midline Neck: Trachea midline. CV: RRR Pulmonary: CTA Bilaterally Abdomen: Soft, Nontender, Nondistended Right groin:  Access site: Clean dry and intact Vascular: Warm distally to toes Neurologic: Intact, no deficits noted on exam   Laboratory: CBC    Component Value Date/Time   WBC 5.5 02/26/2021 0507   HGB 8.6 (L) 02/26/2021 0507   HGB 12.9 (L) 12/12/2011 1700   HCT 27.3 (L) 02/26/2021 0507   HCT 38.5 (L) 12/12/2011 1700   PLT 121 (L) 02/26/2021 0507   PLT 171 12/12/2011 1700   BMET    Component Value Date/Time   NA 136 02/26/2021 0507   NA 140 12/12/2011 1700   K 5.2 (H) 02/26/2021 0507   K 4.4 12/12/2011 1700   CL 109 02/26/2021 0507   CL 103 12/12/2011 1700   CO2 23 02/26/2021 0507   CO2 29 12/12/2011 1700   GLUCOSE 164 (H) 02/26/2021 0507   GLUCOSE 134 (H) 12/12/2011 1700   BUN 36 (H) 02/26/2021 0507   BUN 17 12/12/2011 1700   CREATININE 1.60 (H) 02/26/2021 0507   CREATININE 0.80 12/12/2011 1700   CALCIUM 9.0 02/26/2021 0507   CALCIUM 9.7 12/12/2011 1700   GFRNONAA  42 (L) 02/26/2021 0507   GFRNONAA >60 12/12/2011 1700   GFRAA >60 10/21/2018 0344   GFRAA >60 12/12/2011 1700   Assessment/Planning: Patient is an 87 year old male s/p right carotid stent placement - POD#1  1) Notified by bedside nurse: Marykay Lex he ambulated around nurse station but de-sat to mid/lower 80s on room air. Sats mid 90s on 3 L while ambulating. at rest room air he sats 91-93%. 2) Consulted pulmonary on-call Dr. Raul Del (through [facility] order and Epic messaging) as well as social work for possible home oxygen. Pending consults. 3) AM Labs 4) Bradycardia / hypotension overnight requiring pressure support with dopamine. This resolved the following day. 5) CXR: Low lung volumes with mild bibasilar atelectasis/infiltrates. 6) Patient has yet to receive Lasix and PRN pulmonary meds.  Will plan on repeating ambulation with O2 sat after.   Discussed with Dr. Ellis Parents [name] 02/26/2021 3:45 PM

## 2021-02-27 LAB — POCT ACTIVATED CLOTTING TIME: Activated Clotting Time: 261 seconds

## 2021-02-27 NOTE — Discharge Summary (Signed)
Francisco Gibbs    Discharge Summary  Patient ID:  Francisco Gibbs MRN: [id number] DOB/AGE: [birth year] 87 y.o.  Admit date: 02/25/2021 Discharge date: 02/27/2021 Date of Surgery: 02/25/2021 Surgeon: Surgeon(s): Algernon Huxley, MD  Admission Diagnosis: Carotid stenosis, left [I65.22]  Discharge Diagnoses:  Carotid stenosis, left [I65.22]  Secondary Diagnoses: Past Medical History:  Diagnosis Date  . Aortic stenosis   . Basal cell carcinoma 03/16/2020   R tip of nose, R preauricular area  . COPD (chronic obstructive pulmonary disease) ([place])   . Diabetes mellitus without complication ([place])    Pt takes Metformin.  Marland Kitchen GERD (gastroesophageal reflux disease)   . HLD (hyperlipidemia)   . Hypertension    Procedure(s): 02/25/21: 1. Ultrasound guidance for vascular access rightfemoral artery 2. Placement of a 9 mm proximal, 7 mm distal 4 cm longExact stent with the use of the NAV-6 embolic protection device in the leftcarotid artery  Discharged Condition: Good  HPI / Hospital Course:  Patient is a 87 y.o. male who presents with significant bilateral carotid artery stenosis.  He has significant dizziness and possible symptoms of pain and cerebral hypoperfusion, but no focal stroke symptoms recently.  The patient has a very high lesion on the left that would not be amenable to open surgical therapy and carotid artery stenting was felt to be preferred to endarterectomy for that reason.  Risks and benefits were discussed and informed consent was obtained. On 02/25/21, the patient underwent:  1.  Ultrasound guidance for vascular access right femoral artery 2.  Placement of a 9 mm proximal, 7 mm distal 4 cm long Exact stent with the use of the NAV-6 embolic protection device in the left carotid artery  The patient tolerated the procedure well was transferred from the angiography suite to the ICU for observation overnight.  During the patient's stay, the patient would  become bradycardic and hypoxic when sleeping.  Patient needed brief pressure support with dopamine which was discontinued by the following morning.  During ambulation, the patient mostly satting into the lower mid 80s however after receiving his as needed pulmonary medications as well as his at home Lasix dose his saturation improved with ambulation.  The patient was deemed appropriate to safely be discharged home.  During his stay, the patient's diet was advanced, he was urinating independently, his pain was controlled with the use of p.o. pain medication and he was ambulating at baseline.  Day of discharge, the patient was afebrile with stable vital signs.  Physical Exam:  A&Ox3, NAD Face: Symmetrical, tongue midline Neck: Trachea midline. CV: RRR Pulmonary: CTA Bilaterally Abdomen: Soft, Nontender, Nondistended Right groin:             Access site: Clean dry and intact Vascular: Warm distally to toes Neurologic: Intact, no deficits noted on exam  Labs: As below  Complications: None  Consults: None  Significant Diagnostic Studies: CBC Lab Results  Component Value Date   WBC 5.5 02/26/2021   HGB 8.6 (L) 02/26/2021   HCT 27.3 (L) 02/26/2021   MCV 91.6 02/26/2021   PLT 121 (L) 02/26/2021   BMET    Component Value Date/Time   NA 136 02/26/2021 0507   NA 140 12/12/2011 1700   K 5.2 (H) 02/26/2021 0507   K 4.4 12/12/2011 1700   CL 109 02/26/2021 0507   CL 103 12/12/2011 1700   CO2 23 02/26/2021 0507   CO2 29 12/12/2011 1700   GLUCOSE 164 (H) 02/26/2021 0507  GLUCOSE 134 (H) 12/12/2011 1700   BUN 36 (H) 02/26/2021 0507   BUN 17 12/12/2011 1700   CREATININE 1.60 (H) 02/26/2021 0507   CREATININE 0.80 12/12/2011 1700   CALCIUM 9.0 02/26/2021 0507   CALCIUM 9.7 12/12/2011 1700   GFRNONAA 42 (L) 02/26/2021 0507   GFRNONAA >60 12/12/2011 1700   GFRAA >60 10/21/2018 0344   GFRAA >60 12/12/2011 1700   COAG Lab Results  Component Value Date   INR 1.24 06/24/2018    Disposition:  Discharge to :Home  Allergies as of 02/26/2021      Reactions   Guaifenesin Shortness Of Breath   Other reaction(s): Other (See Comments)   Penicillin G Rash, Shortness Of Breath   Has patient had a PCN reaction causing immediate rash, facial/tongue/throat swelling, SOB or lightheadedness with hypotension: Yes Has patient had a PCN reaction causing severe rash involving mucus membranes or skin necrosis: No Has patient had a PCN reaction that required hospitalization: No Has patient had a PCN reaction occurring within the last 10 years: No If all of the above answers are "NO", then may proceed with Cephalosporin use. Has patient had a PCN reaction causing immediate rash, facial/tongue/throat swelling, SOB or lightheadedness with hypotension: Yes Has patient had a PCN reaction causing severe rash involving mucus membranes or skin necrosis: No Has patient had a PCN reaction that required hospitalization: No Has patient had a PCN reaction occurring within the last 10 years: No If all of the above answers are "NO", then may proceed with Cephalosporin use. Has patient had a PCN reaction causing immediate rash, facial/tongue/throat swelling, SOB or lightheadedness with hypotension: Yes Has patient had a PCN reaction causing severe rash involving mucus membranes or skin necrosis: No Has patient had a PCN reaction that required hospitalization: No Has patient had a PCN reaction occurring within the last 10 years: No If all of the above answers are "NO", then may proceed with Cephalosporin use. Has patient had a PCN reaction causing immediate rash, facial/tongue/throat swelling, SOB or lightheadedness with hypotension: Yes Has patient had a PCN reaction causing severe rash involving mucus membranes or skin necrosis: No Has patient had a PCN reaction that required hospitalization: No Has patient had a PCN reaction occurring within the last 10 years: NoIf all of the above answers are  "NO", then may proceed with Cephalosporin use. Has patient had a PCN reaction causing immediate rash, facial/tongue/throat swelling, SOB or lightheadedness with hypotension: Yes Has patient had a PCN reaction causing se... (TRUNCATED)   Sulfa Antibiotics Shortness Of Breath      Medication List    TAKE these medications   albuterol 108 (90 Base) MCG/ACT inhaler Commonly known as: VENTOLIN HFA Inhale 2 puffs into the lungs every 4 (four) hours as needed for wheezing or shortness of breath.   amLODipine 10 MG tablet Commonly known as: NORVASC Take 10 mg by mouth daily.   aspirin 81 MG chewable tablet Chew 81 mg by mouth daily.   atorvastatin 80 MG tablet Commonly known as: LIPITOR Take 80 mg by mouth every evening.   cetirizine 10 MG tablet Commonly known as: ZYRTEC Take 10 mg by mouth every evening.   clopidogrel 75 MG tablet Commonly known as: PLAVIX Take 1 tablet (75 mg total) by mouth daily.   fluticasone 50 MCG/ACT nasal spray Commonly known as: FLONASE Place 2 sprays into the nose every evening.   furosemide 20 MG tablet Commonly known as: LASIX Take 20 mg by mouth daily as needed  for fluid.   gabapentin 300 MG capsule Commonly known as: NEURONTIN Take 300 mg by mouth 3 (three) times daily.   hydrochlorothiazide 25 MG tablet Commonly known as: HYDRODIURIL Take 25 mg by mouth daily.   ipratropium-albuterol 0.5-2.5 (3) MG/3ML Soln Commonly known as: DUONEB Take 3 mLs by nebulization every 6 (six) hours as needed. What changed: when to take this   magnesium oxide 400 (241.3 Mg) MG tablet Commonly known as: MAG-OX Take 1 tablet (400 mg total) by mouth daily.   metFORMIN 500 MG 24 hr tablet Commonly known as: GLUCOPHAGE-XR Take 1,000 mg by mouth 2 (two) times daily.   multivitamin with minerals tablet Take 1 tablet by mouth daily.   omeprazole 40 MG capsule Commonly known as: PRILOSEC Take 40 mg by mouth every evening.   quinapril 40 MG  tablet Commonly known as: ACCUPRIL Take 40 mg by mouth daily.   sertraline 25 MG tablet Commonly known as: ZOLOFT Take 50 mg by mouth in the morning and at bedtime.   sitaGLIPtin 50 MG tablet Commonly known as: JANUVIA Take 50 mg by mouth daily.   terazosin 2 MG capsule Commonly known as: HYTRIN Take 2 mg by mouth at bedtime.   Trelegy Ellipta 100-62.5-25 MCG/INH Aepb Generic drug: Fluticasone-Umeclidin-Vilant Inhale 1 puff into the lungs daily.   Vitamin D (Ergocalciferol) 1.25 MG (50000 UNIT) Caps capsule Commonly known as: DRISDOL Take 50,000 Units by mouth every Saturday.   Voltaren 1 % Gel Generic drug: diclofenac Sodium Apply  a small amount to affected area four times a day as needed      Verbal and written Discharge instructions given to the patient. Wound care per Discharge AVS  Follow-up Information    Dew, Erskine Squibb, MD Follow up in 1 month(s).   Specialties: Vascular Surgery, Radiology, Interventional Cardiology Why: Can see Dew or Arna Medici. Will need carotid duplex with visit.  Contact information: [place] [address] [phone]              Signed: Sela Hua, PA-C  02/27/2021, 9:02 AM

## 2021-03-27 ENCOUNTER — Other Ambulatory Visit (INDEPENDENT_AMBULATORY_CARE_PROVIDER_SITE_OTHER): Payer: Self-pay | Admitting: Vascular Surgery

## 2021-03-27 DIAGNOSIS — Z959 Presence of cardiac and vascular implant and graft, unspecified: Secondary | ICD-10-CM

## 2021-03-27 DIAGNOSIS — I6522 Occlusion and stenosis of left carotid artery: Secondary | ICD-10-CM

## 2021-03-28 ENCOUNTER — Other Ambulatory Visit: Payer: Self-pay

## 2021-03-28 ENCOUNTER — Ambulatory Visit (INDEPENDENT_AMBULATORY_CARE_PROVIDER_SITE_OTHER): Payer: Medicare Other | Admitting: Nurse Practitioner

## 2021-03-28 ENCOUNTER — Encounter (INDEPENDENT_AMBULATORY_CARE_PROVIDER_SITE_OTHER): Payer: Self-pay | Admitting: Nurse Practitioner

## 2021-03-28 ENCOUNTER — Ambulatory Visit (INDEPENDENT_AMBULATORY_CARE_PROVIDER_SITE_OTHER): Payer: Medicare Other

## 2021-03-28 VITALS — BP 152/68 | HR 73 | Resp 17 | Ht 67.0 in | Wt 182.0 lb

## 2021-03-28 DIAGNOSIS — Z959 Presence of cardiac and vascular implant and graft, unspecified: Secondary | ICD-10-CM

## 2021-03-28 DIAGNOSIS — I6522 Occlusion and stenosis of left carotid artery: Secondary | ICD-10-CM | POA: Diagnosis not present

## 2021-03-28 DIAGNOSIS — I10 Essential (primary) hypertension: Secondary | ICD-10-CM

## 2021-03-28 DIAGNOSIS — E785 Hyperlipidemia, unspecified: Secondary | ICD-10-CM

## 2021-03-28 DIAGNOSIS — E119 Type 2 diabetes mellitus without complications: Secondary | ICD-10-CM

## 2021-03-31 ENCOUNTER — Encounter (INDEPENDENT_AMBULATORY_CARE_PROVIDER_SITE_OTHER): Payer: Self-pay | Admitting: Nurse Practitioner

## 2021-03-31 NOTE — Progress Notes (Signed)
Subjective:    Patient ID: Francisco Gibbs, male    DOB: [birth year], 87 y.o.   MRN: [id number] Chief Complaint  Patient presents with  . Follow-up    ultrasound    The patient is seen for follow up evaluation of carotid stenosis status post left ICA stent placement on 02/25/2021.  There were no post operative problems or complications related to the surgery.  The patient denies neck or incisional pain.  The patient denies interval amaurosis fugax. There is no recent history of TIA symptoms or focal motor deficits. There is no prior documented CVA.  The patient denies headache.  The patient is taking enteric-coated aspirin 81 mg daily.  The patient has a history of coronary artery disease, no recent episodes of angina or shortness of breath. The patient denies PAD or claudication symptoms. There is a history of hyperlipidemia which is being treated with a statin.   Today noninvasive studies show a patent left ICA stent with a 1 to 39% narrowing.  The patent ICA stent has mild postop narrowing with normal flow.  The velocities in the right ICA consistent with a 60 to 79% stenosis.  The bilateral vertebral arteries demonstrate antegrade flow.  There are normal flow hemodynamics in the bilateral subclavian arteries however it is noted that the velocities in the right subclavian are half of the velocities left subclavian.  There also notable blood pressure differences in each arm.   Review of Systems  Eyes: Negative for visual disturbance.  Skin: Negative for wound.  Neurological: Negative for headaches.  All other systems reviewed and are negative.      Objective:   Physical Exam Vitals reviewed.  HENT:     Head: Normocephalic.  Neck:     Vascular: Carotid bruit present.  Cardiovascular:     Rate and Rhythm: Normal rate.     Pulses: Normal pulses.  Pulmonary:     Effort: Pulmonary effort is normal.  Neurological:     Mental Status: He is alert and oriented to person, place, and  time.  Psychiatric:        Mood and Affect: Mood normal.        Behavior: Behavior normal.        Thought Content: Thought content normal.        Judgment: Judgment normal.     BP (!) 152/68 (BP Location: Left Arm)   Pulse 73   Resp 17   Ht 5\' 7"  (1.702 m)   Wt 182 lb (82.6 kg)   BMI 28.51 kg/m   Past Medical History:  Diagnosis Date  . Aortic stenosis   . Basal cell carcinoma 03/16/2020   R tip of nose, R preauricular area, Moh's 06/06/2020  . COPD (chronic obstructive pulmonary disease) ([place])   . Diabetes mellitus without complication ([place])    Pt takes Metformin.  Marland Kitchen GERD (gastroesophageal reflux disease)   . HLD (hyperlipidemia)   . Hypertension     Social History   Socioeconomic History  . Marital status: Divorced    Spouse name: Not on file  . Number of children: Not on file  . Years of education: Not on file  . Highest education level: Not on file  Occupational History  . Not on file  Tobacco Use  . Smoking status: Former Smoker    Packs/day: 1.00    Years: 70.00    Pack years: 70.00    Types: Cigarettes  . Smokeless tobacco: Never Used  Substance and Sexual  Activity  . Alcohol use: Not Currently  . Drug use: Never  . Sexual activity: Not on file  Other Topics Concern  . Not on file  Social History Narrative  . Not on file   Social Determinants of Health   Financial Resource Strain: Not on file  Food Insecurity: Not on file  Transportation Needs: Not on file  Physical Activity: Not on file  Stress: Not on file  Social Connections: Not on file  Intimate Partner Violence: Not on file    Past Surgical History:  Procedure Laterality Date  . CAROTID PTA/STENT INTERVENTION Left 02/25/2021   Procedure: CAROTID PTA/STENT INTERVENTION;  Surgeon: Algernon Huxley, MD;  Location: [facility];  Service: Cardiovascular;  Laterality: Left;  . HYDROCELE EXCISION / REPAIR    . INGUINAL HERNIA REPAIR    . KNEE SURGERY    . LEFT HEART CATH AND CORONARY  ANGIOGRAPHY Left 01/30/2021   Procedure: LEFT HEART CATH AND CORONARY ANGIOGRAPHY;  Surgeon: Yolonda Kida, MD;  Location: [facility];  Service: Cardiovascular;  Laterality: Left;    Family History  Problem Relation Age of Onset  . Diabetes Sister   . Diabetes Paternal Aunt     Allergies  Allergen Reactions  . Guaifenesin Shortness Of Breath    Other reaction(s): Other (See Comments)  . Penicillin G Rash and Shortness Of Breath    Has patient had a PCN reaction causing immediate rash, facial/tongue/throat swelling, SOB or lightheadedness with hypotension: Yes Has patient had a PCN reaction causing severe rash involving mucus membranes or skin necrosis: No Has patient had a PCN reaction that required hospitalization: No Has patient had a PCN reaction occurring within the last 10 years: No If all of the above answers are "NO", then may proceed with Cephalosporin use. Has patient had a PCN reaction causing immediate rash, facial/tongue/throat swelling, SOB or lightheadedness with hypotension: Yes Has patient had a PCN reaction causing severe rash involving mucus membranes or skin necrosis: No Has patient had a PCN reaction that required hospitalization: No Has patient had a PCN reaction occurring within the last 10 years: No If all of the above answers are "NO", then may proceed with Cephalosporin use. Has patient had a PCN reaction causing immediate rash, facial/tongue/throat swelling, SOB or lightheadedness with hypotension: Yes Has patient had a PCN reaction causing severe rash involving mucus membranes or skin necrosis: No Has patient had a PCN reaction that required hospitalization: No Has patient had a PCN reaction occurring within the last 10 years: No If all of the above answers are "NO", then may proceed with Cephalosporin use. Has patient had a PCN reaction causing immediate rash, facial/tongue/throat swelling, SOB or lightheadedness with hypotension: Yes Has  patient had a PCN reaction causing severe rash involving mucus membranes or skin necrosis: No Has patient had a PCN reaction that required hospitalization: No Has patient had a PCN reaction occurring within the last 10 years: NoIf all of the above answers are "NO", then may proceed with Cephalosporin use. Has patient had a PCN reaction causing immediate rash, facial/tongue/throat swelling, SOB or lightheadedness with hypotension: Yes Has patient had a PCN reaction causing se... (TRUNCATED)  . Sulfa Antibiotics Shortness Of Breath    CBC Latest Ref Rng & Units 02/26/2021 10/20/2018 10/19/2018  WBC 4.0 - 10.5 K/uL 5.5 4.4 5.4  Hemoglobin 13.0 - 17.0 g/dL 8.6(L) 8.8(L) 10.2(L)  Hematocrit 39.0 - 52.0 % 27.3(L) 28.0(L) 32.1(L)  Platelets 150 - 400 K/uL  121(L) 155 181      CMP     Component Value Date/Time   NA 136 02/26/2021 0507   NA 140 12/12/2011 1700   K 5.2 (H) 02/26/2021 0507   K 4.4 12/12/2011 1700   CL 109 02/26/2021 0507   CL 103 12/12/2011 1700   CO2 23 02/26/2021 0507   CO2 29 12/12/2011 1700   GLUCOSE 164 (H) 02/26/2021 0507   GLUCOSE 134 (H) 12/12/2011 1700   BUN 36 (H) 02/26/2021 0507   BUN 17 12/12/2011 1700   CREATININE 1.60 (H) 02/26/2021 0507   CREATININE 0.80 12/12/2011 1700   CALCIUM 9.0 02/26/2021 0507   CALCIUM 9.7 12/12/2011 1700   PROT 6.8 06/24/2018 2131   PROT 7.4 12/09/2011 1223   ALBUMIN 3.7 06/24/2018 2131   ALBUMIN 4.2 12/09/2011 1223   AST 30 06/24/2018 2131   AST 32 12/09/2011 1223   ALT 24 06/24/2018 2131   ALT 33 12/09/2011 1223   ALKPHOS 58 06/24/2018 2131   ALKPHOS 61 12/09/2011 1223   BILITOT 0.9 06/24/2018 2131   BILITOT 0.3 12/09/2011 1223   GFRNONAA 42 (L) 02/26/2021 0507   GFRNONAA >60 12/12/2011 1700   GFRAA >60 10/21/2018 0344   GFRAA >60 12/12/2011 1700     No results found.     Assessment & Plan:   1. Carotid stenosis, left Recommend:  The patient is s/p successful stenting of left ICA.  Duplex ultrasound  preoperatively shows 60 to 79% contralateral stenosis.  Continue antiplatelet therapy as prescribed Continue management of CAD, HTN and Hyperlipidemia Healthy heart diet,  encouraged exercise at least 4 times per week  Follow up in 6 weeks with duplex ultrasound to discuss possible intervention of the right ICA.  Discussed with patient that sometimes following intervention velocities may decrease when there are high-grade stenosis bilaterally.  Alternatively remains high discussion about intervention will take place.  2. Essential hypertension Continue antihypertensive medications as already ordered, these medications have been reviewed and there are no changes at this time.   3. Type 2 diabetes mellitus without complication, without long-term current use of insulin (HCC) Continue hypoglycemic medications as already ordered, these medications have been reviewed and there are no changes at this time.  Hgb A1C to be monitored as already arranged by primary service   4. Hyperlipidemia, unspecified hyperlipidemia type Continue statin as ordered and reviewed, no changes at this time    Current Outpatient Medications on File Prior to Visit  Medication Sig Dispense Refill  . albuterol (PROVENTIL HFA;VENTOLIN HFA) 108 (90 Base) MCG/ACT inhaler Inhale 2 puffs into the lungs every 4 (four) hours as needed for wheezing or shortness of breath.    Marland Kitchen amLODipine (NORVASC) 10 MG tablet Take 10 mg by mouth daily.    Marland Kitchen aspirin 81 MG chewable tablet Chew 81 mg by mouth daily.     Marland Kitchen atorvastatin (LIPITOR) 80 MG tablet Take 80 mg by mouth every evening.   1  . cetirizine (ZYRTEC) 10 MG tablet Take 10 mg by mouth every evening.   1  . clopidogrel (PLAVIX) 75 MG tablet Take 1 tablet (75 mg total) by mouth daily. 30 tablet 6  . fluticasone (FLONASE) 50 MCG/ACT nasal spray Place 2 sprays into the nose every evening.     . Fluticasone-Umeclidin-Vilant (TRELEGY ELLIPTA) 100-62.5-25 MCG/INH AEPB Inhale 1 puff  into the lungs daily.    . furosemide (LASIX) 20 MG tablet Take 20 mg by mouth daily as needed for fluid.    Marland Kitchen  gabapentin (NEURONTIN) 300 MG capsule Take 300 mg by mouth 3 (three) times daily.     . hydrochlorothiazide (HYDRODIURIL) 25 MG tablet Take 25 mg by mouth daily.    Marland Kitchen ipratropium-albuterol (DUONEB) 0.5-2.5 (3) MG/3ML SOLN Take 3 mLs by nebulization every 6 (six) hours as needed. (Patient taking differently: Take 3 mLs by nebulization in the morning and at bedtime.) 360 mL 0  . magnesium oxide (MAG-OX) 400 (241.3 Mg) MG tablet Take 1 tablet (400 mg total) by mouth daily. 5 tablet 0  . metFORMIN (GLUCOPHAGE-XR) 500 MG 24 hr tablet Take 1,000 mg by mouth 2 (two) times daily.    . Multiple Vitamins-Minerals (MULTIVITAMIN WITH MINERALS) tablet Take 1 tablet by mouth daily.    Marland Kitchen omeprazole (PRILOSEC) 40 MG capsule Take 40 mg by mouth every evening.  1  . quinapril (ACCUPRIL) 40 MG tablet Take 40 mg by mouth daily.   1  . sertraline (ZOLOFT) 25 MG tablet Take 50 mg by mouth in the morning and at bedtime.    . sitaGLIPtin (JANUVIA) 50 MG tablet Take 50 mg by mouth daily.    Marland Kitchen terazosin (HYTRIN) 2 MG capsule Take 2 mg by mouth at bedtime.    . Vitamin D, Ergocalciferol, (DRISDOL) 50000 units CAPS capsule Take 50,000 Units by mouth every Saturday.   3  . VOLTAREN 1 % GEL Apply  a small amount to affected area four times a day as needed (Patient not taking: Reported on 03/28/2021)     No current facility-administered medications on file prior to visit.    There are no Patient Instructions on file for this visit. No follow-ups on file.   Kris Hartmann, NP

## 2021-05-03 ENCOUNTER — Other Ambulatory Visit (INDEPENDENT_AMBULATORY_CARE_PROVIDER_SITE_OTHER): Payer: Self-pay | Admitting: Nurse Practitioner

## 2021-05-03 DIAGNOSIS — I6523 Occlusion and stenosis of bilateral carotid arteries: Secondary | ICD-10-CM

## 2021-05-07 ENCOUNTER — Ambulatory Visit (INDEPENDENT_AMBULATORY_CARE_PROVIDER_SITE_OTHER): Payer: Medicare Other

## 2021-05-07 ENCOUNTER — Other Ambulatory Visit: Payer: Self-pay

## 2021-05-07 ENCOUNTER — Ambulatory Visit (INDEPENDENT_AMBULATORY_CARE_PROVIDER_SITE_OTHER): Payer: Medicare Other | Admitting: Vascular Surgery

## 2021-05-07 ENCOUNTER — Encounter (INDEPENDENT_AMBULATORY_CARE_PROVIDER_SITE_OTHER): Payer: Self-pay | Admitting: Vascular Surgery

## 2021-05-07 VITALS — BP 115/53 | HR 71 | Ht 67.0 in | Wt 184.0 lb

## 2021-05-07 DIAGNOSIS — I10 Essential (primary) hypertension: Secondary | ICD-10-CM

## 2021-05-07 DIAGNOSIS — I6523 Occlusion and stenosis of bilateral carotid arteries: Secondary | ICD-10-CM | POA: Diagnosis not present

## 2021-05-07 DIAGNOSIS — E119 Type 2 diabetes mellitus without complications: Secondary | ICD-10-CM

## 2021-05-07 NOTE — Assessment & Plan Note (Signed)
blood pressure control important in reducing the progression of atherosclerotic disease. On appropriate oral medications.  

## 2021-05-07 NOTE — Assessment & Plan Note (Signed)
Duplex shows some narrowing within the stent but the velocities would fall in the 1 to 39% range.  The right carotid artery velocities are in the 60 to 79% range at this point.  Doing well.  Continue dual antiplatelet therapy.  Plan an ultrasound in about 3 months for follow-up.

## 2021-05-07 NOTE — Assessment & Plan Note (Signed)
blood glucose control important in reducing the progression of atherosclerotic disease. Also, involved in wound healing. On appropriate medications.  

## 2021-05-07 NOTE — Progress Notes (Signed)
Patient ID: RAFFERTY POSTLEWAIT, male   DOB: Sep 25, 1933, 87 y.o.   MRN: [id number]  Chief Complaint  Patient presents with  . Follow-up    6 wk carotid  . Carotid    HPI Francisco Gibbs is a 87 y.o. male.  Patient returns a little over two months s/p left carotid stent placement. He is doing well. No complaints today.  Duplex shows some narrowing within the stent but the velocities would fall in the 1 to 39% range.  The right carotid artery velocities are in the 60 to 79% range at this point.   Past Medical History:  Diagnosis Date  . Aortic stenosis   . Basal cell carcinoma 03/16/2020   R tip of nose, R preauricular area, Moh's 06/06/2020  . COPD (chronic obstructive pulmonary disease) ([place])   . Diabetes mellitus without complication ([place])    Pt takes Metformin.  Marland Kitchen GERD (gastroesophageal reflux disease)   . HLD (hyperlipidemia)   . Hypertension     Past Surgical History:  Procedure Laterality Date  . CAROTID PTA/STENT INTERVENTION Left 02/25/2021   Procedure: CAROTID PTA/STENT INTERVENTION;  Surgeon: Algernon Huxley, MD;  Location: [facility];  Service: Cardiovascular;  Laterality: Left;  . HYDROCELE EXCISION / REPAIR    . INGUINAL HERNIA REPAIR    . KNEE SURGERY    . LEFT HEART CATH AND CORONARY ANGIOGRAPHY Left 01/30/2021   Procedure: LEFT HEART CATH AND CORONARY ANGIOGRAPHY;  Surgeon: Yolonda Kida, MD;  Location: [facility];  Service: Cardiovascular;  Laterality: Left;      Allergies  Allergen Reactions  . Guaifenesin Shortness Of Breath    Other reaction(s): Other (See Comments)  . Penicillin G Rash and Shortness Of Breath    Has patient had a PCN reaction causing immediate rash, facial/tongue/throat swelling, SOB or lightheadedness with hypotension: Yes Has patient had a PCN reaction causing severe rash involving mucus membranes or skin necrosis: No Has patient had a PCN reaction that required hospitalization: No Has patient had a PCN reaction  occurring within the last 10 years: No If all of the above answers are "NO", then may proceed with Cephalosporin use. Has patient had a PCN reaction causing immediate rash, facial/tongue/throat swelling, SOB or lightheadedness with hypotension: Yes Has patient had a PCN reaction causing severe rash involving mucus membranes or skin necrosis: No Has patient had a PCN reaction that required hospitalization: No Has patient had a PCN reaction occurring within the last 10 years: No If all of the above answers are "NO", then may proceed with Cephalosporin use. Has patient had a PCN reaction causing immediate rash, facial/tongue/throat swelling, SOB or lightheadedness with hypotension: Yes Has patient had a PCN reaction causing severe rash involving mucus membranes or skin necrosis: No Has patient had a PCN reaction that required hospitalization: No Has patient had a PCN reaction occurring within the last 10 years: No If all of the above answers are "NO", then may proceed with Cephalosporin use. Has patient had a PCN reaction causing immediate rash, facial/tongue/throat swelling, SOB or lightheadedness with hypotension: Yes Has patient had a PCN reaction causing severe rash involving mucus membranes or skin necrosis: No Has patient had a PCN reaction that required hospitalization: No Has patient had a PCN reaction occurring within the last 10 years: NoIf all of the above answers are "NO", then may proceed with Cephalosporin use. Has patient had a PCN reaction causing immediate rash, facial/tongue/throat swelling, SOB or lightheadedness  with hypotension: Yes Has patient had a PCN reaction causing se... (TRUNCATED)  . Sulfa Antibiotics Shortness Of Breath    Current Outpatient Medications  Medication Sig Dispense Refill  . albuterol (PROVENTIL HFA;VENTOLIN HFA) 108 (90 Base) MCG/ACT inhaler Inhale 2 puffs into the lungs every 4 (four) hours as needed for wheezing or shortness of breath.    Marland Kitchen  amLODipine (NORVASC) 10 MG tablet Take 10 mg by mouth daily.    Marland Kitchen aspirin 81 MG chewable tablet Chew 81 mg by mouth daily.     Marland Kitchen atorvastatin (LIPITOR) 80 MG tablet Take 80 mg by mouth every evening.   1  . cetirizine (ZYRTEC) 10 MG tablet Take 10 mg by mouth every evening.   1  . clopidogrel (PLAVIX) 75 MG tablet Take 1 tablet (75 mg total) by mouth daily. 30 tablet 6  . fluticasone (FLONASE) 50 MCG/ACT nasal spray Place 2 sprays into the nose every evening.     . Fluticasone-Umeclidin-Vilant (TRELEGY ELLIPTA) 100-62.5-25 MCG/INH AEPB Inhale 1 puff into the lungs daily.    . furosemide (LASIX) 20 MG tablet Take 20 mg by mouth daily as needed for fluid.    Marland Kitchen gabapentin (NEURONTIN) 300 MG capsule Take 300 mg by mouth 3 (three) times daily.     . hydrochlorothiazide (HYDRODIURIL) 25 MG tablet Take 25 mg by mouth daily.    Marland Kitchen ipratropium-albuterol (DUONEB) 0.5-2.5 (3) MG/3ML SOLN Take 3 mLs by nebulization every 6 (six) hours as needed. (Patient taking differently: Take 3 mLs by nebulization in the morning and at bedtime.) 360 mL 0  . magnesium oxide (MAG-OX) 400 (241.3 Mg) MG tablet Take 1 tablet (400 mg total) by mouth daily. 5 tablet 0  . metFORMIN (GLUCOPHAGE-XR) 500 MG 24 hr tablet Take 1,000 mg by mouth 2 (two) times daily.    . Multiple Vitamins-Minerals (MULTIVITAMIN WITH MINERALS) tablet Take 1 tablet by mouth daily.    Marland Kitchen omeprazole (PRILOSEC) 40 MG capsule Take 40 mg by mouth every evening.  1  . quinapril (ACCUPRIL) 40 MG tablet Take 40 mg by mouth daily.   1  . sertraline (ZOLOFT) 25 MG tablet Take 50 mg by mouth in the morning and at bedtime.    . sitaGLIPtin (JANUVIA) 50 MG tablet Take 50 mg by mouth daily.    Marland Kitchen terazosin (HYTRIN) 2 MG capsule Take 2 mg by mouth at bedtime.    . Vitamin D, Ergocalciferol, (DRISDOL) 50000 units CAPS capsule Take 50,000 Units by mouth every Saturday.   3  . VOLTAREN 1 % GEL      No current facility-administered medications for this visit.         Physical Exam BP (!) 115/53   Pulse 71   Ht 5\' 7"  (1.702 m)   Wt 184 lb (83.5 kg)   BMI 28.82 kg/m  Gen:  WD/WN, NAD Skin: incision C/D/I     Assessment/Plan:  Carotid stenosis Duplex shows some narrowing within the stent but the velocities would fall in the 1 to 39% range.  The right carotid artery velocities are in the 60 to 79% range at this point.  Doing well.  Continue dual antiplatelet therapy.  Plan an ultrasound in about 3 months for follow-up.  Essential hypertension blood pressure control important in reducing the progression of atherosclerotic disease. On appropriate oral medications.   Diabetes mellitus, type II ([place]) blood glucose control important in reducing the progression of atherosclerotic disease. Also, involved in wound healing. On appropriate medications.  Leotis Pain 05/07/2021, 2:30 PM   This note was created with Dragon medical transcription system.  Any errors from dictation are unintentional.

## 2021-08-06 ENCOUNTER — Encounter (INDEPENDENT_AMBULATORY_CARE_PROVIDER_SITE_OTHER): Payer: Medicare Other

## 2021-08-06 ENCOUNTER — Ambulatory Visit (INDEPENDENT_AMBULATORY_CARE_PROVIDER_SITE_OTHER): Payer: Medicare Other | Admitting: Nurse Practitioner

## 2021-08-15 ENCOUNTER — Ambulatory Visit (INDEPENDENT_AMBULATORY_CARE_PROVIDER_SITE_OTHER): Payer: Medicare Other | Admitting: Vascular Surgery

## 2021-09-17 IMAGING — CT CT ANGIO NECK
2 of 7 series · 5 of 33 positions shown · IV contrast (omnipaque)
Comparison: None.

CLINICAL DATA: Asymptomatic carotid stenosis

EXAM:
CT ANGIOGRAPHY NECK
TECHNIQUE: Multidetector CT imaging of the neck was performed using the
standard protocol during bolus administration of intravenous
contrast. Multiplanar CT image reconstructions and MIPs were
obtained to evaluate the vascular anatomy. Carotid stenosis
measurements (when applicable) are obtained utilizing NASCET
criteria, using the distal internal carotid diameter as the
denominator.
CONTRAST:  50mL OMNIPAQUE IOHEXOL 350 MG/ML SOLN

[Series 7: ax thin mips cta neck 1.00 ax · axial · 0.54mm/px · z∈[-682,-554]mm · 4 of 214 slices shown]
[im 43/214  soft-tissue]
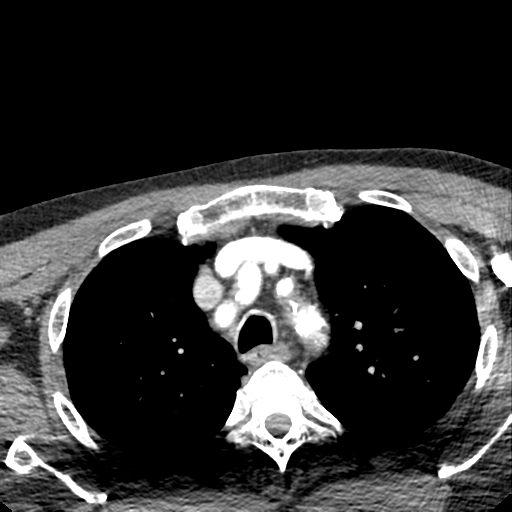
[im 86/214  bone]
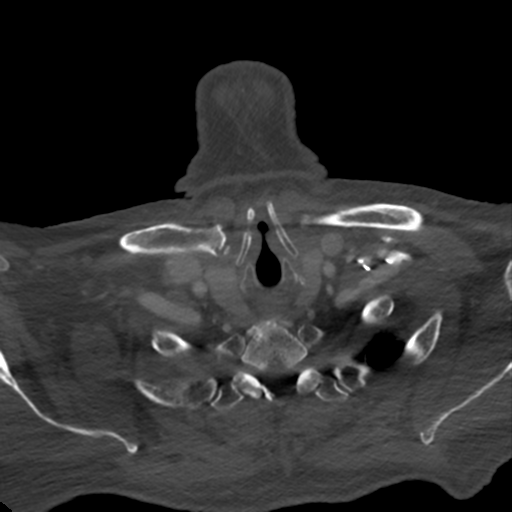
[im 128/214  soft-tissue]
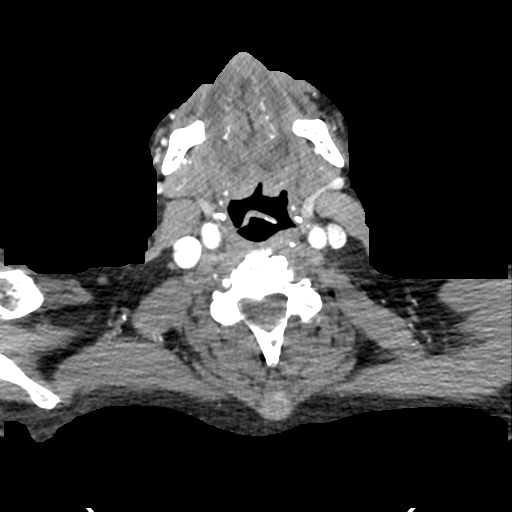
[im 171/214  bone]
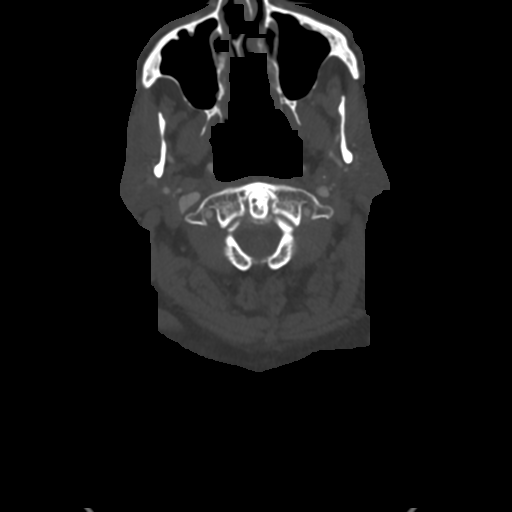

[Series 11: sag thin mips cta neck 1.00 sag · sagittal · 0.42mm/px · 1 of 277 slices shown]
[im 139/277  soft-tissue]
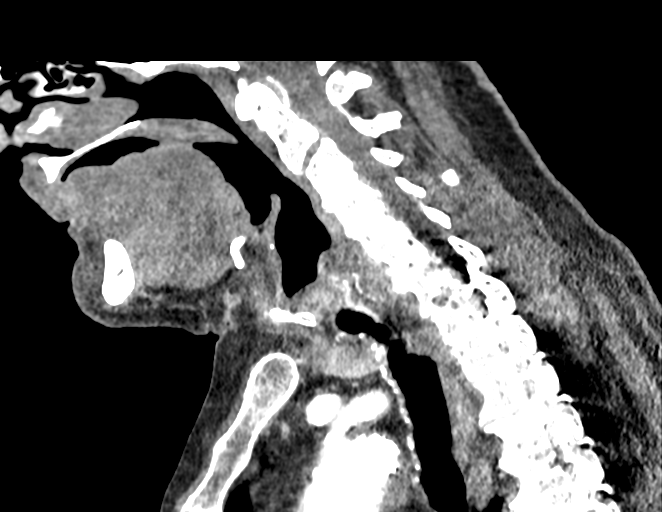

[5 of 33 positions shown; findings below may reference images not displayed]

FINDINGS: Aortic arch: Mixed plaque along the arch and at the patent great
vessel origins.

Right carotid system: Patent. Minimal calcified plaque along the
common carotid. Calcified plaque at the ICA origin without stenosis.
Subsequent mixed plaque causing approximately 65-70% stenosis.
Minimal luminal measurement difficult due to presence of artifact.

Left carotid system: Patent. Mixed plaque at the ICA origin without
stenosis. Subsequent large calcified plaque causing approximately
60-65% stenosis. Minimal luminal measurement difficult due to
presence of artifact.

Vertebral arteries: Patent and codominant.

Skeleton: Degenerative changes of the cervical spine. Canal
narrowing is greatest at C3-C4.

Other neck: Subcentimeter calcified right thyroid nodule previously
evaluated by ultrasound.

Upper chest: Mild centrilobular emphysema.
IMPRESSION: Mixed plaque along the proximal right ICA causing approximately
65-70% stenosis. Calcified plaque along the proximal left ICA
causing approximately 60-65% stenosis. Stenosis may be
underestimated or overestimated due to difficulty in minimal luminal
measurement secondary to artifact.

## 2021-10-09 IMAGING — DX DG CHEST 1V
1 series · 1 of 1 positions shown · non-contrast
Comparison: CT 06/24/2018.  Chest x-ray 06/24/2018.

CLINICAL DATA: Shortness of breath.

EXAM:
CHEST  1 VIEW

[chest ap]
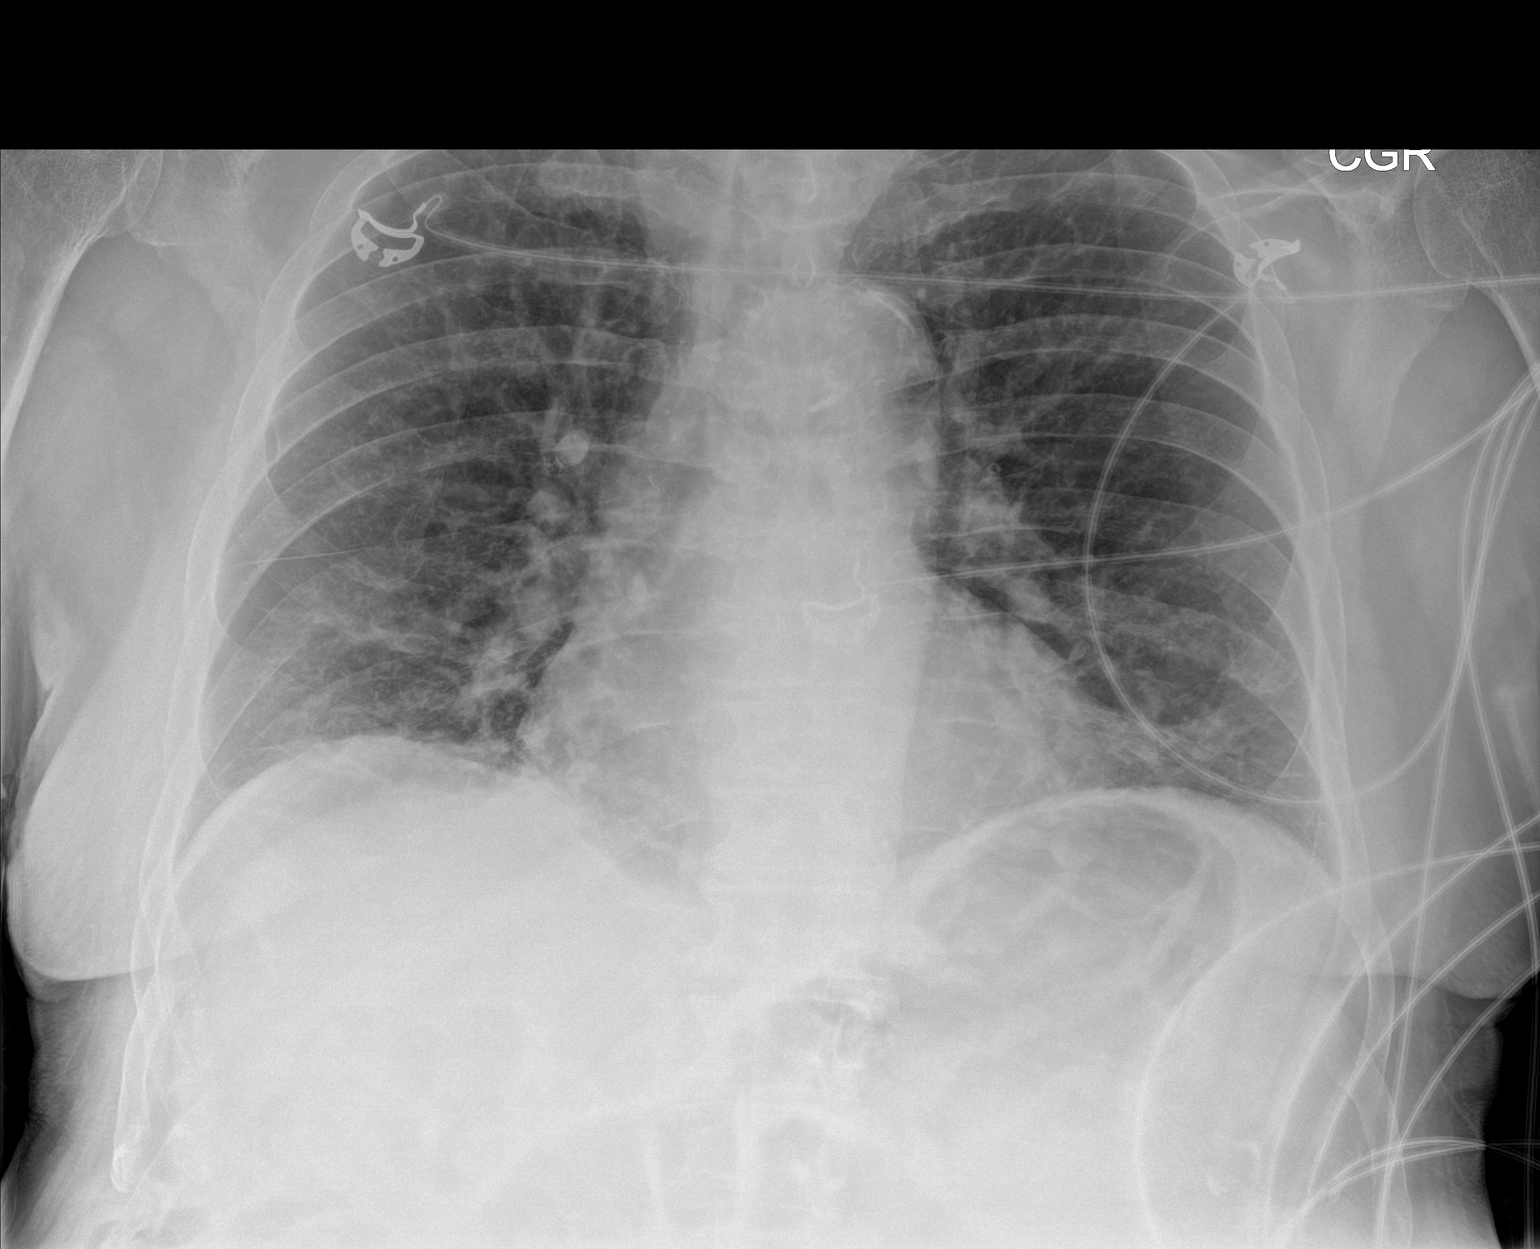

[1 of 1 positions shown; findings below may reference images not displayed]

FINDINGS: Mediastinum and hilar structures normal. Heart size normal. Low lung
volumes with mild bibasilar atelectasis/infiltrates.
IMPRESSION: Low lung volumes with mild bibasilar atelectasis/infiltrates.

## 2022-07-03 ENCOUNTER — Encounter: Payer: Self-pay | Admitting: *Deleted

## 2022-07-14 ENCOUNTER — Ambulatory Visit (INDEPENDENT_AMBULATORY_CARE_PROVIDER_SITE_OTHER): Payer: Medicare Other | Admitting: Urology

## 2022-07-14 ENCOUNTER — Encounter: Payer: Self-pay | Admitting: Urology

## 2022-07-14 VITALS — BP 145/55 | HR 48 | Ht 67.0 in

## 2022-07-14 DIAGNOSIS — N401 Enlarged prostate with lower urinary tract symptoms: Secondary | ICD-10-CM

## 2022-07-14 DIAGNOSIS — R399 Unspecified symptoms and signs involving the genitourinary system: Secondary | ICD-10-CM

## 2022-07-14 DIAGNOSIS — R350 Frequency of micturition: Secondary | ICD-10-CM | POA: Diagnosis not present

## 2022-07-14 DIAGNOSIS — N400 Enlarged prostate without lower urinary tract symptoms: Secondary | ICD-10-CM

## 2022-07-14 DIAGNOSIS — N3941 Urge incontinence: Secondary | ICD-10-CM | POA: Diagnosis not present

## 2022-07-14 LAB — URINALYSIS, COMPLETE
Bilirubin, UA: NEGATIVE
Glucose, UA: NEGATIVE
Ketones, UA: NEGATIVE
Leukocytes,UA: NEGATIVE
Nitrite, UA: NEGATIVE
Protein,UA: NEGATIVE
RBC, UA: NEGATIVE
Specific Gravity, UA: <1.005 — ABNORMAL LOW (ref 1.005–1.030)
Urobilinogen, Ur: 0.2 mg/dL (ref 0.2–1.0)
pH, UA: 5.5 (ref 5.0–7.5)

## 2022-07-14 LAB — MICROSCOPIC EXAMINATION: Bacteria, UA: NONE SEEN

## 2022-07-14 MED ORDER — TAMSULOSIN HCL 0.4 MG PO CAPS: 0.4000 mg | ORAL_CAPSULE | Freq: Every day | ORAL | 11 refills | Status: DC

## 2022-07-14 NOTE — Patient Instructions (Signed)
Benign Prostatic Hyperplasia  Benign prostatic hyperplasia (BPH) is an enlarged prostate gland that is caused by the normal aging process. The prostate may get bigger as a man gets older. The condition is not caused by cancer. The prostate is a walnut-sized gland that is involved in the production of semen. It is located in front of the rectum and below the bladder. The bladder stores urine. The urethra carries stored urine out of the body. An enlarged prostate can press on the urethra. This can make it harder to pass urine. The buildup of urine in the bladder can cause infection. Back pressure and infection may progress to bladder damage and kidney (renal) failure. What are the causes? This condition is part of the normal aging process. However, not all men develop problems from this condition. If the prostate enlarges away from the urethra, urine flow will not be blocked. If it enlarges toward the urethra and compresses it, there will be problems passing urine. What increases the risk? This condition is more likely to develop in men older than 50 years. What are the signs or symptoms? Symptoms of this condition include: Getting up often during the night to urinate. Needing to urinate frequently during the day. Difficulty starting urine flow. Decrease in size and strength of your urine stream. Leaking (dribbling) after urinating. Inability to pass urine. This needs immediate treatment. Inability to completely empty your bladder. Pain when you pass urine. This is more common if there is also an infection. Urinary tract infection (UTI). How is this diagnosed? This condition is diagnosed based on your medical history, a physical exam, and your symptoms. Tests will also be done, such as: A post-void bladder scan. This measures any amount of urine that may remain in your bladder after you finish urinating. A digital rectal exam. In a rectal exam, your health care provider checks your prostate by  putting a lubricated, gloved finger into your rectum to feel the back of your prostate gland. This exam detects the size of your gland and any abnormal lumps or growths. An exam of your urine (urinalysis). A prostate specific antigen (PSA) screening. This is a blood test used to screen for prostate cancer. An ultrasound. This test uses sound waves to electronically produce a picture of your prostate gland. Your health care provider may refer you to a specialist in kidney and prostate diseases (urologist). How is this treated? Once symptoms begin, your health care provider will monitor your condition (active surveillance or watchful waiting). Treatment for this condition will depend on the severity of your condition. Treatment may include: Observation and yearly exams. This may be the only treatment needed if your condition and symptoms are mild. Medicines to relieve your symptoms, including: Medicines to shrink the prostate. Medicines to relax the muscle of the prostate. Surgery in severe cases. Surgery may include: Prostatectomy. In this procedure, the prostate tissue is removed completely through an open incision or with a laparoscope or robotics. Transurethral resection of the prostate (TURP). In this procedure, a tool is inserted through the opening at the tip of the penis (urethra). It is used to cut away tissue of the inner core of the prostate. The pieces are removed through the same opening of the penis. This removes the blockage. Transurethral incision (TUIP). In this procedure, small cuts are made in the prostate. This lessens the prostate's pressure on the urethra. Transurethral microwave thermotherapy (TUMT). This procedure uses microwaves to create heat. The heat destroys and removes a small amount of   prostate tissue. Transurethral needle ablation (TUNA). This procedure uses radio frequencies to destroy and remove a small amount of prostate tissue. Interstitial laser coagulation ([place]).  This procedure uses a laser to destroy and remove a small amount of prostate tissue. Transurethral electrovaporization (TUVP). This procedure uses electrodes to destroy and remove a small amount of prostate tissue. Prostatic urethral lift. This procedure inserts an implant to push the lobes of the prostate away from the urethra. Follow these instructions at home: Take over-the-counter and prescription medicines only as told by your health care provider. Monitor your symptoms for any changes. Contact your health care provider with any changes. Avoid drinking large amounts of liquid before going to bed or out in public. Avoid or reduce how much caffeine or alcohol you drink. Give yourself time when you urinate. Keep all follow-up visits. This is important. Contact a health care provider if: You have unexplained back pain. Your symptoms do not get better with treatment. You develop side effects from the medicine you are taking. Your urine becomes very dark or has a bad smell. Your lower abdomen becomes distended and you have trouble passing urine. Get help right away if: You have a fever or chills. You suddenly cannot urinate. You feel light-headed or very dizzy, or you faint. There are large amounts of blood or clots in your urine. Your urinary problems become hard to manage. You develop moderate to severe low back or flank pain. The flank is the side of your body between the ribs and the hip. These symptoms may be an emergency. Get help right away. Call 911. Do not wait to see if the symptoms will go away. Do not drive yourself to the hospital. Summary Benign prostatic hyperplasia (BPH) is an enlarged prostate that is caused by the normal aging process. It is not caused by cancer. An enlarged prostate can press on the urethra. This can make it hard to pass urine. This condition is more likely to develop in men older than 50 years. Get help right away if you suddenly cannot urinate. This  information is not intended to replace advice given to you by your health care provider. Make sure you discuss any questions you have with your health care provider. Document Revised: 06/05/2021 Document Reviewed: 06/05/2021 Elsevier Patient Education  [place].  Overactive Bladder, Adult  Overactive bladder is a condition in which a person has a sudden and frequent need to urinate. A person might also leak urine if he or she cannot get to the bathroom fast enough (urinary incontinence). Sometimes, symptoms can interfere with work or social activities. What are the causes? Overactive bladder is associated with poor nerve signals between your bladder and your brain. Your bladder may get the signal to empty before it is full. You may also have very sensitive muscles that make your bladder squeeze too soon. This condition may also be caused by other factors, such as: Medical conditions: Urinary tract infection. Infection of nearby tissues. Prostate enlargement. Bladder stones, inflammation, or tumors. Diabetes. Muscle or nerve weakness, especially from these conditions: A spinal cord injury. Stroke. Multiple sclerosis. Parkinson's disease. Other causes: Surgery on the uterus or urethra. Drinking too much caffeine or alcohol. Certain medicines, especially those that eliminate extra fluid in the body (diuretics). Constipation. What increases the risk? You may be at greater risk for overactive bladder if you: Are an older adult. Smoke. Are going through menopause. Have prostate problems. Have a neurological disease, such as stroke, dementia, Parkinson's disease, or  multiple sclerosis (MS). Eat or drink alcohol, spicy food, caffeine, and other things that irritate the bladder. Are overweight or obese. What are the signs or symptoms? Symptoms of this condition include a sudden, strong urge to urinate. Other symptoms include: Leaking urine. Urinating 8 or more times a  day. Waking up to urinate 2 or more times overnight. How is this diagnosed? This condition may be diagnosed based on: Your symptoms and medical history. A physical exam. Blood or urine tests to check for possible causes, such as infection. You may also need to see a health care provider who specializes in urinary tract problems. This is called a urologist. How is this treated? Treatment for overactive bladder depends on the cause of your condition and whether it is mild or severe. Treatment may include: Bladder training, such as: Learning to control the urge to urinate by following a schedule to urinate at regular intervals. Doing Kegel exercises to strengthen the pelvic floor muscles that support your bladder. Special devices, such as: Biofeedback. This uses sensors to help you become aware of your body's signals. Electrical stimulation. This uses electrodes placed inside the body (implanted) or outside the body. These electrodes send gentle pulses of electricity to strengthen the nerves or muscles that control the bladder. Women may use a plastic device, called a pessary, that fits into the vagina and supports the bladder. Medicines, such as: Antibiotics to treat bladder infection. Antispasmodics to stop the bladder from releasing urine at the wrong time. Tricyclic antidepressants to relax bladder muscles. Injections of botulinum toxin type A directly into the bladder tissue to relax bladder muscles. Surgery, such as: A device may be implanted to help manage the nerve signals that control urination. An electrode may be implanted to stimulate electrical signals in the bladder. A procedure may be done to change the shape of the bladder. This is done only in very severe cases. Follow these instructions at home: Eating and drinking  Make diet or lifestyle changes recommended by your health care provider. These may include: Drinking fluids throughout the day and not only with  meals. Cutting down on caffeine or alcohol. Eating a healthy and balanced diet to prevent constipation. This may include: Choosing foods that are high in fiber, such as beans, whole grains, and fresh fruits and vegetables. Limiting foods that are high in fat and processed sugars, such as fried and sweet foods. Lifestyle  Lose weight if needed. Do not use any products that contain nicotine or tobacco. These include cigarettes, chewing tobacco, and vaping devices, such as e-cigarettes. If you need help quitting, ask your health care provider. General instructions Take over-the-counter and prescription medicines only as told by your health care provider. If you were prescribed an antibiotic medicine, take it as told by your health care provider. Do not stop taking the antibiotic even if you start to feel better. Use any implants or pessary as told by your health care provider. If needed, wear pads to absorb urine leakage. Keep a log to track how much and when you drink, and when you need to urinate. This will help your health care provider monitor your condition. Keep all follow-up visits. This is important. Contact a health care provider if: You have a fever or chills. Your symptoms do not get better with treatment. Your pain and discomfort get worse. You have more frequent urges to urinate. Get help right away if: You are not able to control your bladder. Summary Overactive bladder refers to a condition in  which a person has a sudden and frequent need to urinate. Several conditions may lead to an overactive bladder. Treatment for overactive bladder depends on the cause and severity of your condition. Making lifestyle changes, doing Kegel exercises, keeping a log, and taking medicines can help with this condition. This information is not intended to replace advice given to you by your health care provider. Make sure you discuss any questions you have with your health care  provider. Document Revised: 08/06/2020 Document Reviewed: 08/06/2020 Elsevier Patient Education  [place].

## 2022-07-14 NOTE — Progress Notes (Signed)
07/14/22 12:57 PM   Francisco Gibbs 1933-07-06 [id number]  CC: Lower urinary tract symptoms-urgency/frequency/urge incontinence  HPI: I saw Francisco Gibbs and his niece today for the above issues.  He is very hard of hearing and she helps provide much of the history.  He is a comorbid 88 year old male who reports at least a few years of worsening urinary symptoms with frequency, urgency, urge incontinence, nocturia 4-6 times overnight.  He will multiple depends during the day.  He denies any gross hematuria.  He was having some dysuria a few months ago and was report newly diagnosed with a UTI, but that resolved after antibiotics.  Those records are not available.  He has been on low-dose terazosin long-term, unclear when this was started.  IPSS score today is 29, with quality of life terrible.  Urinalysis today is completely benign, PVR mildly elevated at 159 mL.   PMH: Past Medical History:  Diagnosis Date   Aortic stenosis    Basal cell carcinoma 03/16/2020   R tip of nose, R preauricular area, Moh's 06/06/2020   COPD (chronic obstructive pulmonary disease) (HCC)    Diabetes mellitus without complication ([place])    Pt takes Metformin.   GERD (gastroesophageal reflux disease)    HLD (hyperlipidemia)    Hypertension     Surgical History: Past Surgical History:  Procedure Laterality Date   CAROTID PTA/STENT INTERVENTION Left 02/25/2021   Procedure: CAROTID PTA/STENT INTERVENTION;  Surgeon: Algernon Huxley, MD;  Location: [facility];  Service: Cardiovascular;  Laterality: Left;   HYDROCELE EXCISION / REPAIR     INGUINAL HERNIA REPAIR     KNEE SURGERY     LEFT HEART CATH AND CORONARY ANGIOGRAPHY Left 01/30/2021   Procedure: LEFT HEART CATH AND CORONARY ANGIOGRAPHY;  Surgeon: Yolonda Kida, MD;  Location: [facility];  Service: Cardiovascular;  Laterality: Left;    Family History: Family History  Problem Relation Age of Onset   Diabetes Sister    Diabetes Paternal Aunt      Social History:  reports that he has quit smoking. His smoking use included cigarettes. He has a 70.00 pack-year smoking history. He has been exposed to tobacco smoke. He has never used smokeless tobacco. He reports that he does not currently use alcohol. He reports that he does not use drugs.  Physical Exam: BP (!) 145/55   Pulse (!) 48   Ht '5\' 7"'$  (1.702 m)   BMI 28.82 kg/m    Constitutional:  Alert and oriented, No acute distress. Cardiovascular: No clubbing, cyanosis, or edema. Respiratory: Normal respiratory effort, no increased work of breathing. GI: Abdomen is soft, nontender, nondistended, no abdominal masses GU: Widely patent meatus, no lesions DRE: 30 g gland, smooth, no nodules or masses  Assessment & Plan:   88 year old male with long history of bothersome urinary symptoms with urgency, frequency, and urge incontinence of unclear etiology.  Urinalysis today benign and PVR mildly elevated at 159 mL.  We reviewed possible etiologies including OAB, BPH, stricture, and less likely malignancy.  I recommended starting with a trial of Flomax with his mildly elevated PVR, but certainly may need to consider an OAB medication in the future as his symptoms seem primarily storage related.  I am hesitant to start with a OAB medication with his age and risk of retention with mildly elevated PVR.  Trial of Flomax 0.4 mg nightly 1 month follow-up repeat PVR, if PVR normal consider OAB medication versus cystoscopy   Aaron Edelman  Diamantina Providence, MD 07/14/2022  [facility] [address] [place], [place] [address] [phone]

## 2022-08-13 ENCOUNTER — Encounter: Payer: Self-pay | Admitting: Urology

## 2022-08-13 ENCOUNTER — Ambulatory Visit (INDEPENDENT_AMBULATORY_CARE_PROVIDER_SITE_OTHER): Payer: Medicare Other | Admitting: Urology

## 2022-08-13 VITALS — BP 159/67 | HR 64 | Ht 67.0 in | Wt 160.0 lb

## 2022-08-13 DIAGNOSIS — N401 Enlarged prostate with lower urinary tract symptoms: Secondary | ICD-10-CM

## 2022-08-13 DIAGNOSIS — N3281 Overactive bladder: Secondary | ICD-10-CM | POA: Diagnosis not present

## 2022-08-13 DIAGNOSIS — N400 Enlarged prostate without lower urinary tract symptoms: Secondary | ICD-10-CM

## 2022-08-13 LAB — BLADDER SCAN AMB NON-IMAGING

## 2022-08-13 NOTE — Progress Notes (Signed)
   08/13/2022 2:25 PM   Francisco Gibbs [birth year] [id number]  Reason for visit: Follow up lower urinary tract symptoms-urgency/frequency/urge incontinence  HPI: Comorbid and frail 88 year old male with at least a few years of worsening urinary symptoms with frequency, urgency, urge incontinence, nocturia 4-6 times overnight.  He uses multiple depends during the day.  No gross hematuria.  Reported history of UTI but those records not available.  Previously was on low-dose terazosin.  Urinalysis at our last visit in August 2023 was benign, and PVR was borderline at 112m.  He opted for a trial of Flomax to replace the terazosin, as we were hesitant to try an OAB medication with his risk of retention.  He does think the Flomax has improved the urination.  He continues to have some urgency and frequency.  PVR remains borderline elevated today at 1670m  His daughter has been working with him to cut back on bladder irritants including caffeine, soda, diet drinks, tea, which also has helped.  We discussed options including continuing Flomax and avoiding bladder irritants, trial of an OAB medication(though very hesitant to consider an OAB medication with his borderline PVRs and baseline frailty), or further investigation with cystoscopy and TRUS.  Risk and benefits discussed extensively.  Patient and family would like to continue Flomax and working on behavioral strategies, return precautions discussed at length.  Continue Flomax RTC 3 months PVR symptom check, consider cystoscopy/TRUS if worsening symptoms  BrBilley CoMD  [place] [phone]

## 2022-09-29 ENCOUNTER — Encounter (INDEPENDENT_AMBULATORY_CARE_PROVIDER_SITE_OTHER): Payer: Self-pay

## 2022-11-13 ENCOUNTER — Encounter: Payer: Self-pay | Admitting: Urology

## 2022-11-13 ENCOUNTER — Ambulatory Visit (INDEPENDENT_AMBULATORY_CARE_PROVIDER_SITE_OTHER): Payer: Medicare Other | Admitting: Urology

## 2022-11-13 VITALS — BP 156/67 | HR 69 | Ht 67.0 in | Wt 160.0 lb

## 2022-11-13 DIAGNOSIS — R399 Unspecified symptoms and signs involving the genitourinary system: Secondary | ICD-10-CM

## 2022-11-13 DIAGNOSIS — R350 Frequency of micturition: Secondary | ICD-10-CM

## 2022-11-13 DIAGNOSIS — N3281 Overactive bladder: Secondary | ICD-10-CM

## 2022-11-13 DIAGNOSIS — N400 Enlarged prostate without lower urinary tract symptoms: Secondary | ICD-10-CM

## 2022-11-13 DIAGNOSIS — N3941 Urge incontinence: Secondary | ICD-10-CM | POA: Diagnosis not present

## 2022-11-13 DIAGNOSIS — R351 Nocturia: Secondary | ICD-10-CM

## 2022-11-13 LAB — BLADDER SCAN AMB NON-IMAGING

## 2022-11-13 MED ORDER — TAMSULOSIN HCL 0.4 MG PO CAPS: 0.4000 mg | ORAL_CAPSULE | Freq: Every day | ORAL | 11 refills | Status: DC

## 2022-11-13 NOTE — Progress Notes (Signed)
   11/13/2022 11:05 AM   Francisco Gibbs [birth year] [id number]  Reason for visit: Follow up lower urinary tract symptoms-urgency/frequency/urge incontinence  HPI: Comorbid and frail 88 year old male who presented in August 2023 with at least a few years of worsening urinary symptoms with frequency, urgency, urge incontinence, nocturia 4-6 times overnight.  He was using multiple depends during the day.  No gross hematuria.  Reported history of UTI but those records not available.  Previously was on low-dose terazosin.  Urinalysis have all been benign, and PVR was previously borderline at 116m.    He opted for trial of Flomax to replace the terazosin, and we opted to avoid an OAB medication with his risk of retention and borderline PVRs.  They have also been working on behavioral strategies and avoiding bladder irritants. I had previously offered cystoscopy/TRUS for further evaluation but they deferred.  He reports significant improvement on the Flomax.  He is here with his sister today who provides most of the history, and he definitely has a bit of confusion and likely dementia.  It sounds like he is doing well overnight with no significant urinary problems and may be getting up once sometimes twice which is improved significantly from prior.  He still has some mild leakage during the day, but this again is improved.  He has worked on cutting back on bladder irritants.  PVR today is normal at 65 mL.  With improved symptoms would avoid OAB medication with his age, frailty, history of elevated PVRs.  Continue Flomax RTC 1 year PVR   BBilley Co MD  [facility] [place] [phone]

## 2023-11-02 ENCOUNTER — Emergency Department: Payer: Medicare Other

## 2023-11-02 ENCOUNTER — Inpatient Hospital Stay
Admission: EM | Admit: 2023-11-02 | Discharge: 2023-11-10 | DRG: 871 | Disposition: A | Discharge status: Home Health | Payer: Medicare Other | Attending: Internal Medicine | Admitting: Internal Medicine

## 2023-11-02 ENCOUNTER — Other Ambulatory Visit: Payer: Self-pay

## 2023-11-02 DIAGNOSIS — I350 Nonrheumatic aortic (valve) stenosis: Secondary | ICD-10-CM | POA: Diagnosis present

## 2023-11-02 DIAGNOSIS — D631 Anemia in chronic kidney disease: Secondary | ICD-10-CM | POA: Diagnosis present

## 2023-11-02 DIAGNOSIS — J189 Pneumonia, unspecified organism: Secondary | ICD-10-CM | POA: Diagnosis present

## 2023-11-02 DIAGNOSIS — Z7984 Long term (current) use of oral hypoglycemic drugs: Secondary | ICD-10-CM

## 2023-11-02 DIAGNOSIS — J441 Chronic obstructive pulmonary disease with (acute) exacerbation: Secondary | ICD-10-CM | POA: Diagnosis present

## 2023-11-02 DIAGNOSIS — F17210 Nicotine dependence, cigarettes, uncomplicated: Secondary | ICD-10-CM | POA: Diagnosis present

## 2023-11-02 DIAGNOSIS — Z888 Allergy status to other drugs, medicaments and biological substances status: Secondary | ICD-10-CM

## 2023-11-02 DIAGNOSIS — E1122 Type 2 diabetes mellitus with diabetic chronic kidney disease: Secondary | ICD-10-CM | POA: Diagnosis present

## 2023-11-02 DIAGNOSIS — N400 Enlarged prostate without lower urinary tract symptoms: Secondary | ICD-10-CM | POA: Insufficient documentation

## 2023-11-02 DIAGNOSIS — E1142 Type 2 diabetes mellitus with diabetic polyneuropathy: Secondary | ICD-10-CM

## 2023-11-02 DIAGNOSIS — H9190 Unspecified hearing loss, unspecified ear: Secondary | ICD-10-CM | POA: Diagnosis present

## 2023-11-02 DIAGNOSIS — W19XXXA Unspecified fall, initial encounter: Secondary | ICD-10-CM

## 2023-11-02 DIAGNOSIS — R509 Fever, unspecified: Secondary | ICD-10-CM | POA: Diagnosis not present

## 2023-11-02 DIAGNOSIS — Z7951 Long term (current) use of inhaled steroids: Secondary | ICD-10-CM

## 2023-11-02 DIAGNOSIS — K219 Gastro-esophageal reflux disease without esophagitis: Secondary | ICD-10-CM | POA: Diagnosis present

## 2023-11-02 DIAGNOSIS — I10 Essential (primary) hypertension: Secondary | ICD-10-CM | POA: Diagnosis present

## 2023-11-02 DIAGNOSIS — J9621 Acute and chronic respiratory failure with hypoxia: Secondary | ICD-10-CM | POA: Diagnosis present

## 2023-11-02 DIAGNOSIS — Z23 Encounter for immunization: Secondary | ICD-10-CM

## 2023-11-02 DIAGNOSIS — E875 Hyperkalemia: Secondary | ICD-10-CM | POA: Diagnosis present

## 2023-11-02 DIAGNOSIS — Z1152 Encounter for screening for COVID-19: Secondary | ICD-10-CM

## 2023-11-02 DIAGNOSIS — Z79899 Other long term (current) drug therapy: Secondary | ICD-10-CM

## 2023-11-02 DIAGNOSIS — I129 Hypertensive chronic kidney disease with stage 1 through stage 4 chronic kidney disease, or unspecified chronic kidney disease: Secondary | ICD-10-CM | POA: Diagnosis present

## 2023-11-02 DIAGNOSIS — R55 Syncope and collapse: Secondary | ICD-10-CM | POA: Diagnosis present

## 2023-11-02 DIAGNOSIS — Z833 Family history of diabetes mellitus: Secondary | ICD-10-CM

## 2023-11-02 DIAGNOSIS — A419 Sepsis, unspecified organism: Principal | ICD-10-CM

## 2023-11-02 DIAGNOSIS — W01198A Fall on same level from slipping, tripping and stumbling with subsequent striking against other object, initial encounter: Secondary | ICD-10-CM | POA: Diagnosis present

## 2023-11-02 DIAGNOSIS — I440 Atrioventricular block, first degree: Secondary | ICD-10-CM | POA: Diagnosis present

## 2023-11-02 DIAGNOSIS — J449 Chronic obstructive pulmonary disease, unspecified: Secondary | ICD-10-CM | POA: Diagnosis present

## 2023-11-02 DIAGNOSIS — J440 Chronic obstructive pulmonary disease with acute lower respiratory infection: Secondary | ICD-10-CM | POA: Diagnosis present

## 2023-11-02 DIAGNOSIS — J9601 Acute respiratory failure with hypoxia: Secondary | ICD-10-CM

## 2023-11-02 DIAGNOSIS — E785 Hyperlipidemia, unspecified: Secondary | ICD-10-CM

## 2023-11-02 DIAGNOSIS — Y9301 Activity, walking, marching and hiking: Secondary | ICD-10-CM | POA: Diagnosis present

## 2023-11-02 DIAGNOSIS — Z882 Allergy status to sulfonamides status: Secondary | ICD-10-CM

## 2023-11-02 DIAGNOSIS — N1832 Chronic kidney disease, stage 3b: Secondary | ICD-10-CM | POA: Diagnosis present

## 2023-11-02 DIAGNOSIS — Z85828 Personal history of other malignant neoplasm of skin: Secondary | ICD-10-CM

## 2023-11-02 DIAGNOSIS — Z7982 Long term (current) use of aspirin: Secondary | ICD-10-CM

## 2023-11-02 DIAGNOSIS — F32A Depression, unspecified: Secondary | ICD-10-CM | POA: Diagnosis present

## 2023-11-02 DIAGNOSIS — Z880 Allergy status to penicillin: Secondary | ICD-10-CM

## 2023-11-02 DIAGNOSIS — Y92009 Unspecified place in unspecified non-institutional (private) residence as the place of occurrence of the external cause: Secondary | ICD-10-CM

## 2023-11-02 LAB — CBC WITH DIFFERENTIAL/PLATELET
Abs Immature Granulocytes: 0.21 10*3/uL — ABNORMAL HIGH (ref 0.00–0.07)
Basophils Absolute: 0.1 10*3/uL (ref 0.0–0.1)
Basophils Relative: 0 %
Eosinophils Absolute: 0 10*3/uL (ref 0.0–0.5)
Eosinophils Relative: 0 %
HCT: 29.1 % — ABNORMAL LOW (ref 39.0–52.0)
Hemoglobin: 9.4 g/dL — ABNORMAL LOW (ref 13.0–17.0)
Immature Granulocytes: 1 %
Lymphocytes Relative: 3 %
Lymphs Abs: 0.7 10*3/uL (ref 0.7–4.0)
MCH: 30.7 pg (ref 26.0–34.0)
MCHC: 32.3 g/dL (ref 30.0–36.0)
MCV: 95.1 fL (ref 80.0–100.0)
Monocytes Absolute: 2.1 10*3/uL — ABNORMAL HIGH (ref 0.1–1.0)
Monocytes Relative: 8 %
Neutro Abs: 22.4 10*3/uL — ABNORMAL HIGH (ref 1.7–7.7)
Neutrophils Relative %: 88 %
Platelets: 195 10*3/uL (ref 150–400)
RBC: 3.06 MIL/uL — ABNORMAL LOW (ref 4.22–5.81)
RDW: 13.7 % (ref 11.5–15.5)
Smear Review: NORMAL
WBC: 25.5 10*3/uL — ABNORMAL HIGH (ref 4.0–10.5)
nRBC: 0 % (ref 0.0–0.2)

## 2023-11-02 LAB — RESP PANEL BY RT-PCR (RSV, FLU A&B, COVID)  RVPGX2
Influenza A by PCR: NEGATIVE
Influenza B by PCR: NEGATIVE
Resp Syncytial Virus by PCR: NEGATIVE
SARS Coronavirus 2 by RT PCR: NEGATIVE

## 2023-11-02 LAB — COMPREHENSIVE METABOLIC PANEL
ALT: 16 U/L (ref 0–44)
AST: 23 U/L (ref 15–41)
Albumin: 3.9 g/dL (ref 3.5–5.0)
Alkaline Phosphatase: 38 U/L (ref 38–126)
Anion gap: 9 (ref 5–15)
BUN: 39 mg/dL — ABNORMAL HIGH (ref 8–23)
CO2: 23 mmol/L (ref 22–32)
Calcium: 9.2 mg/dL (ref 8.9–10.3)
Chloride: 105 mmol/L (ref 98–111)
Creatinine, Ser: 1.62 mg/dL — ABNORMAL HIGH (ref 0.61–1.24)
GFR, Estimated: 41 mL/min — ABNORMAL LOW (ref >60)
Glucose, Bld: 91 mg/dL (ref 70–99)
Potassium: 4.8 mmol/L (ref 3.5–5.1)
Sodium: 137 mmol/L (ref 135–145)
Total Bilirubin: 0.6 mg/dL (ref <1.2)
Total Protein: 6.2 g/dL — ABNORMAL LOW (ref 6.5–8.1)

## 2023-11-02 LAB — PROTIME-INR
INR: 1.2 (ref 0.8–1.2)
Prothrombin Time: 15.4 s — ABNORMAL HIGH (ref 11.4–15.2)

## 2023-11-02 LAB — LACTIC ACID, PLASMA
Lactic Acid, Venous: 1.4 mmol/L (ref 0.5–1.9)
Lactic Acid, Venous: 1.4 mmol/L (ref 0.5–1.9)

## 2023-11-02 LAB — TROPONIN I (HIGH SENSITIVITY): Troponin I (High Sensitivity): 29 ng/L — ABNORMAL HIGH (ref <18)

## 2023-11-02 LAB — APTT: aPTT: 28 s (ref 24–36)

## 2023-11-02 MED ORDER — LACTATED RINGERS IV BOLUS
500.0000 mL | Freq: Once | INTRAVENOUS | Status: AC
  Administered 2023-11-02: 500 mL via INTRAVENOUS

## 2023-11-02 MED ORDER — SODIUM CHLORIDE 0.9 % IV SOLN
2.0000 g | Freq: Once | INTRAVENOUS | Status: AC
  Administered 2023-11-02: 2 g via INTRAVENOUS
  Filled 2023-11-02: qty 12.5

## 2023-11-02 MED ORDER — LACTATED RINGERS IV BOLUS
1000.0000 mL | Freq: Once | INTRAVENOUS | Status: AC
  Administered 2023-11-02: 1000 mL via INTRAVENOUS

## 2023-11-02 MED ORDER — VANCOMYCIN HCL IN DEXTROSE 1-5 GM/200ML-% IV SOLN: 1000.0000 mg | Freq: Once | INTRAVENOUS | Status: DC

## 2023-11-02 MED ORDER — METRONIDAZOLE 500 MG/100ML IV SOLN
500.0000 mg | Freq: Once | INTRAVENOUS | Status: AC
  Administered 2023-11-02: 500 mg via INTRAVENOUS
  Filled 2023-11-02: qty 100

## 2023-11-02 MED ORDER — VANCOMYCIN HCL 1750 MG/350ML IV SOLN
1750.0000 mg | INTRAVENOUS | Status: AC
  Administered 2023-11-03: 1750 mg via INTRAVENOUS
  Filled 2023-11-02: qty 350

## 2023-11-02 MED ORDER — ACETAMINOPHEN 500 MG PO TABS
1000.0000 mg | ORAL_TABLET | Freq: Once | ORAL | Status: AC
  Administered 2023-11-02: 1000 mg via ORAL
  Filled 2023-11-02: qty 2

## 2023-11-02 MED ORDER — ONDANSETRON HCL 4 MG/2ML IJ SOLN
4.0000 mg | Freq: Once | INTRAMUSCULAR | Status: AC
  Administered 2023-11-02: 4 mg via INTRAVENOUS
  Filled 2023-11-02: qty 2

## 2023-11-02 NOTE — ED Provider Notes (Incomplete)
Care of this patient assumed from prior physician at 2300 pending radiology studies, anticipated admission. Please see prior physician note for further details.  Briefly this is an 89 year old male who presented after a fall in the setting of weakness, found to be febrile, borderline tachycardic on presentation.  X-Pressley Barsky awaiting formal read, but concerning for right-sided pneumonia.  Labs with leukocytosis, meet sepsis bacteria.  Ordered for antibiotics and fluids by prior provider.  CT head and C-spine pending in the setting of trauma.  Signed out to me pending anticipated admission after completion of imaging.  CT head and C-spine without acute traumatic injuries.  X-Reshma Hoey was interpreted by radiology as concerning for right upper lobe pneumonia.  Patient reevaluated and updated on results of workup.  Sepsis reassessment performed.  He is agreeable with plan for admission.  Will reach out to hospitalist team.  Case reviewed with Dr. Arville Care who will evaluate the patient for anticipated admission.  Shortly after admission, I was notified by RN that patient had SBP in the 80s to 90s, repeat BP 106/48 when I went to reevaluate the patient.  Hospitalist team updated, continue plan for admission.   Trinna Post, MD 11/03/23 0347    Trinna Post, MD 11/03/23 4259

## 2023-11-02 NOTE — ED Triage Notes (Addendum)
BIBEMS, coming from home. Pt fell @home  and was on floor for 2 hours. Pt had vomited after fall. Pt did hit head. Denies blood thinners. 102F oral. 88-90% on RA, 2L [place] 95%. VSS otherwise

## 2023-11-02 NOTE — Sepsis Progress Note (Signed)
Following for sepsis monitoring ?

## 2023-11-02 NOTE — Progress Notes (Signed)
ED Pharmacy Antibiotic Sign Off An antibiotic consult was received from an ED provider for cefepime and vancomycin per pharmacy dosing for sepsis. A chart review was completed to assess appropriateness.   The following one time order(s) were placed:  Vancomycin 1750 mg IV x1 Cefepime 2 g IV x1  Further antibiotic and/or antibiotic pharmacy consults should be ordered by the admitting provider if indicated.   Thank you for allowing pharmacy to be a part of this patient's care.   Rockwell Alexandria, [facility]  Clinical Pharmacist 11/02/23 9:30 PM

## 2023-11-02 NOTE — ED Provider Notes (Signed)
[facility] Provider Note    Event Date/Time   First MD Initiated Contact with Patient 11/02/23 2112     (approximate)   History   Chief Complaint Fall   HPI  Francisco Gibbs is a 89 y.o. male with past medical history of hypertension, hyperlipidemia, diabetes, COPD, and CKD who presents to the ED following fall.  Patient reports that he was walking at home when he believes he lost his balance and fell backwards, striking his head on the wall.  He thinks he may have lost consciousness for a little while, when he woke up was unable to stand back up again.  He was on the ground for a couple of hours when his daughter came home and found him down on the ground.  He denies any areas of pain following fall, does state that he has been feeling slightly ill at home recently.  He reports feeling feverish but denies any cough, chest pain, shortness of breath, nausea, vomiting, diarrhea, or dysuria.  He is not aware of any sick contacts.     Physical Exam   Triage Vital Signs: ED Triage Vitals  Encounter Vitals Group     BP      Systolic BP Percentile      Diastolic BP Percentile      Pulse      Resp      Temp      Temp src      SpO2      Weight      Height      Head Circumference      Peak Flow      Pain Score      Pain Loc      Pain Education      Exclude from Growth Chart     Most recent vital signs: Vitals:   11/02/23 2152 11/02/23 2230  BP: (!) 113/49 (!) 104/54  Pulse:  91  Resp:  19  Temp:    SpO2:  98%    Constitutional: Alert and oriented. Eyes: Conjunctivae are normal. Head: Atraumatic. Nose: No congestion/rhinnorhea. Mouth/Throat: Mucous membranes are moist.  Neck: Supple with no meningismus.  No midline cervical spine tenderness to palpation. Cardiovascular: Normal rate, regular rhythm. Grossly normal heart sounds.  2+ radial pulses bilaterally. Respiratory: Normal respiratory effort.  No retractions. Lungs CTAB.  No chest wall  tenderness to palpation. Gastrointestinal: Soft and nontender. No distention. Musculoskeletal: No lower extremity tenderness nor edema.  No upper extremity bony tenderness to palpation. Neurologic:  Normal speech and language. No gross focal neurologic deficits are appreciated.    ED Results / Procedures / Treatments   Labs (all labs ordered are listed, but only abnormal results are displayed) Labs Reviewed  COMPREHENSIVE METABOLIC PANEL - Abnormal; Notable for the following components:      Result Value   BUN 39 (*)    Creatinine, Ser 1.62 (*)    Total Protein 6.2 (*)    GFR, Estimated 41 (*)    All other components within normal limits  CBC WITH DIFFERENTIAL/PLATELET - Abnormal; Notable for the following components:   WBC 25.5 (*)    RBC 3.06 (*)    Hemoglobin 9.4 (*)    HCT 29.1 (*)    Neutro Abs 22.4 (*)    Monocytes Absolute 2.1 (*)    Abs Immature Granulocytes 0.21 (*)    All other components within normal limits  PROTIME-INR - Abnormal; Notable for the following components:  Prothrombin Time 15.4 (*)    All other components within normal limits  TROPONIN I (HIGH SENSITIVITY) - Abnormal; Notable for the following components:   Troponin I (High Sensitivity) 29 (*)    All other components within normal limits  RESP PANEL BY RT-PCR (RSV, FLU A&B, COVID)  RVPGX2  CULTURE, BLOOD (ROUTINE X 2)  CULTURE, BLOOD (ROUTINE X 2)  LACTIC ACID, PLASMA  APTT  LACTIC ACID, PLASMA  URINALYSIS, W/ REFLEX TO CULTURE (INFECTION SUSPECTED)  TROPONIN I (HIGH SENSITIVITY)     EKG  ED ECG REPORT I, Chesley Noon, the attending physician, personally viewed and interpreted this ECG.   Date: 11/02/2023  EKG Time: 21:23  Rate: 100  Rhythm: sinus tachycardia  Axis: Normal  Intervals:first-degree A-V block   ST&T Change: None  RADIOLOGY CT head reviewed and interpreted by me with no hemorrhage or midline shift.  PROCEDURES:  Critical Care performed: Yes, see critical care  procedure note(s)  .Critical Care  Performed by: Chesley Noon, MD Authorized by: Chesley Noon, MD   Critical care provider statement:    Critical care time (minutes):  30   Critical care time was exclusive of:  Separately billable procedures and treating other patients and teaching time   Critical care was necessary to treat or prevent imminent or life-threatening deterioration of the following conditions:  Sepsis   Critical care was time spent personally by me on the following activities:  Development of treatment plan with patient or surrogate, discussions with consultants, evaluation of patient's response to treatment, examination of patient, ordering and review of laboratory studies, ordering and review of radiographic studies, ordering and performing treatments and interventions, pulse oximetry, re-evaluation of patient's condition and review of old charts   I assumed direction of critical care for this patient from another provider in my specialty: no     Care discussed with: admitting provider      MEDICATIONS ORDERED IN ED: Medications  metroNIDAZOLE (FLAGYL) IVPB 500 mg (500 mg Intravenous New Bag/Given 11/02/23 2249)  vancomycin (VANCOREADY) IVPB 1750 mg/350 mL (has no administration in time range)  lactated ringers bolus 500 mL (has no administration in time range)  ceFEPIme (MAXIPIME) 2 g in sodium chloride 0.9 % 100 mL IVPB (0 g Intravenous Stopped 11/02/23 2234)  lactated ringers bolus 1,000 mL (0 mLs Intravenous Stopped 11/02/23 2233)  acetaminophen (TYLENOL) tablet 1,000 mg (1,000 mg Oral Given 11/02/23 2142)  lactated ringers bolus 1,000 mL (1,000 mLs Intravenous New Bag/Given 11/02/23 2251)  ondansetron (ZOFRAN) injection 4 mg (4 mg Intravenous Given 11/02/23 2249)     IMPRESSION / MDM / ASSESSMENT AND PLAN / ED COURSE  I reviewed the triage vital signs and the nursing notes.                              89 y.o. male with past medical history of hypertension,  hyperlipidemia, diabetes, COPD, and CKD who presents to the ED with fall just prior to arrival where he hit his head and briefly lost consciousness, reports feeling ill and fevers before that.  Patient's presentation is most consistent with acute presentation with potential threat to life or bodily function.  Differential diagnosis includes, but is not limited to, intracranial injury, cervical spine injury, sepsis, UTI, pneumonia, dehydration, electrolyte abnormality, AKI, arrhythmia, ACS.  Patient ill-appearing but dressed, vital signs remarkable for fever, borderline tachycardia, and borderline hypotension.  He is not in any respiratory distress and maintaining  oxygen saturations at 97% on room air.  We will check CT head and cervical spine for traumatic injury, but no evidence of injury to his trunk or extremities.  With his fever and other vital signs, I am concerned for possible sepsis and we will start on broad-spectrum antibiotics, resuscitate with IV fluids.  Labs show significant leukocytosis consistent with sepsis, anemia stable compared to previous.  Renal function also stable compared to previous with no acute electrolyte abnormality, LFTs are unremarkable.  Troponin mildly elevated and we will trend, lactic acid within normal limits.  Chest x-ray and urinalysis are pending at this time along with viral testing for COVID and flu.  Patient turned over to oncoming rider pending radiology results and admission.      FINAL CLINICAL IMPRESSION(S) / ED DIAGNOSES   Final diagnoses:  Sepsis without acute organ dysfunction, due to unspecified organism [facility])  Fall, initial encounter     Rx / DC Orders   ED Discharge Orders     None        Note:  This document was prepared using Dragon voice recognition software and may include unintentional dictation errors.   Chesley Noon, MD 11/02/23 [phone]

## 2023-11-02 NOTE — Progress Notes (Signed)
CODE SEPSIS - PHARMACY COMMUNICATION  **Broad Spectrum Antibiotics should be administered within 1 hour of Sepsis diagnosis**  Time Code Sepsis Called/Page Received: 2128  Antibiotics Ordered: vancomycin and cefepime  Time of 1st antibiotic administration: 2142  Additional action taken by pharmacy: None  If necessary, Name of Provider/Nurse Contacted: None    Francisco Gibbs ,PharmD Clinical Pharmacist  11/02/2023  9:52 PM

## 2023-11-03 DIAGNOSIS — I440 Atrioventricular block, first degree: Secondary | ICD-10-CM | POA: Diagnosis present

## 2023-11-03 DIAGNOSIS — E1142 Type 2 diabetes mellitus with diabetic polyneuropathy: Secondary | ICD-10-CM | POA: Diagnosis present

## 2023-11-03 DIAGNOSIS — J189 Pneumonia, unspecified organism: Secondary | ICD-10-CM | POA: Diagnosis present

## 2023-11-03 DIAGNOSIS — W01198A Fall on same level from slipping, tripping and stumbling with subsequent striking against other object, initial encounter: Secondary | ICD-10-CM | POA: Diagnosis present

## 2023-11-03 DIAGNOSIS — D631 Anemia in chronic kidney disease: Secondary | ICD-10-CM | POA: Diagnosis present

## 2023-11-03 DIAGNOSIS — A419 Sepsis, unspecified organism: Secondary | ICD-10-CM | POA: Diagnosis present

## 2023-11-03 DIAGNOSIS — H9190 Unspecified hearing loss, unspecified ear: Secondary | ICD-10-CM | POA: Diagnosis present

## 2023-11-03 DIAGNOSIS — Z7984 Long term (current) use of oral hypoglycemic drugs: Secondary | ICD-10-CM | POA: Diagnosis not present

## 2023-11-03 DIAGNOSIS — N400 Enlarged prostate without lower urinary tract symptoms: Secondary | ICD-10-CM | POA: Diagnosis present

## 2023-11-03 DIAGNOSIS — J9621 Acute and chronic respiratory failure with hypoxia: Secondary | ICD-10-CM | POA: Diagnosis present

## 2023-11-03 DIAGNOSIS — Z1152 Encounter for screening for COVID-19: Secondary | ICD-10-CM | POA: Diagnosis not present

## 2023-11-03 DIAGNOSIS — Z833 Family history of diabetes mellitus: Secondary | ICD-10-CM | POA: Diagnosis not present

## 2023-11-03 DIAGNOSIS — Z85828 Personal history of other malignant neoplasm of skin: Secondary | ICD-10-CM | POA: Diagnosis not present

## 2023-11-03 DIAGNOSIS — K219 Gastro-esophageal reflux disease without esophagitis: Secondary | ICD-10-CM | POA: Diagnosis present

## 2023-11-03 DIAGNOSIS — E1122 Type 2 diabetes mellitus with diabetic chronic kidney disease: Secondary | ICD-10-CM | POA: Diagnosis present

## 2023-11-03 DIAGNOSIS — F17210 Nicotine dependence, cigarettes, uncomplicated: Secondary | ICD-10-CM | POA: Diagnosis present

## 2023-11-03 DIAGNOSIS — Y9301 Activity, walking, marching and hiking: Secondary | ICD-10-CM | POA: Diagnosis present

## 2023-11-03 DIAGNOSIS — N1832 Chronic kidney disease, stage 3b: Secondary | ICD-10-CM | POA: Diagnosis present

## 2023-11-03 DIAGNOSIS — E875 Hyperkalemia: Secondary | ICD-10-CM | POA: Diagnosis present

## 2023-11-03 DIAGNOSIS — J440 Chronic obstructive pulmonary disease with acute lower respiratory infection: Secondary | ICD-10-CM | POA: Diagnosis present

## 2023-11-03 DIAGNOSIS — R509 Fever, unspecified: Secondary | ICD-10-CM | POA: Diagnosis present

## 2023-11-03 DIAGNOSIS — J441 Chronic obstructive pulmonary disease with (acute) exacerbation: Secondary | ICD-10-CM | POA: Diagnosis present

## 2023-11-03 DIAGNOSIS — Z23 Encounter for immunization: Secondary | ICD-10-CM | POA: Diagnosis not present

## 2023-11-03 DIAGNOSIS — I350 Nonrheumatic aortic (valve) stenosis: Secondary | ICD-10-CM | POA: Diagnosis present

## 2023-11-03 DIAGNOSIS — E785 Hyperlipidemia, unspecified: Secondary | ICD-10-CM

## 2023-11-03 DIAGNOSIS — I129 Hypertensive chronic kidney disease with stage 1 through stage 4 chronic kidney disease, or unspecified chronic kidney disease: Secondary | ICD-10-CM | POA: Diagnosis present

## 2023-11-03 DIAGNOSIS — J9601 Acute respiratory failure with hypoxia: Secondary | ICD-10-CM

## 2023-11-03 DIAGNOSIS — Y92009 Unspecified place in unspecified non-institutional (private) residence as the place of occurrence of the external cause: Secondary | ICD-10-CM | POA: Diagnosis not present

## 2023-11-03 DIAGNOSIS — F32A Depression, unspecified: Secondary | ICD-10-CM | POA: Diagnosis present

## 2023-11-03 LAB — BASIC METABOLIC PANEL
Anion gap: 8 (ref 5–15)
BUN: 42 mg/dL — ABNORMAL HIGH (ref 8–23)
CO2: 21 mmol/L — ABNORMAL LOW (ref 22–32)
Calcium: 8.6 mg/dL — ABNORMAL LOW (ref 8.9–10.3)
Chloride: 106 mmol/L (ref 98–111)
Creatinine, Ser: 1.9 mg/dL — ABNORMAL HIGH (ref 0.61–1.24)
GFR, Estimated: 34 mL/min — ABNORMAL LOW (ref >60)
Glucose, Bld: 72 mg/dL (ref 70–99)
Potassium: 4.9 mmol/L (ref 3.5–5.1)
Sodium: 135 mmol/L (ref 135–145)

## 2023-11-03 LAB — URINALYSIS, W/ REFLEX TO CULTURE (INFECTION SUSPECTED)
Bilirubin Urine: NEGATIVE
Glucose, UA: NEGATIVE mg/dL
Hgb urine dipstick: NEGATIVE
Ketones, ur: NEGATIVE mg/dL
Leukocytes,Ua: NEGATIVE
Nitrite: NEGATIVE
Protein, ur: NEGATIVE mg/dL
Specific Gravity, Urine: 1.017 (ref 1.005–1.030)
Squamous Epithelial / HPF: 0 /[HPF] (ref 0–5)
pH: 5 (ref 5.0–8.0)

## 2023-11-03 LAB — CORTISOL-AM, BLOOD: Cortisol - AM: 33 ug/dL — ABNORMAL HIGH (ref 6.7–22.6)

## 2023-11-03 LAB — CBC
HCT: 27 % — ABNORMAL LOW (ref 39.0–52.0)
Hemoglobin: 8.5 g/dL — ABNORMAL LOW (ref 13.0–17.0)
MCH: 30.5 pg (ref 26.0–34.0)
MCHC: 31.5 g/dL (ref 30.0–36.0)
MCV: 96.8 fL (ref 80.0–100.0)
Platelets: 150 10*3/uL (ref 150–400)
RBC: 2.79 MIL/uL — ABNORMAL LOW (ref 4.22–5.81)
RDW: 13.6 % (ref 11.5–15.5)
WBC: 21.3 10*3/uL — ABNORMAL HIGH (ref 4.0–10.5)
nRBC: 0 % (ref 0.0–0.2)

## 2023-11-03 LAB — PROTIME-INR
INR: 1.4 — ABNORMAL HIGH (ref 0.8–1.2)
Prothrombin Time: 17.5 s — ABNORMAL HIGH (ref 11.4–15.2)

## 2023-11-03 LAB — TROPONIN I (HIGH SENSITIVITY): Troponin I (High Sensitivity): 30 ng/L — ABNORMAL HIGH (ref <18)

## 2023-11-03 LAB — PROCALCITONIN: Procalcitonin: 4.72 ng/mL

## 2023-11-03 MED ORDER — SERTRALINE HCL 50 MG PO TABS
50.0000 mg | ORAL_TABLET | Freq: Two times a day (BID) | ORAL | Status: DC
Start: 2023-11-03 — End: 2023-11-10
  Administered 2023-11-03 – 2023-11-10 (×15): 50 mg via ORAL
  Filled 2023-11-03 (×15): qty 1

## 2023-11-03 MED ORDER — ASPIRIN 81 MG PO CHEW
81.0000 mg | CHEWABLE_TABLET | Freq: Every day | ORAL | Status: DC
  Administered 2023-11-03 – 2023-11-10 (×8): 81 mg via ORAL
  Filled 2023-11-03 (×8): qty 1

## 2023-11-03 MED ORDER — ENOXAPARIN SODIUM 30 MG/0.3ML IJ SOSY
30.0000 mg | PREFILLED_SYRINGE | INTRAMUSCULAR | Status: DC
  Administered 2023-11-05: 30 mg via SUBCUTANEOUS
  Filled 2023-11-03 (×2): qty 0.3

## 2023-11-03 MED ORDER — AZITHROMYCIN 500 MG IV SOLR
500.0000 mg | INTRAVENOUS | Status: DC
  Administered 2023-11-03: 500 mg via INTRAVENOUS
  Filled 2023-11-03 (×2): qty 5

## 2023-11-03 MED ORDER — UMECLIDINIUM BROMIDE 62.5 MCG/ACT IN AEPB
1.0000 | INHALATION_SPRAY | Freq: Every day | RESPIRATORY_TRACT | Status: DC
  Administered 2023-11-04 – 2023-11-10 (×7): 1 via RESPIRATORY_TRACT
  Filled 2023-11-03 (×2): qty 7

## 2023-11-03 MED ORDER — DEXTROSE 5 % IV SOLN
500.0000 mg | INTRAVENOUS | Status: DC
  Administered 2023-11-04 – 2023-11-06 (×3): 500 mg via INTRAVENOUS
  Filled 2023-11-03: qty 510
  Filled 2023-11-03: qty 5
  Filled 2023-11-03: qty 510

## 2023-11-03 MED ORDER — AMLODIPINE BESYLATE 5 MG PO TABS: 10.0000 mg | ORAL_TABLET | Freq: Every day | ORAL | Status: DC

## 2023-11-03 MED ORDER — FUROSEMIDE 40 MG PO TABS: 20.0000 mg | ORAL_TABLET | Freq: Every day | ORAL | Status: DC | PRN

## 2023-11-03 MED ORDER — HYDROCOD POLI-CHLORPHE POLI ER 10-8 MG/5ML PO SUER
5.0000 mL | Freq: Two times a day (BID) | ORAL | Status: DC | PRN
  Administered 2023-11-04: 5 mL via ORAL
  Filled 2023-11-03: qty 5

## 2023-11-03 MED ORDER — LISINOPRIL 10 MG PO TABS: 40.0000 mg | ORAL_TABLET | Freq: Every day | ORAL | Status: DC

## 2023-11-03 MED ORDER — FLUTICASONE PROPIONATE 50 MCG/ACT NA SUSP
2.0000 | Freq: Every evening | NASAL | Status: DC
  Administered 2023-11-05 – 2023-11-09 (×5): 2 via NASAL
  Filled 2023-11-03: qty 16

## 2023-11-03 MED ORDER — ACETAMINOPHEN 650 MG RE SUPP: 650.0000 mg | Freq: Four times a day (QID) | RECTAL | Status: DC | PRN

## 2023-11-03 MED ORDER — LORATADINE 10 MG PO TABS
10.0000 mg | ORAL_TABLET | Freq: Every day | ORAL | Status: DC
  Administered 2023-11-03 – 2023-11-10 (×8): 10 mg via ORAL
  Filled 2023-11-03 (×8): qty 1

## 2023-11-03 MED ORDER — LACTATED RINGERS IV SOLN: INTRAVENOUS | Status: AC

## 2023-11-03 MED ORDER — ATORVASTATIN CALCIUM 20 MG PO TABS
80.0000 mg | ORAL_TABLET | Freq: Every evening | ORAL | Status: DC
  Administered 2023-11-03 – 2023-11-09 (×7): 80 mg via ORAL
  Filled 2023-11-03 (×2): qty 1
  Filled 2023-11-03: qty 4
  Filled 2023-11-03 (×4): qty 1

## 2023-11-03 MED ORDER — LINAGLIPTIN 5 MG PO TABS
5.0000 mg | ORAL_TABLET | Freq: Every day | ORAL | Status: DC
  Filled 2023-11-03: qty 1

## 2023-11-03 MED ORDER — IPRATROPIUM-ALBUTEROL 0.5-2.5 (3) MG/3ML IN SOLN
3.0000 mL | Freq: Four times a day (QID) | RESPIRATORY_TRACT | Status: DC
  Administered 2023-11-03 – 2023-11-04 (×5): 3 mL via RESPIRATORY_TRACT
  Filled 2023-11-03 (×5): qty 3

## 2023-11-03 MED ORDER — GABAPENTIN 300 MG PO CAPS
300.0000 mg | ORAL_CAPSULE | Freq: Three times a day (TID) | ORAL | Status: DC
  Administered 2023-11-03 – 2023-11-10 (×22): 300 mg via ORAL
  Filled 2023-11-03 (×22): qty 1

## 2023-11-03 MED ORDER — ONDANSETRON HCL 4 MG PO TABS: 4.0000 mg | ORAL_TABLET | Freq: Four times a day (QID) | ORAL | Status: DC | PRN

## 2023-11-03 MED ORDER — ADULT MULTIVITAMIN W/MINERALS CH
1.0000 | ORAL_TABLET | Freq: Every day | ORAL | Status: DC
  Administered 2023-11-03 – 2023-11-10 (×8): 1 via ORAL
  Filled 2023-11-03 (×7): qty 1

## 2023-11-03 MED ORDER — HYDRALAZINE HCL 50 MG PO TABS: 25.0000 mg | ORAL_TABLET | Freq: Two times a day (BID) | ORAL | Status: DC

## 2023-11-03 MED ORDER — ENOXAPARIN SODIUM 40 MG/0.4ML IJ SOSY
40.0000 mg | PREFILLED_SYRINGE | INTRAMUSCULAR | Status: DC
  Administered 2023-11-03: 40 mg via SUBCUTANEOUS
  Filled 2023-11-03: qty 0.4

## 2023-11-03 MED ORDER — ACETAMINOPHEN 325 MG PO TABS
650.0000 mg | ORAL_TABLET | Freq: Four times a day (QID) | ORAL | Status: DC | PRN
  Administered 2023-11-04: 650 mg via ORAL
  Filled 2023-11-03: qty 2

## 2023-11-03 MED ORDER — FERROUS SULFATE 325 (65 FE) MG PO TABS
325.0000 mg | ORAL_TABLET | Freq: Two times a day (BID) | ORAL | Status: DC
  Administered 2023-11-03 – 2023-11-10 (×15): 325 mg via ORAL
  Filled 2023-11-03 (×16): qty 1

## 2023-11-03 MED ORDER — SODIUM CHLORIDE 0.9 % IV SOLN
2.0000 g | INTRAVENOUS | Status: DC
  Administered 2023-11-03 – 2023-11-05 (×3): 2 g via INTRAVENOUS
  Filled 2023-11-03 (×4): qty 20

## 2023-11-03 MED ORDER — TAMSULOSIN HCL 0.4 MG PO CAPS
0.4000 mg | ORAL_CAPSULE | Freq: Every day | ORAL | Status: DC
  Administered 2023-11-03 – 2023-11-09 (×7): 0.4 mg via ORAL
  Filled 2023-11-03 (×7): qty 1

## 2023-11-03 MED ORDER — TRAZODONE HCL 50 MG PO TABS
25.0000 mg | ORAL_TABLET | Freq: Every evening | ORAL | Status: DC | PRN
  Administered 2023-11-04 – 2023-11-08 (×2): 25 mg via ORAL
  Filled 2023-11-03 (×2): qty 1

## 2023-11-03 MED ORDER — ONDANSETRON HCL 4 MG/2ML IJ SOLN: 4.0000 mg | Freq: Four times a day (QID) | INTRAMUSCULAR | Status: DC | PRN

## 2023-11-03 MED ORDER — SODIUM CHLORIDE 0.9 % IV BOLUS
1000.0000 mL | Freq: Once | INTRAVENOUS | Status: AC
  Administered 2023-11-03: 1000 mL via INTRAVENOUS

## 2023-11-03 MED ORDER — MAGNESIUM HYDROXIDE 400 MG/5ML PO SUSP: 30.0000 mL | Freq: Every day | ORAL | Status: DC | PRN

## 2023-11-03 MED ORDER — FLUTICASONE FUROATE-VILANTEROL 100-25 MCG/ACT IN AEPB
1.0000 | INHALATION_SPRAY | Freq: Every day | RESPIRATORY_TRACT | Status: DC
  Administered 2023-11-04 – 2023-11-10 (×7): 1 via RESPIRATORY_TRACT
  Filled 2023-11-03 (×2): qty 28

## 2023-11-03 MED ORDER — PANTOPRAZOLE SODIUM 40 MG PO TBEC
40.0000 mg | DELAYED_RELEASE_TABLET | Freq: Every day | ORAL | Status: DC
  Administered 2023-11-03 – 2023-11-10 (×8): 40 mg via ORAL
  Filled 2023-11-03 (×8): qty 1

## 2023-11-03 MED ORDER — LACTATED RINGERS IV SOLN
150.0000 mL/h | INTRAVENOUS | Status: DC
  Administered 2023-11-03 (×2): 150 mL/h via INTRAVENOUS

## 2023-11-03 MED ORDER — MAGNESIUM OXIDE -MG SUPPLEMENT 400 (240 MG) MG PO TABS
400.0000 mg | ORAL_TABLET | Freq: Every day | ORAL | Status: DC
  Administered 2023-11-03 – 2023-11-10 (×8): 400 mg via ORAL
  Filled 2023-11-03 (×8): qty 1

## 2023-11-03 MED ORDER — GUAIFENESIN ER 600 MG PO TB12
600.0000 mg | ORAL_TABLET | Freq: Two times a day (BID) | ORAL | Status: DC
  Administered 2023-11-03 – 2023-11-10 (×16): 600 mg via ORAL
  Filled 2023-11-03 (×16): qty 1

## 2023-11-03 NOTE — ED Notes (Signed)
RN called CCMD to verify patient was visible to them. Pt on cardiac monitoring and now visible to CCMD.

## 2023-11-03 NOTE — Assessment & Plan Note (Signed)
-   The patient will be placed on supplemental coverage with NovoLog. - We will hold off metformin and resume Januvia. - We will continue Neurontin.

## 2023-11-03 NOTE — Assessment & Plan Note (Signed)
-   We will continue Zoloft 

## 2023-11-03 NOTE — Assessment & Plan Note (Signed)
-   We will continue Flomax 

## 2023-11-03 NOTE — Assessment & Plan Note (Signed)
-   This is clearly secondary to #1. - O2 protocol will be followed. - Management otherwise as above. 

## 2023-11-03 NOTE — Progress Notes (Signed)
PHARMACIST - PHYSICIAN COMMUNICATION  CONCERNING:  Enoxaparin (Lovenox) for DVT Prophylaxis   RECOMMENDATION: Patient was prescribed enoxaparin 40mg  Q24 hours for VTE prophylaxis.   Filed Weights   11/02/23 2126  Weight: 79.4 kg (175 lb)   Body mass index is 27.41 kg/m.  Estimated Creatinine Clearance: 27.1 mL/min (A) (by C-G formula based on SCr of 1.9 mg/dL (H)).  Patient is candidate for enoxaparin 30 mg every 24 hours based on CrCl < 30 mL/min.   DESCRIPTION: Pharmacy has adjusted enoxaparin dose per [facility] policy.  Patient is now receiving enoxaparin 30 mg every 24 hours.   Littie Deeds, PharmD Pharmacy Resident  11/03/2023 11:24 AM

## 2023-11-03 NOTE — Progress Notes (Signed)
PROGRESS NOTE   HPI was taken from Dr. Arville Care: Francisco Gibbs is a 89 y.o. Caucasian male with medical history significant for COPD, GERD, hypertension, dyslipidemia, type 2 diabetes mellitus and aortic stenosis, who presented to the emergency room with acute onset of generalized weakness and subsequent fall.  The patient was walking at home when he lost his balance and fell backwards striking the head on the wall.  He believes that he had transient loss of consciousness.  When he woke up he was not able to stand back up again and.  He stayed on the ground for a couple of hours when his daughter came home and found him on the floor.  He admits to recent cough with expectoration of whitish sputum as well as occasional dyspnea and wheezing.  No nausea or vomiting or abdominal pain.  No dysuria, oliguria or hematuria or flank pain.  He denied any headache or dizziness or blurred vision.  No paresthesias or focal muscle weakness.  He was noted to have fever here.  He denied any chills.   ED Course: When the patient came to the ER temperature was 100.810/38.2 with pulse oximetry of 97% on 1 L of O2 by nasal cannula and otherwise normal vital signs.  Labs revealed a BUN of 39 with a creatinine 1.62 comparable to previous levels and a high-sensitivity troponin I was 29 and later 30.  CBC showed leukocytosis 25.5 with neutrophilia and anemia and anemia comparable to previous levels.  Respiratory panel came back negative and UA was unremarkable.  PT was 15.4 and INR of 1.2.  Blood cultures were drawn. EKG as reviewed by me :  EKG showed sinus rhythm with rate of 100 with prolonged PR interval and borderline right axis deviation. Imaging: Portable chest x-ray showed heterogenous right upper lobe airspace opacity suspicious for pneumonia with recommendation for radiographic follow-up.   The patient was given a gram of Tylenol, 2 g of IV cefepime and IV Flagyl as well as IV vancomycin, 4 mg of IV Zofran and 2 L bolus  of IV lactated ringer.  He will be admitted to a medical telemetry bed for further evaluation and management.   CUB GIANNINI  ZOX:[id number] DOB: 1933/07/12 DOA: 11/02/2023 PCP: Abram Sander, MD   Assessment & Plan:   Principal Problem:   Sepsis due to pneumonia [facility]) Active Problems:   Acute respiratory failure with hypoxia (HCC)   Dyslipidemia   Type 2 diabetes mellitus with peripheral neuropathy (HCC)   Essential hypertension   Depression   BPH (benign prostatic hyperplasia)  Assessment and Plan: Sepsis due to pneumonia: continue on IV rocephin, azithromycin, bronchodilators. Encourage incentive spirometry. Met criteria w/ leukocytosis, tachypnea & pneumonia. Blood cxs NGTD  Acute hypoxic respiratory failure: secondary to pneumonia. Continue on IV abxs. Continue on supplemental oxygen and wean as tolerated   HLD: continue on statin    DM2: likely poorly controlled. Continue on SSI w/ accuechecks   BPH: continue on flomax   Depression: severity unknown. Continue on zoloft  HTN: holding home anti-HTN meds in setting of sepsis          DVT prophylaxis: lovenox  Code Status: full  Family Communication:  Disposition Plan: depends on PT/OT recs.  Level of care: Telemetry Medical Consultants:    Procedures:   Antimicrobials: azithromycin, rocephin   Subjective: Pt c/o shortness of breath   Objective: Vitals:   11/03/23 0651 11/03/23 0715 11/03/23 0720 11/03/23 0730  BP: (!) 89/63 (!) 95/48  Marland Kitchen)  98/53  Pulse:  83 88 81  Resp:  17 16 15   Temp:   98.4 F (36.9 C)   TempSrc:   Oral   SpO2:  94% 96% 94%  Weight:      Height:        Intake/Output Summary (Last 24 hours) at 11/03/2023 0905 Last data filed at 11/03/2023 2841 Gross per 24 hour  Intake 652.96 ml  Output --  Net 652.96 ml   Filed Weights   11/02/23 2126  Weight: 79.4 kg    Examination:  General exam: Appears calm and comfortable  Respiratory system: decreased breath sounds b/l   Cardiovascular system: S1 & S2+. No, rubs, gallops or clicks. Gastrointestinal system: Abdomen is nondistended, soft and nontender. Normal bowel sounds heard. Central nervous system: Alert and awake Psychiatry: Judgement and insight appears poor. Flat mood     Data Reviewed: I have personally reviewed following labs and imaging studies  CBC: Recent Labs  Lab 11/02/23 2136 11/03/23 0514  WBC 25.5* 21.3*  NEUTROABS 22.4*  --   HGB 9.4* 8.5*  HCT 29.1* 27.0*  MCV 95.1 96.8  PLT 195 150   Basic Metabolic Panel: Recent Labs  Lab 11/02/23 2136 11/03/23 0514  NA 137 135  K 4.8 4.9  CL 105 106  CO2 23 21*  GLUCOSE 91 72  BUN 39* 42*  CREATININE 1.62* 1.90*  CALCIUM 9.2 8.6*   GFR: Estimated Creatinine Clearance: 27.1 mL/min (A) (by C-G formula based on SCr of 1.9 mg/dL (H)). Liver Function Tests: Recent Labs  Lab 11/02/23 2136  AST 23  ALT 16  ALKPHOS 38  BILITOT 0.6  PROT 6.2*  ALBUMIN 3.9   No results for input(s): "LIPASE", "AMYLASE" in the last 168 hours. No results for input(s): "AMMONIA" in the last 168 hours. Coagulation Profile: Recent Labs  Lab 11/02/23 2136 11/03/23 0514  INR 1.2 1.4*   Cardiac Enzymes: No results for input(s): "CKTOTAL", "CKMB", "CKMBINDEX", "TROPONINI" in the last 168 hours. BNP (last 3 results) No results for input(s): "PROBNP" in the last 8760 hours. HbA1C: No results for input(s): "HGBA1C" in the last 72 hours. CBG: No results for input(s): "GLUCAP" in the last 168 hours. Lipid Profile: No results for input(s): "CHOL", "HDL", "LDLCALC", "TRIG", "CHOLHDL", "LDLDIRECT" in the last 72 hours. Thyroid Function Tests: No results for input(s): "TSH", "T4TOTAL", "FREET4", "T3FREE", "THYROIDAB" in the last 72 hours. Anemia Panel: No results for input(s): "VITAMINB12", "FOLATE", "FERRITIN", "TIBC", "IRON", "RETICCTPCT" in the last 72 hours. Sepsis Labs: Recent Labs  Lab 11/02/23 2136 11/02/23 2334 11/03/23 0514  PROCALCITON   --   --  4.72  LATICACIDVEN 1.4 1.4  --     Recent Results (from the past 240 hour(s))  Resp panel by RT-PCR (RSV, Flu A&B, Covid) Anterior Nasal Swab     Status: None   Collection Time: 11/02/23  9:37 PM   Specimen: Anterior Nasal Swab  Result Value Ref Range Status   SARS Coronavirus 2 by RT PCR NEGATIVE NEGATIVE Final    Comment: (NOTE) SARS-CoV-2 target nucleic acids are NOT DETECTED.  The SARS-CoV-2 RNA is generally detectable in upper respiratory specimens during the acute phase of infection. The lowest concentration of SARS-CoV-2 viral copies this assay can detect is 138 copies/mL. A negative result does not preclude SARS-Cov-2 infection and should not be used as the sole basis for treatment or other patient management decisions. A negative result may occur with  improper specimen collection/handling, submission of specimen other than nasopharyngeal  swab, presence of viral mutation(s) within the areas targeted by this assay, and inadequate number of viral copies(<138 copies/mL). A negative result must be combined with clinical observations, patient history, and epidemiological information. The expected result is Negative.  Fact Sheet for Patients:  BloggerCourse.com  Fact Sheet for Healthcare Providers:  SeriousBroker.it  This test is no t yet approved or cleared by the [place] FDA and  has been authorized for detection and/or diagnosis of SARS-CoV-2 by FDA under an Emergency Use Authorization (EUA). This EUA will remain  in effect (meaning this test can be used) for the duration of the COVID-19 declaration under Section 564(b)(1) of the Act, 21 U.S.C.section 360bbb-3(b)(1), unless the authorization is terminated  or revoked sooner.       Influenza A by PCR NEGATIVE NEGATIVE Final   Influenza B by PCR NEGATIVE NEGATIVE Final    Comment: (NOTE) The Xpert Xpress SARS-CoV-2/FLU/RSV plus assay is intended as an  aid in the diagnosis of influenza from Nasopharyngeal swab specimens and should not be used as a sole basis for treatment. Nasal washings and aspirates are unacceptable for Xpert Xpress SARS-CoV-2/FLU/RSV testing.  Fact Sheet for Patients: BloggerCourse.com  Fact Sheet for Healthcare Providers: SeriousBroker.it  This test is not yet approved or cleared by the [place] FDA and has been authorized for detection and/or diagnosis of SARS-CoV-2 by FDA under an Emergency Use Authorization (EUA). This EUA will remain in effect (meaning this test can be used) for the duration of the COVID-19 declaration under Section 564(b)(1) of the Act, 21 U.S.C. section 360bbb-3(b)(1), unless the authorization is terminated or revoked.     Resp Syncytial Virus by PCR NEGATIVE NEGATIVE Final    Comment: (NOTE) Fact Sheet for Patients: BloggerCourse.com  Fact Sheet for Healthcare Providers: SeriousBroker.it  This test is not yet approved or cleared by the [place] FDA and has been authorized for detection and/or diagnosis of SARS-CoV-2 by FDA under an Emergency Use Authorization (EUA). This EUA will remain in effect (meaning this test can be used) for the duration of the COVID-19 declaration under Section 564(b)(1) of the Act, 21 U.S.C. section 360bbb-3(b)(1), unless the authorization is terminated or revoked.  Performed at [facility], [address] [place], [place] [address]   Blood Culture (routine x 2)     Status: None (Preliminary result)   Collection Time: 11/02/23  9:37 PM   Specimen: BLOOD  Result Value Ref Range Status   Specimen Description BLOOD BLOOD LEFT ARM  Final   Special Requests   Final    BOTTLES DRAWN AEROBIC AND ANAEROBIC Blood Culture adequate volume   Culture   Final    NO GROWTH < 12 HOURS Performed at [facility], [address]  [place], [place] [address]    Report Status PENDING  Incomplete  Blood Culture (routine x 2)     Status: None (Preliminary result)   Collection Time: 11/02/23  9:37 PM   Specimen: BLOOD  Result Value Ref Range Status   Specimen Description BLOOD BLOOD RIGHT ARM  Final   Special Requests   Final    BOTTLES DRAWN AEROBIC AND ANAEROBIC Blood Culture adequate volume   Culture   Final    NO GROWTH < 12 HOURS Performed at [facility], [address], [place], [place] [address]    Report Status PENDING  Incomplete         Radiology Studies: CT Head Wo Contrast  Result Date: 11/03/2023 CLINICAL DATA:  FALL  EXAM: CT HEAD WITHOUT CONTRAST CT CERVICAL SPINE WITHOUT CONTRAST TECHNIQUE: Multidetector CT imaging of the head and cervical spine was performed following the standard protocol without intravenous contrast. Multiplanar CT image reconstructions of the cervical spine were also generated. RADIATION DOSE REDUCTION: This exam was performed according to the departmental dose-optimization program which includes automated exposure control, adjustment of the mA and/or kV according to patient size and/or use of iterative reconstruction technique. COMPARISON:  None Available. FINDINGS: CT HEAD FINDINGS Brain: There is no mass, hemorrhage or extra-axial collection. The size and configuration of the ventricles and extra-axial CSF spaces are normal. The brain parenchyma is normal, without evidence of acute or chronic infarction. Vascular: No abnormal hyperdensity of the major intracranial arteries or dural venous sinuses. No intracranial atherosclerosis. Skull: The visualized skull base, calvarium and extracranial soft tissues are normal. Sinuses/Orbits: No fluid levels or advanced mucosal thickening of the visualized paranasal sinuses. No mastoid or middle ear effusion. The orbits are normal. CT CERVICAL SPINE FINDINGS Alignment: No acute subluxation. Facets are aligned. Occipital condyles are normally  positioned. Skull base and vertebrae: No acute fracture. Soft tissues and spinal canal: No prevertebral fluid or swelling. No visible canal hematoma. Disc levels: No advanced spinal canal or neural foraminal stenosis. Upper chest: No pneumothorax, pulmonary nodule or pleural effusion. Emphysema. Other: Left carotid stent IMPRESSION: 1. No acute intracranial abnormality. 2. No acute fracture or subluxation of the cervical spine. Emphysema (ICD10-J43.9). Electronically Signed   By: Deatra Robinson M.D.   On: 11/03/2023 00:36   CT Cervical Spine Wo Contrast  Result Date: 11/03/2023 CLINICAL DATA:  FALL EXAM: CT HEAD WITHOUT CONTRAST CT CERVICAL SPINE WITHOUT CONTRAST TECHNIQUE: Multidetector CT imaging of the head and cervical spine was performed following the standard protocol without intravenous contrast. Multiplanar CT image reconstructions of the cervical spine were also generated. RADIATION DOSE REDUCTION: This exam was performed according to the departmental dose-optimization program which includes automated exposure control, adjustment of the mA and/or kV according to patient size and/or use of iterative reconstruction technique. COMPARISON:  None Available. FINDINGS: CT HEAD FINDINGS Brain: There is no mass, hemorrhage or extra-axial collection. The size and configuration of the ventricles and extra-axial CSF spaces are normal. The brain parenchyma is normal, without evidence of acute or chronic infarction. Vascular: No abnormal hyperdensity of the major intracranial arteries or dural venous sinuses. No intracranial atherosclerosis. Skull: The visualized skull base, calvarium and extracranial soft tissues are normal. Sinuses/Orbits: No fluid levels or advanced mucosal thickening of the visualized paranasal sinuses. No mastoid or middle ear effusion. The orbits are normal. CT CERVICAL SPINE FINDINGS Alignment: No acute subluxation. Facets are aligned. Occipital condyles are normally positioned. Skull base and  vertebrae: No acute fracture. Soft tissues and spinal canal: No prevertebral fluid or swelling. No visible canal hematoma. Disc levels: No advanced spinal canal or neural foraminal stenosis. Upper chest: No pneumothorax, pulmonary nodule or pleural effusion. Emphysema. Other: Left carotid stent IMPRESSION: 1. No acute intracranial abnormality. 2. No acute fracture or subluxation of the cervical spine. Emphysema (ICD10-J43.9). Electronically Signed   By: Deatra Robinson M.D.   On: 11/03/2023 00:36   DG Chest Port 1 View  Result Date: 11/03/2023 CLINICAL DATA:  Possible sepsis EXAM: PORTABLE CHEST 1 VIEW COMPARISON:  02/26/2021 FINDINGS: Heterogeneous right upper lobe airspace opacity. Streaky left lung base opacity. Normal cardiac size with aortic atherosclerosis IMPRESSION: Heterogeneous right upper lobe airspace opacity suspicious for pneumonia. Radiographic follow-up to resolution is recommended. Electronically Signed   By:  Jasmine Pang M.D.   On: 11/03/2023 00:10        Scheduled Meds:  amLODipine  10 mg Oral Daily   aspirin  81 mg Oral Daily   atorvastatin  80 mg Oral QPM   enoxaparin (LOVENOX) injection  40 mg Subcutaneous Q24H   ferrous sulfate  325 mg Oral BID WC   fluticasone  2 spray Each Nare QPM   fluticasone furoate-vilanterol  1 puff Inhalation Daily   And   umeclidinium bromide  1 puff Inhalation Daily   gabapentin  300 mg Oral TID   guaiFENesin  600 mg Oral BID   hydrALAZINE  25 mg Oral BID   ipratropium-albuterol  3 mL Nebulization QID   linagliptin  5 mg Oral Daily   lisinopril  40 mg Oral Daily   loratadine  10 mg Oral Daily   magnesium oxide  400 mg Oral Daily   multivitamin with minerals  1 tablet Oral Daily   pantoprazole  40 mg Oral Daily   sertraline  50 mg Oral BID   tamsulosin  0.4 mg Oral QPC supper   Continuous Infusions:  azithromycin Stopped (11/03/23 0859)   cefTRIAXone (ROCEPHIN)  IV Stopped (11/03/23 0859)   lactated ringers 150 mL/hr (11/03/23 0714)      LOS: 0 days       Charise Killian, MD [facility] Pager [phone]  If 7PM-7AM, please contact night-coverage [email or web address]  11/03/2023, 9:05 AM

## 2023-11-03 NOTE — Assessment & Plan Note (Addendum)
-   We will hold off antihypertensive therapy with current hypotension.

## 2023-11-03 NOTE — TOC Initial Note (Signed)
Transition of Care [facility]) - Initial/Assessment Note    Patient Details  Name: Francisco Gibbs MRN: [id number] Date of Birth: 1933-04-29  Transition of [facility]) CM/SW Contact:    Marquita Palms, LCSW Phone Number: 11/03/2023, 9:48 AM  Clinical Narrative:                  CSW met with patient at bedisde. Patient reports he uses O2 at home  but unsure how much. Patient reports he uses [facility] [address]. Patient reports he has a cane at home that he does not use often. Patient is hard of hearing an unable to answer some of CSW questions. Patient may need Home health aide at home. No other needs at this time.       Patient Goals and CMS Choice            Expected Discharge Plan and Services                                              Prior Living Arrangements/Services                       Activities of Daily Living      Permission Sought/Granted                  Emotional Assessment              Admission diagnosis:  Sepsis due to pneumonia (HCC) [J18.9, A41.9] Patient Active Problem List   Diagnosis Date Noted   Sepsis due to pneumonia (HCC) 11/03/2023   Acute respiratory failure with hypoxia (HCC) 11/03/2023   Dyslipidemia 11/03/2023   Type 2 diabetes mellitus with peripheral neuropathy (HCC) 11/03/2023   BPH (benign prostatic hyperplasia) 11/03/2023   Carotid stenosis, left 02/25/2021   Carotid stenosis 01/15/2021   Benign prostatic hyperplasia 04/09/2020   Stage 3b chronic kidney disease (HCC) 04/09/2020   Hyperkalemia 10/19/2018   Sepsis (HCC) 06/25/2018   CAP (community acquired pneumonia) 06/25/2018   Hypomagnesemia 04/28/2018   Generalized edema 03/18/2018   Thyroid nodule 11/02/2017   Low back pain 10/08/2017   Weight loss 06/01/2017   Edema of foot 02/05/2017   Urinary incontinence 08/19/2016   Constipation 03/27/2016   Allergic rhinitis 05/02/2015   Leg pain 02/14/2015   Increased frequency  of urination 12/11/2014   Nicotine dependence, uncomplicated 06/22/2014   Testicular pain 06/22/2014   Diabetic peripheral neuropathy (HCC) 10/06/2013   Encounter for general adult medical examination without abnormal findings 06/09/2013   Acute bronchitis 12/21/2012   Essential hypertension 03/31/2012   Personal history of nicotine dependence 03/31/2012   Hoarseness 02/19/2011   Age-related cataract 11/14/2010   Polymyalgia rheumatica (HCC) 11/14/2010   Foreign body granuloma of muscle 02/07/2009   Pain in shoulder 02/07/2009   Neoplasm of unspecified behavior of bone, soft tissue, and skin 01/28/2008   Hyperlipidemia 12/23/2007   Gastro-esophageal reflux disease without esophagitis 12/23/2007   Chronic obstructive pulmonary disease (HCC) 12/23/2007   Diabetes mellitus, type II (HCC) 12/23/2007   Anxiety 12/23/2007   Depression 12/23/2007   History of colonic polyps 12/23/2007   Hydrocele 12/23/2007   Hypercalcemia 12/23/2007   Nephrolithiasis 12/23/2007   Preglaucoma 12/23/2007   PCP:  Abram Sander, MD Pharmacy:   [id number] - [place] ([place]), [place] - [address]  [address] [place] ([place]) [place] [address] Phone: [phone] Fax: [phone]     Social Determinants of Health (SDOH) Social History: SDOH Screenings   Food Insecurity: No Food Insecurity (06/15/2023)   Received from [facility]  Transportation Needs: No Transportation Needs (06/15/2023)   Received from [facility]  Utilities: Not At Risk (06/15/2023)   Received from [facility]  Financial Resource Strain: Low Risk  (06/15/2023)   Received from [facility]  Tobacco Use: Medium Risk (09/28/2023)   Received from [facility]   SDOH Interventions:     Readmission Risk Interventions    11/03/2023    9:46 AM  Readmission Risk Prevention Plan  Transportation Screening Complete  PCP or  Specialist Appt within 3-5 Days Complete  HRI or Home Care Consult Complete  Social Work Consult for Recovery Care Planning/Counseling Complete  Palliative Care Screening Not Applicable  Medication Review Oceanographer) Not Complete  Med Review Comments Patient will review medication with MD upon discharge

## 2023-11-03 NOTE — Assessment & Plan Note (Addendum)
-   This is likely secondary to right-sided pneumonia. - The patient will be admitted to a medical telemetry bed. - Will continue antibiotic therapy with IV Rocephin and Zithromax. - Mucolytic therapy be provided as well as duo nebs q.i.d. and q.4 hours p.r.n. - We will follow blood cultures. - The patient will be hydrated with IV lactated ringer. - Sepsis is met by leukocytosis and tachypnea.  He may be having severe sepsis based on associated hypotension with brief MAP of 50 and hypoxia.

## 2023-11-03 NOTE — H&P (Signed)
[place]   PATIENT NAME: Francisco Gibbs    MR#:  [id number]  DATE OF BIRTH:  03-05-1933  DATE OF ADMISSION:  11/02/2023  PRIMARY CARE PHYSICIAN: Abram Sander, MD   Patient is coming from: Home  REQUESTING/REFERRING PHYSICIAN: Trinna Post, MD  CHIEF COMPLAINT:   Chief Complaint  Patient presents with   Fall    HISTORY OF PRESENT ILLNESS:  Francisco Gibbs is a 89 y.o. Caucasian male with medical history significant for COPD, GERD, hypertension, dyslipidemia, type 2 diabetes mellitus and aortic stenosis, who presented to the emergency room with acute onset of generalized weakness and subsequent fall.  The patient was walking at home when he lost his balance and fell backwards striking the head on the wall.  He believes that he had transient loss of consciousness.  When he woke up he was not able to stand back up again and.  He stayed on the ground for a couple of hours when his daughter came home and found him on the floor.  He admits to recent cough with expectoration of whitish sputum as well as occasional dyspnea and wheezing.  No nausea or vomiting or abdominal pain.  No dysuria, oliguria or hematuria or flank pain.  He denied any headache or dizziness or blurred vision.  No paresthesias or focal muscle weakness.  He was noted to have fever here.  He denied any chills.  ED Course: When the patient came to the ER temperature was 100.810/38.2 with pulse oximetry of 97% on 1 L of O2 by nasal cannula and otherwise normal vital signs.  Labs revealed a BUN of 39 with a creatinine 1.62 comparable to previous levels and a high-sensitivity troponin I was 29 and later 30.  CBC showed leukocytosis 25.5 with neutrophilia and anemia and anemia comparable to previous levels.  Respiratory panel came back negative and UA was unremarkable.  PT was 15.4 and INR of 1.2.  Blood cultures were drawn. EKG as reviewed by me :  EKG showed sinus rhythm with rate of 100 with prolonged PR interval and borderline  right axis deviation. Imaging: Portable chest x-ray showed heterogenous right upper lobe airspace opacity suspicious for pneumonia with recommendation for radiographic follow-up.  The patient was given a gram of Tylenol, 2 g of IV cefepime and IV Flagyl as well as IV vancomycin, 4 mg of IV Zofran and 2 L bolus of IV lactated ringer.  He will be admitted to a medical telemetry bed for further evaluation and management. PAST MEDICAL HISTORY:   Past Medical History:  Diagnosis Date   Aortic stenosis    Basal cell carcinoma 03/16/2020   R tip of nose, R preauricular area, Moh's 06/06/2020   COPD (chronic obstructive pulmonary disease) (HCC)    Diabetes mellitus without complication (HCC)    Pt takes Metformin.   GERD (gastroesophageal reflux disease)    HLD (hyperlipidemia)    Hypertension     PAST SURGICAL HISTORY:   Past Surgical History:  Procedure Laterality Date   CAROTID PTA/STENT INTERVENTION Left 02/25/2021   Procedure: CAROTID PTA/STENT INTERVENTION;  Surgeon: Annice Needy, MD;  Location: [facility] INVASIVE CV LAB;  Service: Cardiovascular;  Laterality: Left;   HYDROCELE EXCISION / REPAIR     INGUINAL HERNIA REPAIR     KNEE SURGERY     LEFT HEART CATH AND CORONARY ANGIOGRAPHY Left 01/30/2021   Procedure: LEFT HEART CATH AND CORONARY ANGIOGRAPHY;  Surgeon: Alwyn Pea, MD;  Location: [facility]  INVASIVE CV LAB;  Service: Cardiovascular;  Laterality: Left;    SOCIAL HISTORY:   Social History   Tobacco Use   Smoking status: Former    Current packs/day: 1.00    Average packs/day: 1 pack/day for 70.0 years (70.0 ttl pk-yrs)    Types: Cigarettes    Passive exposure: Past   Smokeless tobacco: Never  Substance Use Topics   Alcohol use: Not Currently    FAMILY HISTORY:   Family History  Problem Relation Age of Onset   Diabetes Sister    Diabetes Paternal Aunt     DRUG ALLERGIES:   Allergies  Allergen Reactions   Guaifenesin Shortness Of Breath    Other reaction(s):  Other (See Comments)   Penicillin G Rash and Shortness Of Breath    Has patient had a PCN reaction causing immediate rash, facial/tongue/throat swelling, SOB or lightheadedness with hypotension: Yes Has patient had a PCN reaction causing severe rash involving mucus membranes or skin necrosis: No Has patient had a PCN reaction that required hospitalization: No Has patient had a PCN reaction occurring within the last 10 years: No If all of the above answers are "NO", then may proceed with Cephalosporin use. Has patient had a PCN reaction causing immediate rash, facial/tongue/throat swelling, SOB or lightheadedness with hypotension: Yes Has patient had a PCN reaction causing severe rash involving mucus membranes or skin necrosis: No Has patient had a PCN reaction that required hospitalization: No Has patient had a PCN reaction occurring within the last 10 years: No If all of the above answers are "NO", then may proceed with Cephalosporin use. Has patient had a PCN reaction causing immediate rash, facial/tongue/throat swelling, SOB or lightheadedness with hypotension: Yes Has patient had a PCN reaction causing severe rash involving mucus membranes or skin necrosis: No Has patient had a PCN reaction that required hospitalization: No Has patient had a PCN reaction occurring within the last 10 years: No If all of the above answers are "NO", then may proceed with Cephalosporin use. Has patient had a PCN reaction causing immediate rash, facial/tongue/throat swelling, SOB or lightheadedness with hypotension: Yes Has patient had a PCN reaction causing severe rash involving mucus membranes or skin necrosis: No Has patient had a PCN reaction that required hospitalization: No Has patient had a PCN reaction occurring within the last 10 years: NoIf all of the above answers are "NO", then may proceed with Cephalosporin use. Has patient had a PCN reaction causing immediate rash, facial/tongue/throat swelling, SOB  or lightheadedness with hypotension: Yes Has patient had a PCN reaction causing se... (TRUNCATED)   Sulfa Antibiotics Shortness Of Breath    REVIEW OF SYSTEMS:   ROS As per history of present illness. All pertinent systems were reviewed above. Constitutional, HEENT, cardiovascular, respiratory, GI, GU, musculoskeletal, neuro, psychiatric, endocrine, integumentary and hematologic systems were reviewed and are otherwise negative/unremarkable except for positive findings mentioned above in the HPI.   MEDICATIONS AT HOME:   Prior to Admission medications   Medication Sig Start Date End Date Taking? Authorizing Provider  albuterol (PROVENTIL HFA;VENTOLIN HFA) 108 (90 Base) MCG/ACT inhaler Inhale 2 puffs into the lungs every 4 (four) hours as needed for wheezing or shortness of breath.    [provider]  amLODipine (NORVASC) 10 MG tablet Take 10 mg by mouth daily.    [provider]  aspirin 81 MG chewable tablet Chew 81 mg by mouth daily.     [provider]  atorvastatin (LIPITOR) 80 MG tablet Take 80  mg by mouth every evening.     [provider]  cetirizine (ZYRTEC) 10 MG tablet Take 10 mg by mouth every evening.     [provider]  FEROSUL 325 (65 Fe) MG tablet Take 325 mg by mouth 2 (two) times daily. 06/15/22   [provider]  fluticasone (FLONASE) 50 MCG/ACT nasal spray Place 2 sprays into the nose every evening.     [provider]  Fluticasone-Umeclidin-Vilant (TRELEGY ELLIPTA) 100-62.5-25 MCG/INH AEPB Inhale 1 puff into the lungs daily. 11/08/19   [provider]  furosemide (LASIX) 20 MG tablet Take 20 mg by mouth daily as needed for fluid. 11/12/20   [provider]  gabapentin (NEURONTIN) 300 MG capsule Take 300 mg by mouth 3 (three) times daily.     [provider]  hydrALAZINE (APRESOLINE) 25 MG tablet Take 25 mg by mouth in the morning and at bedtime. 11/12/22 11/07/23  [provider]  ipratropium-albuterol (DUONEB) 0.5-2.5 (3) MG/3ML SOLN Take 3 mLs by nebulization every 6 (six) hours as needed. Patient taking differently: Take 3 mLs by nebulization in the morning and at bedtime. 06/28/18   Houston Siren, MD  lisinopril (ZESTRIL) 40 MG tablet Take 40 mg by mouth daily. 07/04/22   [provider]  magnesium oxide (MAG-OX) 400 (241.3 Mg) MG tablet Take 1 tablet (400 mg total) by mouth daily. 04/29/18   Katha Hamming, MD  metFORMIN (GLUCOPHAGE) 500 MG tablet Take 1,000 mg by mouth 2 (two) times daily. 07/04/22   [provider]  Multiple Vitamins-Minerals (MULTIVITAMIN WITH MINERALS) tablet Take 1 tablet by mouth daily.    [provider]  omeprazole (PRILOSEC) 40 MG capsule Take 40 mg by mouth every evening.    [provider]  sertraline (ZOLOFT) 25 MG tablet Take 50 mg by mouth in the morning and at bedtime. 12/26/20   [provider]  sitaGLIPtin (JANUVIA) 50 MG tablet Take 50 mg by mouth daily. 06/08/19   [provider]  tamsulosin (FLOMAX) 0.4 MG CAPS capsule Take 1 capsule (0.4 mg total) by mouth daily after supper. 11/13/22   Sondra Come, MD  VOLTAREN 1 % GEL  02/08/21   [provider]      VITAL SIGNS:  Blood pressure 96/69, pulse (!) 59, temperature 98.9 F (37.2 C), temperature source Oral, resp. rate (!) 21, height 5\' 7"  (1.702 m), weight 79.4 kg, SpO2 99%.  PHYSICAL EXAMINATION:  Physical Exam  GENERAL:  89 y.o.-year-old, male patient lying in the bed with respiratory distress with conversational dyspnea. EYES: Pupils equal, round, reactive to light and accommodation. No scleral icterus. Extraocular muscles intact.  HEENT: Head atraumatic, normocephalic. Oropharynx and nasopharynx clear.  NECK:  Supple, no jugular venous distention. No thyroid enlargement, no tenderness.  LUNGS: Diminished right midlung zone breath sounds with associated crackles.. No use of accessory muscles of  respiration.  CARDIOVASCULAR: Regular rate and rhythm, S1, S2 normal. No murmurs, rubs, or gallops.  ABDOMEN: Soft, nondistended, nontender. Bowel sounds present. No organomegaly or mass.  EXTREMITIES: No pedal edema, cyanosis, or clubbing.  NEUROLOGIC: Cranial nerves II through XII are intact. Muscle strength 5/5 in all extremities. Sensation intact. Gait not checked.  PSYCHIATRIC: The patient is alert and oriented x 3.  Normal affect and good eye contact. SKIN: No obvious rash, lesion, or ulcer.   LABORATORY PANEL:   CBC Recent Labs  Lab 11/03/23 0514  WBC 21.3*  HGB 8.5*  HCT 27.0*  PLT 150   ------------------------------------------------------------------------------------------------------------------  Chemistries  Recent Labs  Lab 11/02/23 2136 11/03/23 0514  NA 137 135  K 4.8 4.9  CL 105 106  CO2 23 21*  GLUCOSE 91 72  BUN 39* 42*  CREATININE 1.62* 1.90*  CALCIUM 9.2 8.6*  AST 23  --   ALT 16  --   ALKPHOS 38  --   BILITOT 0.6  --    ------------------------------------------------------------------------------------------------------------------  Cardiac Enzymes No results for input(s): "TROPONINI" in the last 168 hours. ------------------------------------------------------------------------------------------------------------------  RADIOLOGY:  CT Head Wo Contrast  Result Date: 11/03/2023 CLINICAL DATA:  FALL EXAM: CT HEAD WITHOUT CONTRAST CT CERVICAL SPINE WITHOUT CONTRAST TECHNIQUE: Multidetector CT imaging of the head and cervical spine was performed following the standard protocol without intravenous contrast. Multiplanar CT image reconstructions of the cervical spine were also generated. RADIATION DOSE REDUCTION: This exam was performed according to the departmental dose-optimization program which includes automated exposure control, adjustment of the mA and/or kV according to patient size and/or use of iterative reconstruction technique. COMPARISON:   None Available. FINDINGS: CT HEAD FINDINGS Brain: There is no mass, hemorrhage or extra-axial collection. The size and configuration of the ventricles and extra-axial CSF spaces are normal. The brain parenchyma is normal, without evidence of acute or chronic infarction. Vascular: No abnormal hyperdensity of the major intracranial arteries or dural venous sinuses. No intracranial atherosclerosis. Skull: The visualized skull base, calvarium and extracranial soft tissues are normal. Sinuses/Orbits: No fluid levels or advanced mucosal thickening of the visualized paranasal sinuses. No mastoid or middle ear effusion. The orbits are normal. CT CERVICAL SPINE FINDINGS Alignment: No acute subluxation. Facets are aligned. Occipital condyles are normally positioned. Skull base and vertebrae: No acute fracture. Soft tissues and spinal canal: No prevertebral fluid or swelling. No visible canal hematoma. Disc levels: No advanced spinal canal or neural foraminal stenosis. Upper chest: No pneumothorax, pulmonary nodule or pleural effusion. Emphysema. Other: Left carotid stent IMPRESSION: 1. No acute intracranial abnormality. 2. No acute fracture or subluxation of the cervical spine. Emphysema (ICD10-J43.9). Electronically Signed   By: Deatra Robinson M.D.   On: 11/03/2023 00:36   CT Cervical Spine Wo Contrast  Result Date: 11/03/2023 CLINICAL DATA:  FALL EXAM: CT HEAD WITHOUT CONTRAST CT CERVICAL SPINE WITHOUT CONTRAST TECHNIQUE: Multidetector CT imaging of the head and cervical spine was performed following the standard protocol without intravenous contrast. Multiplanar CT image reconstructions of the cervical spine were also generated. RADIATION DOSE REDUCTION: This exam was performed according to the departmental dose-optimization program which includes automated exposure control, adjustment of the mA and/or kV according to patient size and/or use of iterative reconstruction technique. COMPARISON:  None Available. FINDINGS:  CT HEAD FINDINGS Brain: There is no mass, hemorrhage or extra-axial collection. The size and configuration of the ventricles and extra-axial CSF spaces are normal. The brain parenchyma is normal, without evidence of acute or chronic infarction. Vascular: No abnormal hyperdensity of the major intracranial arteries or dural venous sinuses. No intracranial atherosclerosis. Skull: The visualized skull base, calvarium and extracranial soft tissues are normal. Sinuses/Orbits: No fluid levels or advanced mucosal thickening of the visualized paranasal sinuses. No mastoid or middle ear effusion. The orbits are normal. CT CERVICAL SPINE FINDINGS Alignment: No acute subluxation. Facets are aligned. Occipital condyles are normally positioned. Skull base and vertebrae: No acute fracture. Soft tissues and spinal canal: No prevertebral fluid or swelling. No visible canal hematoma. Disc levels: No advanced spinal canal or neural foraminal stenosis. Upper chest: No pneumothorax, pulmonary nodule or pleural effusion. Emphysema. Other:  Left carotid stent IMPRESSION: 1. No acute intracranial abnormality. 2. No acute fracture or subluxation of the cervical spine. Emphysema (ICD10-J43.9). Electronically Signed   By: Deatra Robinson M.D.   On: 11/03/2023 00:36   DG Chest Port 1 View  Result Date: 11/03/2023 CLINICAL DATA:  Possible sepsis EXAM: PORTABLE CHEST 1 VIEW COMPARISON:  02/26/2021 FINDINGS: Heterogeneous right upper lobe airspace opacity. Streaky left lung base opacity. Normal cardiac size with aortic atherosclerosis IMPRESSION: Heterogeneous right upper lobe airspace opacity suspicious for pneumonia. Radiographic follow-up to resolution is recommended. Electronically Signed   By: Jasmine Pang M.D.   On: 11/03/2023 00:10      IMPRESSION AND PLAN:  Assessment and Plan: * Sepsis due to pneumonia [facility]) - This is likely secondary to right-sided pneumonia. - The patient will be admitted to a medical telemetry bed. - Will  continue antibiotic therapy with IV Rocephin and Zithromax. - Mucolytic therapy be provided as well as duo nebs q.i.d. and q.4 hours p.r.n. - We will follow blood cultures. - The patient will be hydrated with IV lactated ringer. - Sepsis is met by leukocytosis and tachypnea.  He may be having severe sepsis based on associated hypotension with brief MAP of 50 and hypoxia.  Acute respiratory failure with hypoxia (HCC) - This is clearly secondary to #1. - O2 protocol will be followed. - Management otherwise as above.  Dyslipidemia - We will continue statin therapy.  Type 2 diabetes mellitus with peripheral neuropathy (HCC) - The patient will be placed on supplemental coverage with NovoLog. - We will hold off metformin and resume Januvia. - We will continue Neurontin.  BPH (benign prostatic hyperplasia) - We will continue Flomax.  Depression - We will continue Zoloft.  Essential hypertension - We will hold off antihypertensive therapy with current hypotension.    DVT prophylaxis: Lovenox.  Advanced Care Planning:  Code Status: full code.  Family Communication:  The plan of care was discussed in details with the patient (and family). I answered all questions. The patient agreed to proceed with the above mentioned plan. Further management will depend upon hospital course. Disposition Plan: Back to previous home environment Consults called: none.  All the records are reviewed and case discussed with ED provider.  Status is: Inpatient  At the time of the admission, it appears that the appropriate admission status for this patient is inpatient.  This is judged to be reasonable and necessary in order to provide the required intensity of service to ensure the patient's safety given the presenting symptoms, physical exam findings and initial radiographic and laboratory data in the context of comorbid conditions.  The patient requires inpatient status due to high intensity of service, high  risk of further deterioration and high frequency of surveillance required.  I certify that at the time of admission, it is my clinical judgment that the patient will require inpatient hospital care extending more than 2 midnights.                            Dispo: The patient is from: Home              Anticipated d/c is to: Home              Patient currently is not medically stable to d/c.              Difficult to place patient: No  Hannah Beat M.D on 11/03/2023 at 5:51 AM  [facility]   From 7 PM-7 AM, contact night-coverage [email or web address]  CC: Primary care physician; Abram Sander, MD

## 2023-11-03 NOTE — ED Notes (Addendum)
Pts bp was noted to be 86/48 with a map of 73 on his right arm, cuff moved to left pressure was 89/63 map of 61.  Pt is A&O, nad

## 2023-11-03 NOTE — Assessment & Plan Note (Signed)
-   We will continue statin therapy. 

## 2023-11-04 DIAGNOSIS — A419 Sepsis, unspecified organism: Secondary | ICD-10-CM

## 2023-11-04 DIAGNOSIS — J189 Pneumonia, unspecified organism: Secondary | ICD-10-CM | POA: Diagnosis not present

## 2023-11-04 LAB — BASIC METABOLIC PANEL
Anion gap: 4 — ABNORMAL LOW (ref 5–15)
BUN: 48 mg/dL — ABNORMAL HIGH (ref 8–23)
CO2: 23 mmol/L (ref 22–32)
Calcium: 8.4 mg/dL — ABNORMAL LOW (ref 8.9–10.3)
Chloride: 108 mmol/L (ref 98–111)
Creatinine, Ser: 1.93 mg/dL — ABNORMAL HIGH (ref 0.61–1.24)
GFR, Estimated: 33 mL/min — ABNORMAL LOW (ref >60)
Glucose, Bld: 130 mg/dL — ABNORMAL HIGH (ref 70–99)
Potassium: 5.3 mmol/L — ABNORMAL HIGH (ref 3.5–5.1)
Sodium: 135 mmol/L (ref 135–145)

## 2023-11-04 LAB — CBC
HCT: 23.3 % — ABNORMAL LOW (ref 39.0–52.0)
Hemoglobin: 7.6 g/dL — ABNORMAL LOW (ref 13.0–17.0)
MCH: 30.8 pg (ref 26.0–34.0)
MCHC: 32.6 g/dL (ref 30.0–36.0)
MCV: 94.3 fL (ref 80.0–100.0)
Platelets: 141 10*3/uL — ABNORMAL LOW (ref 150–400)
RBC: 2.47 MIL/uL — ABNORMAL LOW (ref 4.22–5.81)
RDW: 14 % (ref 11.5–15.5)
WBC: 20.1 10*3/uL — ABNORMAL HIGH (ref 4.0–10.5)
nRBC: 0 % (ref 0.0–0.2)

## 2023-11-04 LAB — CK: Total CK: 208 U/L (ref 49–397)

## 2023-11-04 MED ORDER — IPRATROPIUM-ALBUTEROL 0.5-2.5 (3) MG/3ML IN SOLN
3.0000 mL | RESPIRATORY_TRACT | Status: DC | PRN
  Administered 2023-11-04: 3 mL via RESPIRATORY_TRACT
  Filled 2023-11-04: qty 3

## 2023-11-04 MED ORDER — SODIUM ZIRCONIUM CYCLOSILICATE 10 G PO PACK
10.0000 g | PACK | Freq: Once | ORAL | Status: AC
  Administered 2023-11-04: 10 g via ORAL
  Filled 2023-11-04: qty 1

## 2023-11-04 NOTE — TOC Progression Note (Signed)
Transition of Care [facility]) - Progression Note    Patient Details  Name: Francisco Gibbs MRN: [id number] Date of Birth: 1933/03/12  Transition of Care [facility]) CM/SW Contact  Truddie Hidden, RN Phone Number: 11/04/2023, 4:06 PM  Clinical Narrative:    TOC continuing to follow patient's progress throughout discharge planning.        Expected Discharge Plan and Services                                               Social Determinants of Health (SDOH) Interventions SDOH Screenings   Food Insecurity: No Food Insecurity (06/15/2023)   Received from [facility]  Transportation Needs: No Transportation Needs (06/15/2023)   Received from [facility]  Utilities: Not At Risk (06/15/2023)   Received from [facility]  Financial Resource Strain: Low Risk  (06/15/2023)   Received from [facility]  Tobacco Use: Medium Risk (09/28/2023)   Received from [facility]    Readmission Risk Interventions    11/03/2023    9:46 AM  Readmission Risk Prevention Plan  Transportation Screening Complete  PCP or Specialist Appt within 3-5 Days Complete  HRI or Home Care Consult Complete  Social Work Consult for Recovery Care Planning/Counseling Complete  Palliative Care Screening Not Applicable  Medication Review [facility]) Not Complete  Med Review Comments Patient will review medication with MD upon discharge

## 2023-11-04 NOTE — Plan of Care (Signed)

## 2023-11-04 NOTE — Progress Notes (Addendum)
Progress Note    Francisco Gibbs  MVH:[id number] DOB: September 10, 1933  DOA: 11/02/2023 PCP: Abram Sander, MD      Brief Narrative:    Medical records reviewed and are as summarized below:  Francisco Gibbs is a 89 y.o. male  with medical history significant for COPD, GERD, hypertension, dyslipidemia, type 2 diabetes mellitus, aortic stenosis, who presented to the hospital because of generalized weakness and fall.  He lost his balance and fell backward striking his head on the wall.  He was unable to stand up after the fall.  He laid on the ground for about 2 hours until his daughter came home and found him on the floor.  He also complained of cough productive of whitish sputum, shortness of breath and wheezing.         Assessment/Plan:   Principal Problem:   Sepsis due to pneumonia [facility]) Active Problems:   Acute respiratory failure with hypoxia (HCC)   Dyslipidemia   Type 2 diabetes mellitus with peripheral neuropathy (HCC)   Essential hypertension   Depression   BPH (benign prostatic hyperplasia)    Body mass index is 27.41 kg/m.   Sepsis secondary to pneumonia: Continue empiric IV antibiotics.  Incentive spirometry as needed. Leukocytosis likely from infection   Acute hypoxemic respiratory failure: Continue 3 L/min oxygen via [place].  Wean off oxygen as able.   CKD stage 3b: Creatinine is stable Hyperkalemia: Treat with Lokelma.   General weakness: PT evaluation   Acute on chronic anemia: Drop in hemoglobin from 9.4-7.6.  Part of this may be from hemodilution.  Monitor H&H.   Comorbidities include hypertension, hyperlipidemia, type 2 diabetes mellitus, BPH, mild to moderate aortic stenosis, depression    Diet Order             Diet Heart Room service appropriate? Yes; Fluid consistency: Thin  Diet effective now                            Consultants: None  Procedures: None    Medications:    aspirin  81 mg Oral Daily    atorvastatin  80 mg Oral QPM   enoxaparin (LOVENOX) injection  30 mg Subcutaneous Q24H   ferrous sulfate  325 mg Oral BID WC   fluticasone  2 spray Each Nare QPM   fluticasone furoate-vilanterol  1 puff Inhalation Daily   And   umeclidinium bromide  1 puff Inhalation Daily   gabapentin  300 mg Oral TID   guaiFENesin  600 mg Oral BID   loratadine  10 mg Oral Daily   magnesium oxide  400 mg Oral Daily   multivitamin with minerals  1 tablet Oral Daily   pantoprazole  40 mg Oral Daily   sertraline  50 mg Oral BID   sodium zirconium cyclosilicate  10 g Oral Once   tamsulosin  0.4 mg Oral QPC supper   Continuous Infusions:  azithromycin 500 mg (11/04/23 0538)   cefTRIAXone (ROCEPHIN)  IV 2 g (11/04/23 0914)     Anti-infectives (From admission, onward)    Start     Dose/Rate Route Frequency Ordered Stop   11/04/23 0600  azithromycin (ZITHROMAX) 500 mg in dextrose 5 % 250 mL IVPB        500 mg 255 mL/hr over 60 Minutes Intravenous Every 24 hours 11/03/23 2321     11/03/23 0800  cefTRIAXone (ROCEPHIN) 2 g in sodium chloride 0.9 %  100 mL IVPB        2 g 200 mL/hr over 30 Minutes Intravenous Every 24 hours 11/03/23 0222 11/08/23 0759   11/03/23 0800  azithromycin (ZITHROMAX) 500 mg in sodium chloride 0.9 % 250 mL IVPB  Status:  Discontinued        500 mg 250 mL/hr over 60 Minutes Intravenous Every 24 hours 11/03/23 0222 11/03/23 2321   11/02/23 2145  vancomycin (VANCOREADY) IVPB 1750 mg/350 mL        1,750 mg 175 mL/hr over 120 Minutes Intravenous Every 24 hours 11/02/23 2130 11/03/23 0244   11/02/23 2130  ceFEPIme (MAXIPIME) 2 g in sodium chloride 0.9 % 100 mL IVPB        2 g 200 mL/hr over 30 Minutes Intravenous  Once 11/02/23 2128 11/02/23 2234   11/02/23 2130  metroNIDAZOLE (FLAGYL) IVPB 500 mg        500 mg 100 mL/hr over 60 Minutes Intravenous  Once 11/02/23 2128 11/03/23 0025   11/02/23 2130  vancomycin (VANCOCIN) IVPB 1000 mg/200 mL premix  Status:  Discontinued        1,000  mg 200 mL/hr over 60 Minutes Intravenous  Once 11/02/23 2128 11/02/23 2130              Family Communication/Anticipated D/C date and plan/Code Status   DVT prophylaxis: enoxaparin (LOVENOX) injection 30 mg Start: 11/04/23 0800     Code Status: Full Code  Family Communication: None Disposition Plan: Plan to discharge home   Status is: Inpatient Remains inpatient appropriate because: Sepsis from pneumonia       Subjective:   Interval events noted.  He complains of cough and general weakness.  Breathing is a little better.  Objective:    Vitals:   11/04/23 0400 11/04/23 0734 11/04/23 1223 11/04/23 1552  BP: (!) 96/42 (!) 107/52 (!) 127/55 (!) 104/51  Pulse: 92 89 83 73  Resp:  16 16 16   Temp: 98.2 F (36.8 C) 98.1 F (36.7 C) 98 F (36.7 C)   TempSrc: Oral     SpO2: (!) 86% 96% 100% 92%  Weight:      Height:       No data found.   Intake/Output Summary (Last 24 hours) at 11/04/2023 1703 Last data filed at 11/04/2023 1450 Gross per 24 hour  Intake 603.65 ml  Output 1200 ml  Net -596.35 ml   Filed Weights   11/02/23 2126  Weight: 79.4 kg    Exam:  GEN: NAD SKIN: Warm and dry EYES: No pallor or icterus ENT: MMM CV: RRR PULM: Bilateral rhonchi ABD: soft, ND, NT, +BS CNS: AAO x 3, non focal EXT: No edema or tenderness        Data Reviewed:   I have personally reviewed following labs and imaging studies:  Labs: Labs show the following:   Basic Metabolic Panel: Recent Labs  Lab 11/02/23 2136 11/03/23 0514 11/04/23 0525  NA 137 135 135  K 4.8 4.9 5.3*  CL 105 106 108  CO2 23 21* 23  GLUCOSE 91 72 130*  BUN 39* 42* 48*  CREATININE 1.62* 1.90* 1.93*  CALCIUM 9.2 8.6* 8.4*   GFR Estimated Creatinine Clearance: 26.7 mL/min (A) (by C-G formula based on SCr of 1.93 mg/dL (H)). Liver Function Tests: Recent Labs  Lab 11/02/23 2136  AST 23  ALT 16  ALKPHOS 38  BILITOT 0.6  PROT 6.2*  ALBUMIN 3.9   No results for  input(s): "LIPASE", "AMYLASE" in the  last 168 hours. No results for input(s): "AMMONIA" in the last 168 hours. Coagulation profile Recent Labs  Lab 11/02/23 2136 11/03/23 0514  INR 1.2 1.4*    CBC: Recent Labs  Lab 11/02/23 2136 11/03/23 0514 11/04/23 0525  WBC 25.5* 21.3* 20.1*  NEUTROABS 22.4*  --   --   HGB 9.4* 8.5* 7.6*  HCT 29.1* 27.0* 23.3*  MCV 95.1 96.8 94.3  PLT 195 150 141*   Cardiac Enzymes: No results for input(s): "CKTOTAL", "CKMB", "CKMBINDEX", "TROPONINI" in the last 168 hours. BNP (last 3 results) No results for input(s): "PROBNP" in the last 8760 hours. CBG: No results for input(s): "GLUCAP" in the last 168 hours. D-Dimer: No results for input(s): "DDIMER" in the last 72 hours. Hgb A1c: No results for input(s): "HGBA1C" in the last 72 hours. Lipid Profile: No results for input(s): "CHOL", "HDL", "LDLCALC", "TRIG", "CHOLHDL", "LDLDIRECT" in the last 72 hours. Thyroid function studies: No results for input(s): "TSH", "T4TOTAL", "T3FREE", "THYROIDAB" in the last 72 hours.  Invalid input(s): "FREET3" Anemia work up: No results for input(s): "VITAMINB12", "FOLATE", "FERRITIN", "TIBC", "IRON", "RETICCTPCT" in the last 72 hours. Sepsis Labs: Recent Labs  Lab 11/02/23 2136 11/02/23 2334 11/03/23 0514 11/04/23 0525  PROCALCITON  --   --  4.72  --   WBC 25.5*  --  21.3* 20.1*  LATICACIDVEN 1.4 1.4  --   --     Microbiology Recent Results (from the past 240 hour(s))  Resp panel by RT-PCR (RSV, Flu A&B, Covid) Anterior Nasal Swab     Status: None   Collection Time: 11/02/23  9:37 PM   Specimen: Anterior Nasal Swab  Result Value Ref Range Status   SARS Coronavirus 2 by RT PCR NEGATIVE NEGATIVE Final    Comment: (NOTE) SARS-CoV-2 target nucleic acids are NOT DETECTED.  The SARS-CoV-2 RNA is generally detectable in upper respiratory specimens during the acute phase of infection. The lowest concentration of SARS-CoV-2 viral copies this assay can  detect is 138 copies/mL. A negative result does not preclude SARS-Cov-2 infection and should not be used as the sole basis for treatment or other patient management decisions. A negative result may occur with  improper specimen collection/handling, submission of specimen other than nasopharyngeal swab, presence of viral mutation(s) within the areas targeted by this assay, and inadequate number of viral copies(<138 copies/mL). A negative result must be combined with clinical observations, patient history, and epidemiological information. The expected result is Negative.  Fact Sheet for Patients:  BloggerCourse.com  Fact Sheet for Healthcare Providers:  SeriousBroker.it  This test is no t yet approved or cleared by the [place] FDA and  has been authorized for detection and/or diagnosis of SARS-CoV-2 by FDA under an Emergency Use Authorization (EUA). This EUA will remain  in effect (meaning this test can be used) for the duration of the COVID-19 declaration under Section 564(b)(1) of the Act, 21 U.S.C.section 360bbb-3(b)(1), unless the authorization is terminated  or revoked sooner.       Influenza A by PCR NEGATIVE NEGATIVE Final   Influenza B by PCR NEGATIVE NEGATIVE Final    Comment: (NOTE) The Xpert Xpress SARS-CoV-2/FLU/RSV plus assay is intended as an aid in the diagnosis of influenza from Nasopharyngeal swab specimens and should not be used as a sole basis for treatment. Nasal washings and aspirates are unacceptable for Xpert Xpress SARS-CoV-2/FLU/RSV testing.  Fact Sheet for Patients: BloggerCourse.com  Fact Sheet for Healthcare Providers: SeriousBroker.it  This test is not yet approved or cleared by  the [employer] and has been authorized for detection and/or diagnosis of SARS-CoV-2 by FDA under an Emergency Use Authorization (EUA). This EUA will remain in  effect (meaning this test can be used) for the duration of the COVID-19 declaration under Section 564(b)(1) of the Act, 21 U.S.C. section 360bbb-3(b)(1), unless the authorization is terminated or revoked.     Resp Syncytial Virus by PCR NEGATIVE NEGATIVE Final    Comment: (NOTE) Fact Sheet for Patients: BloggerCourse.com  Fact Sheet for Healthcare Providers: SeriousBroker.it  This test is not yet approved or cleared by the [place] FDA and has been authorized for detection and/or diagnosis of SARS-CoV-2 by FDA under an Emergency Use Authorization (EUA). This EUA will remain in effect (meaning this test can be used) for the duration of the COVID-19 declaration under Section 564(b)(1) of the Act, 21 U.S.C. section 360bbb-3(b)(1), unless the authorization is terminated or revoked.  Performed at [facility], [address] [place], [place] [address]   Blood Culture (routine x 2)     Status: None (Preliminary result)   Collection Time: 11/02/23  9:37 PM   Specimen: BLOOD  Result Value Ref Range Status   Specimen Description BLOOD BLOOD LEFT ARM  Final   Special Requests   Final    BOTTLES DRAWN AEROBIC AND ANAEROBIC Blood Culture adequate volume   Culture   Final    NO GROWTH 2 DAYS Performed at [facility], [address] [place], [place] [address]    Report Status PENDING  Incomplete  Blood Culture (routine x 2)     Status: None (Preliminary result)   Collection Time: 11/02/23  9:37 PM   Specimen: BLOOD  Result Value Ref Range Status   Specimen Description BLOOD BLOOD RIGHT ARM  Final   Special Requests   Final    BOTTLES DRAWN AEROBIC AND ANAEROBIC Blood Culture adequate volume   Culture   Final    NO GROWTH 2 DAYS Performed at [facility], [address] [place], [place] [address]    Report Status PENDING  Incomplete    Procedures and diagnostic studies:  CT Head Wo  Contrast  Result Date: 11/03/2023 CLINICAL DATA:  FALL EXAM: CT HEAD WITHOUT CONTRAST CT CERVICAL SPINE WITHOUT CONTRAST TECHNIQUE: Multidetector CT imaging of the head and cervical spine was performed following the standard protocol without intravenous contrast. Multiplanar CT image reconstructions of the cervical spine were also generated. RADIATION DOSE REDUCTION: This exam was performed according to the departmental dose-optimization program which includes automated exposure control, adjustment of the mA and/or kV according to patient size and/or use of iterative reconstruction technique. COMPARISON:  None Available. FINDINGS: CT HEAD FINDINGS Brain: There is no mass, hemorrhage or extra-axial collection. The size and configuration of the ventricles and extra-axial CSF spaces are normal. The brain parenchyma is normal, without evidence of acute or chronic infarction. Vascular: No abnormal hyperdensity of the major intracranial arteries or dural venous sinuses. No intracranial atherosclerosis. Skull: The visualized skull base, calvarium and extracranial soft tissues are normal. Sinuses/Orbits: No fluid levels or advanced mucosal thickening of the visualized paranasal sinuses. No mastoid or middle ear effusion. The orbits are normal. CT CERVICAL SPINE FINDINGS Alignment: No acute subluxation. Facets are aligned. Occipital condyles are normally positioned. Skull base and vertebrae: No acute fracture. Soft tissues and spinal canal: No prevertebral fluid or swelling. No visible canal hematoma. Disc levels: No advanced spinal canal or neural foraminal stenosis. Upper chest: No pneumothorax, pulmonary nodule or  pleural effusion. Emphysema. Other: Left carotid stent IMPRESSION: 1. No acute intracranial abnormality. 2. No acute fracture or subluxation of the cervical spine. Emphysema (ICD10-J43.9). Electronically Signed   By: Deatra Robinson M.D.   On: 11/03/2023 00:36   CT Cervical Spine Wo Contrast  Result Date:  11/03/2023 CLINICAL DATA:  FALL EXAM: CT HEAD WITHOUT CONTRAST CT CERVICAL SPINE WITHOUT CONTRAST TECHNIQUE: Multidetector CT imaging of the head and cervical spine was performed following the standard protocol without intravenous contrast. Multiplanar CT image reconstructions of the cervical spine were also generated. RADIATION DOSE REDUCTION: This exam was performed according to the departmental dose-optimization program which includes automated exposure control, adjustment of the mA and/or kV according to patient size and/or use of iterative reconstruction technique. COMPARISON:  None Available. FINDINGS: CT HEAD FINDINGS Brain: There is no mass, hemorrhage or extra-axial collection. The size and configuration of the ventricles and extra-axial CSF spaces are normal. The brain parenchyma is normal, without evidence of acute or chronic infarction. Vascular: No abnormal hyperdensity of the major intracranial arteries or dural venous sinuses. No intracranial atherosclerosis. Skull: The visualized skull base, calvarium and extracranial soft tissues are normal. Sinuses/Orbits: No fluid levels or advanced mucosal thickening of the visualized paranasal sinuses. No mastoid or middle ear effusion. The orbits are normal. CT CERVICAL SPINE FINDINGS Alignment: No acute subluxation. Facets are aligned. Occipital condyles are normally positioned. Skull base and vertebrae: No acute fracture. Soft tissues and spinal canal: No prevertebral fluid or swelling. No visible canal hematoma. Disc levels: No advanced spinal canal or neural foraminal stenosis. Upper chest: No pneumothorax, pulmonary nodule or pleural effusion. Emphysema. Other: Left carotid stent IMPRESSION: 1. No acute intracranial abnormality. 2. No acute fracture or subluxation of the cervical spine. Emphysema (ICD10-J43.9). Electronically Signed   By: Deatra Robinson M.D.   On: 11/03/2023 00:36   DG Chest Port 1 View  Result Date: 11/03/2023 CLINICAL DATA:  Possible  sepsis EXAM: PORTABLE CHEST 1 VIEW COMPARISON:  02/26/2021 FINDINGS: Heterogeneous right upper lobe airspace opacity. Streaky left lung base opacity. Normal cardiac size with aortic atherosclerosis IMPRESSION: Heterogeneous right upper lobe airspace opacity suspicious for pneumonia. Radiographic follow-up to resolution is recommended. Electronically Signed   By: Jasmine Pang M.D.   On: 11/03/2023 00:10               LOS: 1 day   Damond Borchers  [facility]   Pager on [email or web address]. If 7PM-7AM, please contact night-coverage at [email or web address]     11/04/2023, 5:03 PM

## 2023-11-04 NOTE — Plan of Care (Signed)
  Problem: Fluid Volume: Goal: Hemodynamic stability will improve Outcome: Progressing   Problem: Respiratory: Goal: Ability to maintain adequate ventilation will improve Outcome: Progressing   

## 2023-11-05 ENCOUNTER — Ambulatory Visit: Payer: Medicare Other | Admitting: Urology

## 2023-11-05 DIAGNOSIS — J189 Pneumonia, unspecified organism: Secondary | ICD-10-CM | POA: Diagnosis not present

## 2023-11-05 DIAGNOSIS — A419 Sepsis, unspecified organism: Secondary | ICD-10-CM | POA: Diagnosis not present

## 2023-11-05 LAB — CBC WITH DIFFERENTIAL/PLATELET
Abs Immature Granulocytes: 0.62 10*3/uL — ABNORMAL HIGH (ref 0.00–0.07)
Basophils Absolute: 0 10*3/uL (ref 0.0–0.1)
Basophils Relative: 0 %
Eosinophils Absolute: 0 10*3/uL (ref 0.0–0.5)
Eosinophils Relative: 0 %
HCT: 24.9 % — ABNORMAL LOW (ref 39.0–52.0)
Hemoglobin: 7.9 g/dL — ABNORMAL LOW (ref 13.0–17.0)
Immature Granulocytes: 4 %
Lymphocytes Relative: 4 %
Lymphs Abs: 0.7 10*3/uL (ref 0.7–4.0)
MCH: 30.3 pg (ref 26.0–34.0)
MCHC: 31.7 g/dL (ref 30.0–36.0)
MCV: 95.4 fL (ref 80.0–100.0)
Monocytes Absolute: 0.9 10*3/uL (ref 0.1–1.0)
Monocytes Relative: 5 %
Neutro Abs: 14.5 10*3/uL — ABNORMAL HIGH (ref 1.7–7.7)
Neutrophils Relative %: 87 %
Platelets: 141 10*3/uL — ABNORMAL LOW (ref 150–400)
RBC: 2.61 MIL/uL — ABNORMAL LOW (ref 4.22–5.81)
RDW: 13.8 % (ref 11.5–15.5)
WBC: 16.8 10*3/uL — ABNORMAL HIGH (ref 4.0–10.5)
nRBC: 0 % (ref 0.0–0.2)

## 2023-11-05 LAB — BASIC METABOLIC PANEL
Anion gap: 6 (ref 5–15)
BUN: 42 mg/dL — ABNORMAL HIGH (ref 8–23)
CO2: 24 mmol/L (ref 22–32)
Calcium: 8.8 mg/dL — ABNORMAL LOW (ref 8.9–10.3)
Chloride: 105 mmol/L (ref 98–111)
Creatinine, Ser: 1.55 mg/dL — ABNORMAL HIGH (ref 0.61–1.24)
GFR, Estimated: 43 mL/min — ABNORMAL LOW (ref >60)
Glucose, Bld: 157 mg/dL — ABNORMAL HIGH (ref 70–99)
Potassium: 5.4 mmol/L — ABNORMAL HIGH (ref 3.5–5.1)
Sodium: 135 mmol/L (ref 135–145)

## 2023-11-05 MED ORDER — SODIUM ZIRCONIUM CYCLOSILICATE 10 G PO PACK
10.0000 g | PACK | Freq: Two times a day (BID) | ORAL | Status: AC
  Administered 2023-11-05: 10 g via ORAL
  Filled 2023-11-05: qty 1

## 2023-11-05 MED ORDER — SODIUM ZIRCONIUM CYCLOSILICATE 10 G PO PACK: 10.0000 g | PACK | Freq: Two times a day (BID) | ORAL | Status: DC

## 2023-11-05 NOTE — Progress Notes (Signed)
Progress Note    Francisco Gibbs  WGN:[id number] DOB: [birth year]  DOA: 11/02/2023 PCP: Abram Sander, MD      Brief Narrative:    Medical records reviewed and are as summarized below:  Francisco Gibbs is a 89 y.o. male  with medical history significant for COPD, GERD, hypertension, dyslipidemia, type 2 diabetes mellitus, aortic stenosis, who presented to the hospital because of generalized weakness and fall.  He lost his balance and fell backward striking his head on the wall.  He was unable to stand up after the fall.  He laid on the ground for about 2 hours until his daughter came home and found him on the floor.  He also complained of cough productive of whitish sputum, shortness of breath and wheezing.         Assessment/Plan:   Principal Problem:   Sepsis due to pneumonia [facility]) Active Problems:   Acute respiratory failure with hypoxia (HCC)   Dyslipidemia   Type 2 diabetes mellitus with peripheral neuropathy (HCC)   Essential hypertension   Chronic obstructive pulmonary disease (HCC)   Depression   BPH (benign prostatic hyperplasia)    Body mass index is 27.41 kg/m.   Sepsis secondary to pneumonia: Continue empiric IV antibiotics.  Incentive spirometry as needed.  No growth on blood cultures. Leukocytosis is improving.   Acute hypoxemic respiratory failure: He is on 4 L/min oxygen via [place].  Wean off oxygen as able.   CKD stage 3b: Creatinine is stable Hyperkalemia: Potassium is trending up.  Continue Lokelma.   COPD: Continue bronchodilators.   General weakness: PT recommend discharge to SNF.  Consult TOC to assist with placement.  OT evaluation.   Acute on chronic anemia: Hemoglobin up from 7.6-7.9.  No indication for blood transfusion at this time..    Comorbidities include hypertension, hyperlipidemia, type 2 diabetes mellitus, BPH, mild to moderate aortic stenosis, depression   Plan of care was discussed with Ms. Elton Sin, niece, over the  phone.   Diet Order             Diet Heart Room service appropriate? Yes; Fluid consistency: Thin  Diet effective now                            Consultants: None  Procedures: None    Medications:    aspirin  81 mg Oral Daily   atorvastatin  80 mg Oral QPM   enoxaparin (LOVENOX) injection  30 mg Subcutaneous Q24H   ferrous sulfate  325 mg Oral BID WC   fluticasone  2 spray Each Nare QPM   fluticasone furoate-vilanterol  1 puff Inhalation Daily   And   umeclidinium bromide  1 puff Inhalation Daily   gabapentin  300 mg Oral TID   guaiFENesin  600 mg Oral BID   loratadine  10 mg Oral Daily   magnesium oxide  400 mg Oral Daily   multivitamin with minerals  1 tablet Oral Daily   pantoprazole  40 mg Oral Daily   sertraline  50 mg Oral BID   tamsulosin  0.4 mg Oral QPC supper   Continuous Infusions:  azithromycin 500 mg (11/05/23 7846)   cefTRIAXone (ROCEPHIN)  IV 2 g (11/05/23 0952)     Anti-infectives (From admission, onward)    Start     Dose/Rate Route Frequency Ordered Stop   11/04/23 0600  azithromycin (ZITHROMAX) 500 mg in dextrose 5 %  250 mL IVPB        500 mg 255 mL/hr over 60 Minutes Intravenous Every 24 hours 11/03/23 2321     11/03/23 0800  cefTRIAXone (ROCEPHIN) 2 g in sodium chloride 0.9 % 100 mL IVPB        2 g 200 mL/hr over 30 Minutes Intravenous Every 24 hours 11/03/23 0222 11/08/23 0759   11/03/23 0800  azithromycin (ZITHROMAX) 500 mg in sodium chloride 0.9 % 250 mL IVPB  Status:  Discontinued        500 mg 250 mL/hr over 60 Minutes Intravenous Every 24 hours 11/03/23 0222 11/03/23 2321   11/02/23 2145  vancomycin (VANCOREADY) IVPB 1750 mg/350 mL        1,750 mg 175 mL/hr over 120 Minutes Intravenous Every 24 hours 11/02/23 2130 11/03/23 0244   11/02/23 2130  ceFEPIme (MAXIPIME) 2 g in sodium chloride 0.9 % 100 mL IVPB        2 g 200 mL/hr over 30 Minutes Intravenous  Once 11/02/23 2128 11/02/23 2234   11/02/23 2130  metroNIDAZOLE  (FLAGYL) IVPB 500 mg        500 mg 100 mL/hr over 60 Minutes Intravenous  Once 11/02/23 2128 11/03/23 0025   11/02/23 2130  vancomycin (VANCOCIN) IVPB 1000 mg/200 mL premix  Status:  Discontinued        1,000 mg 200 mL/hr over 60 Minutes Intravenous  Once 11/02/23 2128 11/02/23 2130              Family Communication/Anticipated D/C date and plan/Code Status   DVT prophylaxis: enoxaparin (LOVENOX) injection 30 mg Start: 11/04/23 0800     Code Status: Full Code  Family Communication: Francisco Gibbs, niece, over the phone Disposition Plan: Plan to discharge home   Status is: Inpatient Remains inpatient appropriate because: Sepsis from pneumonia       Subjective:   Interval events noted.  Breathing is okay.  He requested that I call Steward Drone, sister and ask her to visit.  He also wanted his nephew and niece to visit.  Objective:    Vitals:   11/04/23 2329 11/05/23 0432 11/05/23 0759 11/05/23 1138  BP: (!) 109/52 113/61 (!) 159/68 (!) 148/62  Pulse: 80 75 81 91  Resp: 19 19 18 18   Temp: 98.5 F (36.9 C) 98.3 F (36.8 C) 98.7 F (37.1 C) 98.3 F (36.8 C)  TempSrc: Oral Oral    SpO2: 93% 94% 92% 99%  Weight:      Height:       No data found.   Intake/Output Summary (Last 24 hours) at 11/05/2023 1437 Last data filed at 11/05/2023 0433 Gross per 24 hour  Intake 100 ml  Output 1450 ml  Net -1350 ml   Filed Weights   11/02/23 2126  Weight: 79.4 kg    Exam:  GEN: NAD SKIN: Warm and dry EYES: Anicteric ENT: MMM, hearing impairment CV: RRR PULM: Bilateral wheezing ABD: soft, ND, NT, +BS CNS: AAO x 3, non focal EXT: No edema or tenderness       Data Reviewed:   I have personally reviewed following labs and imaging studies:  Labs: Labs show the following:   Basic Metabolic Panel: Recent Labs  Lab 11/02/23 2136 11/03/23 0514 11/04/23 0525 11/05/23 0443  NA 137 135 135 135  K 4.8 4.9 5.3* 5.4*  CL 105 106 108 105  CO2 23 21* 23 24  GLUCOSE 91  72 130* 157*  BUN 39* 42* 48* 42*  CREATININE 1.62*  1.90* 1.93* 1.55*  CALCIUM 9.2 8.6* 8.4* 8.8*   GFR Estimated Creatinine Clearance: 33.3 mL/min (A) (by C-G formula based on SCr of 1.55 mg/dL (H)). Liver Function Tests: Recent Labs  Lab 11/02/23 2136  AST 23  ALT 16  ALKPHOS 38  BILITOT 0.6  PROT 6.2*  ALBUMIN 3.9   No results for input(s): "LIPASE", "AMYLASE" in the last 168 hours. No results for input(s): "AMMONIA" in the last 168 hours. Coagulation profile Recent Labs  Lab 11/02/23 2136 11/03/23 0514  INR 1.2 1.4*    CBC: Recent Labs  Lab 11/02/23 2136 11/03/23 0514 11/04/23 0525 11/05/23 0443  WBC 25.5* 21.3* 20.1* 16.8*  NEUTROABS 22.4*  --   --  14.5*  HGB 9.4* 8.5* 7.6* 7.9*  HCT 29.1* 27.0* 23.3* 24.9*  MCV 95.1 96.8 94.3 95.4  PLT 195 150 141* 141*   Cardiac Enzymes: Recent Labs  Lab 11/04/23 1742  CKTOTAL 208   BNP (last 3 results) No results for input(s): "PROBNP" in the last 8760 hours. CBG: No results for input(s): "GLUCAP" in the last 168 hours. D-Dimer: No results for input(s): "DDIMER" in the last 72 hours. Hgb A1c: No results for input(s): "HGBA1C" in the last 72 hours. Lipid Profile: No results for input(s): "CHOL", "HDL", "LDLCALC", "TRIG", "CHOLHDL", "LDLDIRECT" in the last 72 hours. Thyroid function studies: No results for input(s): "TSH", "T4TOTAL", "T3FREE", "THYROIDAB" in the last 72 hours.  Invalid input(s): "FREET3" Anemia work up: No results for input(s): "VITAMINB12", "FOLATE", "FERRITIN", "TIBC", "IRON", "RETICCTPCT" in the last 72 hours. Sepsis Labs: Recent Labs  Lab 11/02/23 2136 11/02/23 2334 11/03/23 0514 11/04/23 0525 11/05/23 0443  PROCALCITON  --   --  4.72  --   --   WBC 25.5*  --  21.3* 20.1* 16.8*  LATICACIDVEN 1.4 1.4  --   --   --     Microbiology Recent Results (from the past 240 hour(s))  Resp panel by RT-PCR (RSV, Flu A&B, Covid) Anterior Nasal Swab     Status: None   Collection Time:  11/02/23  9:37 PM   Specimen: Anterior Nasal Swab  Result Value Ref Range Status   SARS Coronavirus 2 by RT PCR NEGATIVE NEGATIVE Final    Comment: (NOTE) SARS-CoV-2 target nucleic acids are NOT DETECTED.  The SARS-CoV-2 RNA is generally detectable in upper respiratory specimens during the acute phase of infection. The lowest concentration of SARS-CoV-2 viral copies this assay can detect is 138 copies/mL. A negative result does not preclude SARS-Cov-2 infection and should not be used as the sole basis for treatment or other patient management decisions. A negative result may occur with  improper specimen collection/handling, submission of specimen other than nasopharyngeal swab, presence of viral mutation(s) within the areas targeted by this assay, and inadequate number of viral copies(<138 copies/mL). A negative result must be combined with clinical observations, patient history, and epidemiological information. The expected result is Negative.  Fact Sheet for Patients:  BloggerCourse.com  Fact Sheet for Healthcare Providers:  SeriousBroker.it  This test is no t yet approved or cleared by the [place] FDA and  has been authorized for detection and/or diagnosis of SARS-CoV-2 by FDA under an Emergency Use Authorization (EUA). This EUA will remain  in effect (meaning this test can be used) for the duration of the COVID-19 declaration under Section 564(b)(1) of the Act, 21 U.S.C.section 360bbb-3(b)(1), unless the authorization is terminated  or revoked sooner.       Influenza A by PCR NEGATIVE NEGATIVE  Final   Influenza B by PCR NEGATIVE NEGATIVE Final    Comment: (NOTE) The Xpert Xpress SARS-CoV-2/FLU/RSV plus assay is intended as an aid in the diagnosis of influenza from Nasopharyngeal swab specimens and should not be used as a sole basis for treatment. Nasal washings and aspirates are unacceptable for Xpert Xpress  SARS-CoV-2/FLU/RSV testing.  Fact Sheet for Patients: BloggerCourse.com  Fact Sheet for Healthcare Providers: SeriousBroker.it  This test is not yet approved or cleared by the [place] FDA and has been authorized for detection and/or diagnosis of SARS-CoV-2 by FDA under an Emergency Use Authorization (EUA). This EUA will remain in effect (meaning this test can be used) for the duration of the COVID-19 declaration under Section 564(b)(1) of the Act, 21 U.S.C. section 360bbb-3(b)(1), unless the authorization is terminated or revoked.     Resp Syncytial Virus by PCR NEGATIVE NEGATIVE Final    Comment: (NOTE) Fact Sheet for Patients: BloggerCourse.com  Fact Sheet for Healthcare Providers: SeriousBroker.it  This test is not yet approved or cleared by the [place] FDA and has been authorized for detection and/or diagnosis of SARS-CoV-2 by FDA under an Emergency Use Authorization (EUA). This EUA will remain in effect (meaning this test can be used) for the duration of the COVID-19 declaration under Section 564(b)(1) of the Act, 21 U.S.C. section 360bbb-3(b)(1), unless the authorization is terminated or revoked.  Performed at [facility], [address] [place], [place] [address]   Blood Culture (routine x 2)     Status: None (Preliminary result)   Collection Time: 11/02/23  9:37 PM   Specimen: BLOOD  Result Value Ref Range Status   Specimen Description BLOOD BLOOD LEFT ARM  Final   Special Requests   Final    BOTTLES DRAWN AEROBIC AND ANAEROBIC Blood Culture adequate volume   Culture   Final    NO GROWTH 2 DAYS Performed at [facility], [address] [place], [place] [address]    Report Status PENDING  Incomplete  Blood Culture (routine x 2)     Status: None (Preliminary result)   Collection Time: 11/02/23  9:37 PM   Specimen: BLOOD  Result  Value Ref Range Status   Specimen Description BLOOD BLOOD RIGHT ARM  Final   Special Requests   Final    BOTTLES DRAWN AEROBIC AND ANAEROBIC Blood Culture adequate volume   Culture   Final    NO GROWTH 2 DAYS Performed at [facility], [address] [place], [place] [address]    Report Status PENDING  Incomplete    Procedures and diagnostic studies:  No results found.             LOS: 2 days   Maylani Embree  [facility]   Pager on [email or web address]. If 7PM-7AM, please contact night-coverage at [email or web address]     11/05/2023, 2:37 PM

## 2023-11-05 NOTE — TOC Progression Note (Signed)
Transition of Care [facility]) - Progression Note    Patient Details  Name: Francisco Gibbs MRN: [id number] Date of Birth: 07-01-1933  Transition of Care [facility]) CM/SW Contact  Truddie Hidden, RN Phone Number: 11/05/2023, 3:34 PM  Clinical Narrative:    Spoke with patient at bedside regarding therapy's recommendation. He is agreeable to [facility] and does not have a preference of an agency. Patient advised the accepting agency will contact him directly to scheduled SOC within 48 post discharge.  Referral sent and accepted by Elnita Maxwell from [facility].        Expected Discharge Plan and Services                                               Social Determinants of Health (SDOH) Interventions SDOH Screenings   Food Insecurity: No Food Insecurity (11/05/2023)  Housing: Patient Unable To Answer (11/05/2023)  Transportation Needs: No Transportation Needs (11/05/2023)  Utilities: Patient Declined (11/05/2023)  Financial Resource Strain: Low Risk  (06/15/2023)   Received from [facility]  Tobacco Use: Medium Risk (09/28/2023)   Received from [facility]    Readmission Risk Interventions    11/03/2023    9:46 AM  Readmission Risk Prevention Plan  Transportation Screening Complete  PCP or Specialist Appt within 3-5 Days Complete  HRI or Home Care Consult Complete  Social Work Consult for Recovery Care Planning/Counseling Complete  Palliative Care Screening Not Applicable  Medication Review Oceanographer) Not Complete  Med Review Comments Patient will review medication with MD upon discharge

## 2023-11-05 NOTE — Plan of Care (Signed)

## 2023-11-05 NOTE — Evaluation (Signed)
Physical Therapy Evaluation Patient Details Name: Francisco Gibbs MRN: [id number] DOB: 12-28-1932 Today's Date: 11/05/2023  History of Present Illness  Pt is an 89 y.o. male  with medical history significant for COPD, GERD, hypertension, dyslipidemia, CKD, type 2 diabetes mellitus, aortic stenosis, who presented to the hospital because of generalized weakness and fall. Workup for sepsis due to PNA.   Clinical Impression  Patient alert, agreeable to Pt with some motivation. Pt HOH, hard to truly assess cognition but oriented to his name, place, able to describe PLOF. Pt did quickly get frustrated but able to redirect, and intermittently restless. Per pt at baseline he is modI with his [facility], lives alone with family checking 2-3 times a week and helps with driving/errands/appts.   Pt ranging from 89-91% on 3L at rest and during sitting. He was able to perform bed mobility modI, good sitting balance noted. Sit <> stand from EOB and from recliner with CGA-supervision and RW. He was able to ambulate ~58ft with RW and CGA but noted for impulsivity, increased WOB and cues for safety needed. Returned to sitting in room and spO2 reading 77%, placed on 4L to recover ~3 minutes and then returned to 3L.  Overall the patient demonstrated deficits (see "PT Problem List") that impede the patient's functional abilities, safety, and mobility and would benefit from skilled PT intervention.        If plan is discharge home, recommend the following: A little help with bathing/dressing/bathroom;Assistance with cooking/housework;Assist for transportation;Help with stairs or ramp for entrance;A little help with walking and/or transfers   Can travel by private vehicle   No    Equipment Recommendations None recommended by PT (pt has RW and SPC at home)  Recommendations for Other Services       Functional Status Assessment Patient has had a recent decline in their functional status and demonstrates the ability to make  significant improvements in function in a reasonable and predictable amount of time.     Precautions / Restrictions Precautions Precautions: Fall Restrictions Weight Bearing Restrictions: No      Mobility  Bed Mobility Overal bed mobility: Modified Independent                  Transfers Overall transfer level: Needs assistance Equipment used: Rolling walker (2 wheels) Transfers: Sit to/from Stand Sit to Stand: Supervision           General transfer comment: quick but able to perform without assistance    Ambulation/Gait Ambulation/Gait assistance: Contact guard assist Gait Distance (Feet): 55 Feet Assistive device: Rolling walker (2 wheels)         General Gait Details: pt ambulates quickly, some redirection needed to improve safety  Stairs            Wheelchair Mobility     Tilt Bed    Modified Rankin (Stroke Patients Only)       Balance Overall balance assessment: Needs assistance Sitting-balance support: Feet supported Sitting balance-Leahy Scale: Good     Standing balance support: Bilateral upper extremity supported Standing balance-Leahy Scale: Fair                               Pertinent Vitals/Pain Pain Assessment Pain Assessment: No/denies pain    Home Living Family/patient expects to be discharged to:: Private residence Living Arrangements: Alone Available Help at Discharge: Family Type of Home: Mobile home Home Access: Stairs to enter Entrance Stairs-Rails: Right;Left Entrance  Stairs-Number of Steps: 2   Home Layout: One level Home Equipment: Agricultural consultant (2 wheels);Cane - single point;Grab bars - tub/shower;Shower seat      Prior Function Prior Level of Function : Independent/Modified Independent             Mobility Comments: modI with cane, family assists with driving like appts, errands, etc. pt states he does his own cooking/cleaning       Extremity/Trunk Assessment   Upper Extremity  Assessment Upper Extremity Assessment: Generalized weakness    Lower Extremity Assessment Lower Extremity Assessment: Generalized weakness       Communication      Cognition Arousal: Alert Behavior During Therapy: WFL for tasks assessed/performed, Impulsive Overall Cognitive Status: Within Functional Limits for tasks assessed                                 General Comments: pt HOH. oriented to self, oriented to location but some impulsivity noted and pt quick to become frustrated        General Comments      Exercises     Assessment/Plan    PT Assessment Patient needs continued PT services  PT Problem List Decreased strength;Decreased activity tolerance;Decreased balance;Decreased mobility;Decreased safety awareness       PT Treatment Interventions DME instruction;Neuromuscular re-education;Gait training;Stair training;Patient/family education;Functional mobility training;Therapeutic activities;Therapeutic exercise;Balance training    PT Goals (Current goals can be found in the Care Plan section)  Acute Rehab PT Goals Patient Stated Goal: to go home PT Goal Formulation: With patient Time For Goal Achievement: 11/19/23 Potential to Achieve Goals: Good    Frequency Min 1X/week     Co-evaluation               AM-PAC PT "6 Clicks" Mobility  Outcome Measure Help needed turning from your back to your side while in a flat bed without using bedrails?: None Help needed moving from lying on your back to sitting on the side of a flat bed without using bedrails?: None Help needed moving to and from a bed to a chair (including a wheelchair)?: None Help needed standing up from a chair using your arms (e.g., wheelchair or bedside chair)?: A Little Help needed to walk in hospital room?: A Little Help needed climbing 3-5 steps with a railing? : A Little 6 Click Score: 21    End of Session Equipment Utilized During Treatment: Gait belt Activity  Tolerance: Patient limited by fatigue Patient left: in chair;with call bell/phone within reach;with chair alarm set Nurse Communication: Mobility status PT Visit Diagnosis: Other abnormalities of gait and mobility (R26.89);Difficulty in walking, not elsewhere classified (R26.2);Muscle weakness (generalized) (M62.81)    Time: 1057-1130 PT Time Calculation (min) (ACUTE ONLY): 33 min   Charges:   PT Evaluation $PT Eval Low Complexity: 1 Low PT Treatments $Therapeutic Activity: 23-37 mins PT General Charges $$ ACUTE PT VISIT: 1 Visit         Olga Coaster PT, DPT 12:02 PM,11/05/23

## 2023-11-05 NOTE — Evaluation (Signed)
Occupational Therapy Evaluation Patient Details Name: Francisco Gibbs MRN: [id number] DOB: Jul 19, 1933 Today's Date: 11/05/2023   History of Present Illness Pt is an 89 y.o. male  with medical history significant for COPD, GERD, hypertension, dyslipidemia, CKD, type 2 diabetes mellitus, aortic stenosis, who presented to the hospital because of generalized weakness and fall. Workup for sepsis due to PNA.   Clinical Impression   Pt was seen for OT evaluation this date. Prior to hospital admission, pt lives alone in a 1 level home with 2 STE. Reports at [place] modI with his SPC.  Family checks on him 2-3 times/wk to help with driving/errands/appts. Reports they also brings him meals at times. Ind with ADLs.  Pt presents to acute OT demonstrating impaired ADL performance and functional mobility 2/2 weakness, low activity tolerance (See OT problem list for additional functional deficits). Pt currently requires SBA for STS from recliner to RW. Cueing for pacing and safety. Donned bil slip on shoes via seated lateral leans with SUP. Pt denies pain. HOH, therefore hard to assess cognition, but appears intact. 02 on 3L 89-92 at rest. Increased to 4L for mobility to the bathroom and back with pt demo ability to stand at toilet to simulate urination using RW with CGA. Returned back to recliner and 02 noted with drop to 83%. Edu on pursed lip breathing with increase to 90% on 3L at rest. Constant CGA provided d/t impulsivity/picking up RW and walking backwards towards toilet instead of turning around to get to toilet. Safety awareness is limited. Pt would benefit from skilled OT services to address noted impairments and functional limitations (see below for any additional details) in order to maximize safety and independence while minimizing falls risk and caregiver burden. Do anticipate the need for follow up OT services upon acute hospital DC.        If plan is discharge home, recommend the following: A little  help with walking and/or transfers;A little help with bathing/dressing/bathroom;Assistance with cooking/housework;Assist for transportation;Help with stairs or ramp for entrance    Functional Status Assessment  Patient has had a recent decline in their functional status and demonstrates the ability to make significant improvements in function in a reasonable and predictable amount of time.  Equipment Recommendations  None recommended by OT    Recommendations for Other Services       Precautions / Restrictions Precautions Precautions: Fall Restrictions Weight Bearing Restrictions: No      Mobility Bed Mobility               General bed mobility comments: NT up in recliner pre/post session    Transfers Overall transfer level: Needs assistance Equipment used: Rolling walker (2 wheels) Transfers: Sit to/from Stand Sit to Stand: Supervision           General transfer comment: SUP for STS from recliner, CGA for in room mobility using RW with cueing for pace and pt walking backwards at times      Balance Overall balance assessment: Needs assistance Sitting-balance support: Feet supported Sitting balance-Leahy Scale: Good     Standing balance support: Bilateral upper extremity supported Standing balance-Leahy Scale: Fair Standing balance comment: noted to pick up walker off floor once with CGA, no LOB during session                           ADL either performed or assessed with clinical judgement   ADL Overall ADL's : Needs assistance/impaired Eating/Feeding: Set up;Sitting  Lower Body Dressing: Supervision/safety;Sitting/lateral leans Lower Body Dressing Details (indicate cue type and reason): to don shoes seated in recliner Toilet Transfer: Contact guard assist;Rolling walker (2 wheels) Toilet Transfer Details (indicate cue type and reason): simulated to recliner/pt able to stand in front of toilet as if urinating with RW  use and CGA from therapist         Functional mobility during ADLs: Contact guard assist;Rolling walker (2 wheels)       Vision         Perception         Praxis         Pertinent Vitals/Pain Pain Assessment Pain Assessment: No/denies pain     Extremity/Trunk Assessment Upper Extremity Assessment Upper Extremity Assessment: Generalized weakness (4-/5 bilaterally)   Lower Extremity Assessment Lower Extremity Assessment: Generalized weakness       Communication Communication Communication: Hearing impairment   Cognition Arousal: Alert Behavior During Therapy: WFL for tasks assessed/performed, Impulsive Overall Cognitive Status: Within Functional Limits for tasks assessed                                 General Comments: pt HOH. oriented to self, oriented to location but some impulsivity noted and pt quick to become frustrated     General Comments  02 drop to 83% on 4L following mobility back up to 90 on 3L at rest upon exit    Exercises Other Exercises Other Exercises: Edu on role of OT in acute setting and importanct of therapy to maximize strength/IND to return to PLOF.   Shoulder Instructions      Home Living Family/patient expects to be discharged to:: Private residence Living Arrangements: Alone Available Help at Discharge: Family Type of Home: Mobile home Home Access: Stairs to enter Entrance Stairs-Number of Steps: 2 Entrance Stairs-Rails: Right;Left Home Layout: One level     Bathroom Shower/Tub: Chief Strategy Officer: Standard     Home Equipment: Agricultural consultant (2 wheels);Cane - single point;Grab bars - tub/shower;Shower seat          Prior Functioning/Environment Prior Level of Function : Independent/Modified Independent             Mobility Comments: modI with cane, family assists with driving like appts, errands, etc. pt states he does his own cooking/cleaning ADLs Comments: IND with ADLs, family  brings meals in at times        OT Problem List: Decreased strength;Decreased activity tolerance;Decreased safety awareness      OT Treatment/Interventions: Self-care/ADL training;Therapeutic exercise;Patient/family education;Balance training;Energy conservation;Therapeutic activities    OT Goals(Current goals can be found in the care plan section) Acute Rehab OT Goals Patient Stated Goal: improve breathing and strength OT Goal Formulation: With patient Time For Goal Achievement: 11/19/23 Potential to Achieve Goals: Good ADL Goals Pt Will Perform Lower Body Bathing: with supervision;sitting/lateral leans;sit to/from stand Pt Will Perform Lower Body Dressing: with supervision;sit to/from stand;sitting/lateral leans Pt Will Transfer to Toilet: with supervision;ambulating;regular height toilet Pt Will Perform Toileting - Clothing Manipulation and hygiene: with supervision;sitting/lateral leans;sit to/from stand Additional ADL Goal #1: Pt will verbalize/demo 1 learned ECS strategy, e.g. PLB, pacing, taking rest breaks or use of AE/AD to prevent overexertion.  OT Frequency: Min 1X/week    Co-evaluation              AM-PAC OT "6 Clicks" Daily Activity     Outcome Measure Help from another person eating  meals?: None Help from another person taking care of personal grooming?: None Help from another person toileting, which includes using toliet, bedpan, or urinal?: A Little Help from another person bathing (including washing, rinsing, drying)?: A Little Help from another person to put on and taking off regular upper body clothing?: None Help from another person to put on and taking off regular lower body clothing?: A Little 6 Click Score: 21   End of Session Equipment Utilized During Treatment: Rolling walker (2 wheels);Oxygen  Activity Tolerance: Patient tolerated treatment well Patient left: in chair;with call bell/phone within reach;with chair alarm set  OT Visit Diagnosis:  Other abnormalities of gait and mobility (R26.89)                Time: 2585-2778 OT Time Calculation (min): 28 min Charges:  OT General Charges $OT Visit: 1 Visit OT Evaluation $OT Eval Low Complexity: 1 Low OT Treatments $Self Care/Home Management : 8-22 mins Pablo Stauffer, OTR/L 11/05/23, 2:53 PM  Saria Haran E Jayshun Galentine 11/05/2023, 2:48 PM

## 2023-11-05 NOTE — Plan of Care (Signed)
  Problem: Clinical Measurements: Goal: Signs and symptoms of infection will decrease Outcome: Progressing   Problem: Clinical Measurements: Goal: Cardiovascular complication will be avoided Outcome: Progressing   Problem: Coping: Goal: Level of anxiety will decrease Outcome: Progressing   Problem: Elimination: Goal: Will not experience complications related to urinary retention Outcome: Progressing   Problem: Pain Management: Goal: General experience of comfort will improve Outcome: Progressing   Problem: Nutrition: Goal: Adequate nutrition will be maintained Outcome: Not Progressing

## 2023-11-06 ENCOUNTER — Encounter: Payer: Self-pay | Admitting: Family Medicine

## 2023-11-06 DIAGNOSIS — J189 Pneumonia, unspecified organism: Secondary | ICD-10-CM | POA: Diagnosis not present

## 2023-11-06 DIAGNOSIS — A419 Sepsis, unspecified organism: Secondary | ICD-10-CM | POA: Diagnosis not present

## 2023-11-06 LAB — CBC WITH DIFFERENTIAL/PLATELET
Abs Immature Granulocytes: 0.19 10*3/uL — ABNORMAL HIGH (ref 0.00–0.07)
Basophils Absolute: 0 10*3/uL (ref 0.0–0.1)
Basophils Relative: 0 %
Eosinophils Absolute: 0.1 10*3/uL (ref 0.0–0.5)
Eosinophils Relative: 1 %
HCT: 27.2 % — ABNORMAL LOW (ref 39.0–52.0)
Hemoglobin: 8.7 g/dL — ABNORMAL LOW (ref 13.0–17.0)
Immature Granulocytes: 2 %
Lymphocytes Relative: 7 %
Lymphs Abs: 0.8 10*3/uL (ref 0.7–4.0)
MCH: 30 pg (ref 26.0–34.0)
MCHC: 32 g/dL (ref 30.0–36.0)
MCV: 93.8 fL (ref 80.0–100.0)
Monocytes Absolute: 0.9 10*3/uL (ref 0.1–1.0)
Monocytes Relative: 8 %
Neutro Abs: 9.9 10*3/uL — ABNORMAL HIGH (ref 1.7–7.7)
Neutrophils Relative %: 82 %
Platelets: 158 10*3/uL (ref 150–400)
RBC: 2.9 MIL/uL — ABNORMAL LOW (ref 4.22–5.81)
RDW: 13.4 % (ref 11.5–15.5)
WBC: 11.9 10*3/uL — ABNORMAL HIGH (ref 4.0–10.5)
nRBC: 0 % (ref 0.0–0.2)

## 2023-11-06 LAB — BASIC METABOLIC PANEL
Anion gap: 7 (ref 5–15)
BUN: 30 mg/dL — ABNORMAL HIGH (ref 8–23)
CO2: 25 mmol/L (ref 22–32)
Calcium: 9.1 mg/dL (ref 8.9–10.3)
Chloride: 103 mmol/L (ref 98–111)
Creatinine, Ser: 1.23 mg/dL (ref 0.61–1.24)
GFR, Estimated: 56 mL/min — ABNORMAL LOW (ref >60)
Glucose, Bld: 118 mg/dL — ABNORMAL HIGH (ref 70–99)
Potassium: 4.9 mmol/L (ref 3.5–5.1)
Sodium: 135 mmol/L (ref 135–145)

## 2023-11-06 LAB — GLUCOSE, CAPILLARY
Glucose-Capillary: 138 mg/dL — ABNORMAL HIGH (ref 70–99)
Glucose-Capillary: 102 mg/dL — ABNORMAL HIGH (ref 70–99)
Glucose-Capillary: 149 mg/dL — ABNORMAL HIGH (ref 70–99)

## 2023-11-06 MED ORDER — INFLUENZA VAC A&B SURF ANT ADJ 0.5 ML IM SUSY
0.5000 mL | PREFILLED_SYRINGE | INTRAMUSCULAR | Status: AC
  Administered 2023-11-07: 0.5 mL via INTRAMUSCULAR
  Filled 2023-11-06: qty 0.5

## 2023-11-06 MED ORDER — PNEUMOCOCCAL 20-VAL CONJ VACC 0.5 ML IM SUSY
0.5000 mL | PREFILLED_SYRINGE | INTRAMUSCULAR | Status: AC
  Administered 2023-11-07: 0.5 mL via INTRAMUSCULAR
  Filled 2023-11-06: qty 0.5

## 2023-11-06 MED ORDER — SODIUM CHLORIDE 0.9 % IV SOLN
1.0000 g | Freq: Once | INTRAVENOUS | Status: AC
  Administered 2023-11-07: 1 g via INTRAVENOUS
  Filled 2023-11-06: qty 10

## 2023-11-06 MED ORDER — PREDNISONE 20 MG PO TABS
40.0000 mg | ORAL_TABLET | Freq: Every day | ORAL | Status: AC
  Administered 2023-11-06 – 2023-11-10 (×5): 40 mg via ORAL
  Filled 2023-11-06 (×5): qty 2

## 2023-11-06 MED ORDER — HYDRALAZINE HCL 50 MG PO TABS
50.0000 mg | ORAL_TABLET | Freq: Four times a day (QID) | ORAL | Status: DC
  Administered 2023-11-06 – 2023-11-10 (×17): 50 mg via ORAL
  Filled 2023-11-06 (×17): qty 1

## 2023-11-06 MED ORDER — ENOXAPARIN SODIUM 40 MG/0.4ML IJ SOSY
40.0000 mg | PREFILLED_SYRINGE | INTRAMUSCULAR | Status: DC
  Administered 2023-11-06 – 2023-11-09 (×2): 40 mg via SUBCUTANEOUS
  Filled 2023-11-06 (×4): qty 0.4

## 2023-11-06 MED ORDER — BENZONATATE 100 MG PO CAPS
100.0000 mg | ORAL_CAPSULE | Freq: Two times a day (BID) | ORAL | Status: AC
  Administered 2023-11-06 (×2): 100 mg via ORAL
  Filled 2023-11-06 (×2): qty 1

## 2023-11-06 NOTE — TOC Progression Note (Addendum)
Transition of Care [facility]) - Progression Note    Patient Details  Name: Francisco Gibbs MRN: [id number] Date of Birth: 10/26/1933  [facility] CM/SW Contact  Liliana Cline, LCSW Phone Number: 11/06/2023, 3:35 PM  Clinical Narrative:    Notified by MD that patient's family wants him to go to SNF. CSW met with patient at bedside. Patient states he does not want to go to short term rehab. Patient is agreeable to [facility]. Patient states he feels comfortable returning home, he states he has several neighbors that can help him as well as family.  Checked with MD who states patient does have capacity to make his own decisions.        Expected Discharge Plan and Services                                               Social Determinants of Health (SDOH) Interventions SDOH Screenings   Food Insecurity: No Food Insecurity (11/05/2023)  Housing: Patient Unable To Answer (11/05/2023)  Transportation Needs: No Transportation Needs (11/05/2023)  Utilities: Patient Declined (11/05/2023)  Financial Resource Strain: Low Risk  (06/15/2023)   Received from [facility]  Tobacco Use: Medium Risk (09/28/2023)   Received from [facility]    Readmission Risk Interventions    11/03/2023    9:46 AM  Readmission Risk Prevention Plan  Transportation Screening Complete  PCP or Specialist Appt within 3-5 Days Complete  HRI or Home Care Consult Complete  Social Work Consult for Recovery Care Planning/Counseling Complete  Palliative Care Screening Not Applicable  Medication Review Oceanographer) Not Complete  Med Review Comments Patient will review medication with MD upon discharge

## 2023-11-06 NOTE — Plan of Care (Addendum)
Patient remains on [facility] at time of writing. Patient is disoriented to time and situation, but pleasant and easily redirectable. Patient's admission profile completed overnight by this RN with immunizations ordered per protocol. The patient is noted to have an order: "Vital signs, routine, every 2 hours". 2213 hours: Secure Chat sent to Manuela Schwartz, NP: "S: Patient has an order for Q 2 hours VS, probably no longer needed. B: Patient has been here since 12/2 with sepsis, CAP. A: Likely close to discharge. Med-Surg level of care. Possible DC tomorrow. R: Could we deescalate VS from q2h to routine (they have not actually been checked q 2 hours for a awhile)? Thanks." 2218 hours: secure chat reply received from Manuela Schwartz, NP: "are they even doing it every 2h. likely not. pretty sure this is a housekeeping, oops thing." Secure chat reply sent by this RN at 2219 hours: "Correct, it has not been being checked that often. I just did not want it to look like we are ignoring the order". 2220 hours: secure chat reply received from Manuela Schwartz, NP: "ok so. continue doing as it has been done and defer all further housekeeping needs to day team. i am here to meet all acute and emergent needs of your patients". No updated orders received from the provider responsible for this patient's care overnight. Will plan to continue routine VS. Also, patient has been appropriately scored as a "high risk for falls; however, inadequate fall prevention measures were in place upon change of shift. Fall prevention bundle implemented by this RN.    Problem: Fluid Volume: Goal: Hemodynamic stability will improve Outcome: Progressing   Problem: Clinical Measurements: Goal: Diagnostic test results will improve Outcome: Progressing Goal: Signs and symptoms of infection will decrease Outcome: Progressing   Problem: Respiratory: Goal: Ability to maintain adequate ventilation will improve Outcome: Progressing   Problem:  Education: Goal: Knowledge of General Education information will improve Description: Including pain rating scale, medication(s)/side effects and non-pharmacologic comfort measures Outcome: Progressing   Problem: Health Behavior/Discharge Planning: Goal: Ability to manage health-related needs will improve Outcome: Progressing   Problem: Clinical Measurements: Goal: Ability to maintain clinical measurements within normal limits will improve Outcome: Progressing Goal: Will remain free from infection Outcome: Progressing Goal: Diagnostic test results will improve Outcome: Progressing Goal: Respiratory complications will improve Outcome: Progressing Goal: Cardiovascular complication will be avoided Outcome: Progressing   Problem: Activity: Goal: Risk for activity intolerance will decrease Outcome: Progressing   Problem: Nutrition: Goal: Adequate nutrition will be maintained Outcome: Progressing   Problem: Coping: Goal: Level of anxiety will decrease Outcome: Progressing   Problem: Elimination: Goal: Will not experience complications related to bowel motility Outcome: Progressing Goal: Will not experience complications related to urinary retention Outcome: Progressing   Problem: Pain Management: Goal: General experience of comfort will improve Outcome: Progressing   Problem: Safety: Goal: Ability to remain free from injury will improve Outcome: Progressing   Problem: Skin Integrity: Goal: Risk for impaired skin integrity will decrease Outcome: Progressing

## 2023-11-06 NOTE — Progress Notes (Addendum)
Progress Note    Francisco DELANUEZ  Gibbs:[id number] DOB: July 05, 1933  DOA: 11/02/2023 PCP: Abram Sander, MD      Brief Narrative:    Medical records reviewed and are as summarized below:  Francisco Gibbs is a 89 y.o. male  with medical history significant for COPD, GERD, hypertension, dyslipidemia, type 2 diabetes mellitus, aortic stenosis, who presented to the hospital because of generalized weakness and fall.  He lost his balance and fell backward striking his head on the wall.  He was unable to stand up after the fall.  He laid on the ground for about 2 hours until his daughter came home and found him on the floor.  He also complained of cough productive of whitish sputum, shortness of breath and wheezing.         Assessment/Plan:   Principal Problem:   Sepsis due to pneumonia [facility]) Active Problems:   Acute respiratory failure with hypoxia (HCC)   Dyslipidemia   Type 2 diabetes mellitus with peripheral neuropathy (HCC)   Essential hypertension   Chronic obstructive pulmonary disease (HCC)   Depression   BPH (benign prostatic hyperplasia)    Body mass index is 27.41 kg/m.   Sepsis secondary to pneumonia: Discontinue IV azithromycin.  Continue IV ceftriaxone.  Incentive spirometry as needed.  No growth on blood cultures. Leukocytosis is improving.   Acute hypoxemic respiratory failure: He remains on 4 L/min oxygen.  Taper off oxygen as able..    CKD stage 3b: Creatinine is stable Hyperkalemia: Improved   COPD with acute exacerbation: Start prednisone.  Continue bronchodilators.  Add Tessalon pearls for cough.  Continue guaifenesin (has tolerated this thus far).   General weakness: PT recommend discharge to SNF.  Follow-up with transition of care team to assist with disposition.   Acute on chronic anemia: Hemoglobin up from 7.6-7.9-8.7.  No indication for blood transfusion at this time..    Comorbidities include hypertension, hyperlipidemia, type 2 diabetes  mellitus, BPH, mild to moderate aortic stenosis, depression     Diet Order             Diet Heart Room service appropriate? Yes; Fluid consistency: Thin  Diet effective now                            Consultants: None  Procedures: None    Medications:    aspirin  81 mg Oral Daily   atorvastatin  80 mg Oral QPM   enoxaparin (LOVENOX) injection  40 mg Subcutaneous Q24H   ferrous sulfate  325 mg Oral BID WC   fluticasone  2 spray Each Nare QPM   fluticasone furoate-vilanterol  1 puff Inhalation Daily   And   umeclidinium bromide  1 puff Inhalation Daily   gabapentin  300 mg Oral TID   guaiFENesin  600 mg Oral BID   hydrALAZINE  50 mg Oral QID   loratadine  10 mg Oral Daily   magnesium oxide  400 mg Oral Daily   multivitamin with minerals  1 tablet Oral Daily   pantoprazole  40 mg Oral Daily   predniSONE  40 mg Oral Q breakfast   sertraline  50 mg Oral BID   tamsulosin  0.4 mg Oral QPC supper   Continuous Infusions:  [START ON 11/07/2023] cefTRIAXone (ROCEPHIN)  IV       Anti-infectives (From admission, onward)    Start     Dose/Rate Route Frequency  Ordered Stop   11/07/23 0600  cefTRIAXone (ROCEPHIN) 1 g in sodium chloride 0.9 % 100 mL IVPB        1 g 200 mL/hr over 30 Minutes Intravenous  Once 11/06/23 0746     11/04/23 0600  azithromycin (ZITHROMAX) 500 mg in dextrose 5 % 250 mL IVPB  Status:  Discontinued        500 mg 255 mL/hr over 60 Minutes Intravenous Every 24 hours 11/03/23 2321 11/06/23 0745   11/03/23 0800  cefTRIAXone (ROCEPHIN) 2 g in sodium chloride 0.9 % 100 mL IVPB  Status:  Discontinued        2 g 200 mL/hr over 30 Minutes Intravenous Every 24 hours 11/03/23 0222 11/06/23 0745   11/03/23 0800  azithromycin (ZITHROMAX) 500 mg in sodium chloride 0.9 % 250 mL IVPB  Status:  Discontinued        500 mg 250 mL/hr over 60 Minutes Intravenous Every 24 hours 11/03/23 0222 11/03/23 2321   11/02/23 2145  vancomycin (VANCOREADY) IVPB 1750  mg/350 mL        1,750 mg 175 mL/hr over 120 Minutes Intravenous Every 24 hours 11/02/23 2130 11/03/23 0244   11/02/23 2130  ceFEPIme (MAXIPIME) 2 g in sodium chloride 0.9 % 100 mL IVPB        2 g 200 mL/hr over 30 Minutes Intravenous  Once 11/02/23 2128 11/02/23 2234   11/02/23 2130  metroNIDAZOLE (FLAGYL) IVPB 500 mg        500 mg 100 mL/hr over 60 Minutes Intravenous  Once 11/02/23 2128 11/03/23 0025   11/02/23 2130  vancomycin (VANCOCIN) IVPB 1000 mg/200 mL premix  Status:  Discontinued        1,000 mg 200 mL/hr over 60 Minutes Intravenous  Once 11/02/23 2128 11/02/23 2130              Family Communication/Anticipated D/C date and plan/Code Status   DVT prophylaxis: enoxaparin (LOVENOX) injection 40 mg Start: 11/06/23 0800     Code Status: Full Code  Family Communication: None Disposition Plan: Plan to discharge home vs SNF   Status is: Inpatient Remains inpatient appropriate because: Sepsis from pneumonia       Subjective:   Interval events noted.  He complains of cough and trouble breathing.  He said he had little sleep because of frequent cough.  Objective:    Vitals:   11/05/23 2351 11/06/23 0408 11/06/23 0746 11/06/23 1135  BP: 117/67 (!) 154/78 (!) 173/79 (!) 137/57  Pulse: 84 90 100 89  Resp:  20 20 18   Temp: 98.7 F (37.1 C) 99.4 F (37.4 C) 99.7 F (37.6 C) 98.2 F (36.8 C)  TempSrc: Oral Oral    SpO2: 95% 90% 94% 99%  Weight:      Height:       No data found.   Intake/Output Summary (Last 24 hours) at 11/06/2023 1306 Last data filed at 11/06/2023 0749 Gross per 24 hour  Intake 621.22 ml  Output 3550 ml  Net -2928.78 ml   Filed Weights   11/02/23 2126  Weight: 79.4 kg    Exam:  GEN: NAD SKIN: Warm and dry EYES: No pallor or icterus ENT: MMM CV: RRR PULM: Decreased air entry bilaterally, bilateral wheezing ABD: soft, ND, NT, +BS CNS: AAO x 3, non focal EXT: No edema or tenderness         Data Reviewed:   I  have personally reviewed following labs and imaging studies:  Labs: Labs show  the following:   Basic Metabolic Panel: Recent Labs  Lab 11/02/23 2136 11/03/23 0514 11/04/23 0525 11/05/23 0443 11/06/23 0501  NA 137 135 135 135 135  K 4.8 4.9 5.3* 5.4* 4.9  CL 105 106 108 105 103  CO2 23 21* 23 24 25   GLUCOSE 91 72 130* 157* 118*  BUN 39* 42* 48* 42* 30*  CREATININE 1.62* 1.90* 1.93* 1.55* 1.23  CALCIUM 9.2 8.6* 8.4* 8.8* 9.1   GFR Estimated Creatinine Clearance: 41.9 mL/min (by C-G formula based on SCr of 1.23 mg/dL). Liver Function Tests: Recent Labs  Lab 11/02/23 2136  AST 23  ALT 16  ALKPHOS 38  BILITOT 0.6  PROT 6.2*  ALBUMIN 3.9   No results for input(s): "LIPASE", "AMYLASE" in the last 168 hours. No results for input(s): "AMMONIA" in the last 168 hours. Coagulation profile Recent Labs  Lab 11/02/23 2136 11/03/23 0514  INR 1.2 1.4*    CBC: Recent Labs  Lab 11/02/23 2136 11/03/23 0514 11/04/23 0525 11/05/23 0443 11/06/23 0501  WBC 25.5* 21.3* 20.1* 16.8* 11.9*  NEUTROABS 22.4*  --   --  14.5* 9.9*  HGB 9.4* 8.5* 7.6* 7.9* 8.7*  HCT 29.1* 27.0* 23.3* 24.9* 27.2*  MCV 95.1 96.8 94.3 95.4 93.8  PLT 195 150 141* 141* 158   Cardiac Enzymes: Recent Labs  Lab 11/04/23 1742  CKTOTAL 208   BNP (last 3 results) No results for input(s): "PROBNP" in the last 8760 hours. CBG: Recent Labs  Lab 11/06/23 0743 11/06/23 1132  GLUCAP 138* 102*   D-Dimer: No results for input(s): "DDIMER" in the last 72 hours. Hgb A1c: No results for input(s): "HGBA1C" in the last 72 hours. Lipid Profile: No results for input(s): "CHOL", "HDL", "LDLCALC", "TRIG", "CHOLHDL", "LDLDIRECT" in the last 72 hours. Thyroid function studies: No results for input(s): "TSH", "T4TOTAL", "T3FREE", "THYROIDAB" in the last 72 hours.  Invalid input(s): "FREET3" Anemia work up: No results for input(s): "VITAMINB12", "FOLATE", "FERRITIN", "TIBC", "IRON", "RETICCTPCT" in the last 72  hours. Sepsis Labs: Recent Labs  Lab 11/02/23 2136 11/02/23 2334 11/03/23 0514 11/04/23 0525 11/05/23 0443 11/06/23 0501  PROCALCITON  --   --  4.72  --   --   --   WBC 25.5*  --  21.3* 20.1* 16.8* 11.9*  LATICACIDVEN 1.4 1.4  --   --   --   --     Microbiology Recent Results (from the past 240 hour(s))  Resp panel by RT-PCR (RSV, Flu A&B, Covid) Anterior Nasal Swab     Status: None   Collection Time: 11/02/23  9:37 PM   Specimen: Anterior Nasal Swab  Result Value Ref Range Status   SARS Coronavirus 2 by RT PCR NEGATIVE NEGATIVE Final    Comment: (NOTE) SARS-CoV-2 target nucleic acids are NOT DETECTED.  The SARS-CoV-2 RNA is generally detectable in upper respiratory specimens during the acute phase of infection. The lowest concentration of SARS-CoV-2 viral copies this assay can detect is 138 copies/mL. A negative result does not preclude SARS-Cov-2 infection and should not be used as the sole basis for treatment or other patient management decisions. A negative result may occur with  improper specimen collection/handling, submission of specimen other than nasopharyngeal swab, presence of viral mutation(s) within the areas targeted by this assay, and inadequate number of viral copies(<138 copies/mL). A negative result must be combined with clinical observations, patient history, and epidemiological information. The expected result is Negative.  Fact Sheet for Patients:  BloggerCourse.com  Fact Sheet for Healthcare  Providers:  SeriousBroker.it  This test is no t yet approved or cleared by the [place] and  has been authorized for detection and/or diagnosis of SARS-CoV-2 by FDA under an Emergency Use Authorization (EUA). This EUA will remain  in effect (meaning this test can be used) for the duration of the COVID-19 declaration under Section 564(b)(1) of the Act, 21 U.S.C.section 360bbb-3(b)(1), unless the  authorization is terminated  or revoked sooner.       Influenza A by PCR NEGATIVE NEGATIVE Final   Influenza B by PCR NEGATIVE NEGATIVE Final    Comment: (NOTE) The Xpert Xpress SARS-CoV-2/FLU/RSV plus assay is intended as an aid in the diagnosis of influenza from Nasopharyngeal swab specimens and should not be used as a sole basis for treatment. Nasal washings and aspirates are unacceptable for Xpert Xpress SARS-CoV-2/FLU/RSV testing.  Fact Sheet for Patients: BloggerCourse.com  Fact Sheet for Healthcare Providers: SeriousBroker.it  This test is not yet approved or cleared by the [place] FDA and has been authorized for detection and/or diagnosis of SARS-CoV-2 by FDA under an Emergency Use Authorization (EUA). This EUA will remain in effect (meaning this test can be used) for the duration of the COVID-19 declaration under Section 564(b)(1) of the Act, 21 U.S.C. section 360bbb-3(b)(1), unless the authorization is terminated or revoked.     Resp Syncytial Virus by PCR NEGATIVE NEGATIVE Final    Comment: (NOTE) Fact Sheet for Patients: BloggerCourse.com  Fact Sheet for Healthcare Providers: SeriousBroker.it  This test is not yet approved or cleared by the [place] FDA and has been authorized for detection and/or diagnosis of SARS-CoV-2 by FDA under an Emergency Use Authorization (EUA). This EUA will remain in effect (meaning this test can be used) for the duration of the COVID-19 declaration under Section 564(b)(1) of the Act, 21 U.S.C. section 360bbb-3(b)(1), unless the authorization is terminated or revoked.  Performed at [facility], [address] [place], [place] [address]   Blood Culture (routine x 2)     Status: None (Preliminary result)   Collection Time: 11/02/23  9:37 PM   Specimen: BLOOD  Result Value Ref Range Status   Specimen  Description BLOOD BLOOD LEFT ARM  Final   Special Requests   Final    BOTTLES DRAWN AEROBIC AND ANAEROBIC Blood Culture adequate volume   Culture   Final    NO GROWTH 4 DAYS Performed at [facility], [address], [place], [place] [address]    Report Status PENDING  Incomplete  Blood Culture (routine x 2)     Status: None (Preliminary result)   Collection Time: 11/02/23  9:37 PM   Specimen: BLOOD  Result Value Ref Range Status   Specimen Description BLOOD BLOOD RIGHT ARM  Final   Special Requests   Final    BOTTLES DRAWN AEROBIC AND ANAEROBIC Blood Culture adequate volume   Culture   Final    NO GROWTH 4 DAYS Performed at [facility], [address], [place], [place] [address]    Report Status PENDING  Incomplete    Procedures and diagnostic studies:  No results found.             LOS: 3 days   Phoenicia Pirie  [facility]   Pager on [email or web address]. If 7PM-7AM, please contact night-coverage at [email or web address]     11/06/2023, 1:06 PM

## 2023-11-07 DIAGNOSIS — A419 Sepsis, unspecified organism: Secondary | ICD-10-CM | POA: Diagnosis not present

## 2023-11-07 DIAGNOSIS — J189 Pneumonia, unspecified organism: Secondary | ICD-10-CM | POA: Diagnosis not present

## 2023-11-07 LAB — CULTURE, BLOOD (ROUTINE X 2)
Culture: NO GROWTH
Culture: NO GROWTH
Special Requests: ADEQUATE
Special Requests: ADEQUATE

## 2023-11-07 MED ORDER — SODIUM CHLORIDE 0.9 % IV SOLN: INTRAVENOUS | Status: AC | PRN

## 2023-11-07 MED ORDER — ORAL CARE MOUTH RINSE: 15.0000 mL | OROMUCOSAL | Status: DC | PRN

## 2023-11-07 NOTE — Progress Notes (Addendum)
Progress Note    Francisco Gibbs  GUR:[id number] DOB: [birth year]  DOA: 11/02/2023 PCP: Abram Sander, MD      Brief Narrative:    Medical records reviewed and are as summarized below:  Francisco Gibbs is a 89 y.o. male  with medical history significant for COPD, GERD, hypertension, dyslipidemia, type 2 diabetes mellitus, aortic stenosis, who presented to the hospital because of generalized weakness and fall.  He lost his balance and fell backward striking his head on the wall.  He was unable to stand up after the fall.  He laid on the ground for about 2 hours until his daughter came home and found him on the floor.  He also complained of cough productive of whitish sputum, shortness of breath and wheezing.         Assessment/Plan:   Principal Problem:   Sepsis due to pneumonia [facility]) Active Problems:   Acute respiratory failure with hypoxia (HCC)   Dyslipidemia   Type 2 diabetes mellitus with peripheral neuropathy (HCC)   Essential hypertension   Chronic obstructive pulmonary disease (HCC)   Depression   BPH (benign prostatic hyperplasia)    Body mass index is 24.79 kg/m.   Sepsis secondary to pneumonia: Completed 5 days of IV  antibiotics.  Incentive spirometry as needed.  No growth on blood cultures. Leukocytosis is improving.   Acute hypoxemic respiratory failure: Oxygen has been weaned down from 4 L to 2 L.  Continue weaning attempts.  He may need home oxygen if unable to wean off oxygen.    CKD stage 3b: Creatinine is stable Hyperkalemia: Improved   COPD with acute exacerbation: Continue prednisone and bronchodilators.  Antitussives as needed.    General weakness: PT recommend discharge to SNF.  Follow-up with transition of care team to assist with disposition.   Acute on chronic anemia: Hemoglobin up from 7.6-7.9-8.7.  No indication for blood transfusion at this time..    Comorbidities include hypertension, hyperlipidemia, type 2 diabetes mellitus,  BPH, mild to moderate aortic stenosis, depression   Discussed discharge plans with the patient.  He has agreed to go to SNF for short-term rehab.  At this time, patient has capacity to make decisions for himself.  He knows why he has been hospitalized.  He was able to tell me that he has "pneumonia" which has affected his breathing and that he was being treated with antibiotics and breathing treatment.  He understands that he is at high risk for falls because of recent illness and it may be unsafe for him to go home at this time.  He understands that he may get stronger after short term rehab and he may be able to transition home.  It is worth noting that mental status may fluctuate.     Diet Order             Diet Heart Room service appropriate? Yes; Fluid consistency: Thin  Diet effective now                            Consultants: None  Procedures: None    Medications:    aspirin  81 mg Oral Daily   atorvastatin  80 mg Oral QPM   enoxaparin (LOVENOX) injection  40 mg Subcutaneous Q24H   ferrous sulfate  325 mg Oral BID WC   fluticasone  2 spray Each Nare QPM   fluticasone furoate-vilanterol  1 puff Inhalation Daily  And   umeclidinium bromide  1 puff Inhalation Daily   gabapentin  300 mg Oral TID   guaiFENesin  600 mg Oral BID   hydrALAZINE  50 mg Oral QID   loratadine  10 mg Oral Daily   magnesium oxide  400 mg Oral Daily   multivitamin with minerals  1 tablet Oral Daily   pantoprazole  40 mg Oral Daily   predniSONE  40 mg Oral Q breakfast   sertraline  50 mg Oral BID   tamsulosin  0.4 mg Oral QPC supper   Continuous Infusions:  sodium chloride 10 mL/hr at 11/07/23 2130     Anti-infectives (From admission, onward)    Start     Dose/Rate Route Frequency Ordered Stop   11/07/23 0600  cefTRIAXone (ROCEPHIN) 1 g in sodium chloride 0.9 % 100 mL IVPB        1 g 200 mL/hr over 30 Minutes Intravenous  Once 11/06/23 0746 11/07/23 0608   11/04/23 0600   azithromycin (ZITHROMAX) 500 mg in dextrose 5 % 250 mL IVPB  Status:  Discontinued        500 mg 255 mL/hr over 60 Minutes Intravenous Every 24 hours 11/03/23 2321 11/06/23 0745   11/03/23 0800  cefTRIAXone (ROCEPHIN) 2 g in sodium chloride 0.9 % 100 mL IVPB  Status:  Discontinued        2 g 200 mL/hr over 30 Minutes Intravenous Every 24 hours 11/03/23 0222 11/06/23 0745   11/03/23 0800  azithromycin (ZITHROMAX) 500 mg in sodium chloride 0.9 % 250 mL IVPB  Status:  Discontinued        500 mg 250 mL/hr over 60 Minutes Intravenous Every 24 hours 11/03/23 0222 11/03/23 2321   11/02/23 2145  vancomycin (VANCOREADY) IVPB 1750 mg/350 mL        1,750 mg 175 mL/hr over 120 Minutes Intravenous Every 24 hours 11/02/23 2130 11/03/23 0244   11/02/23 2130  ceFEPIme (MAXIPIME) 2 g in sodium chloride 0.9 % 100 mL IVPB        2 g 200 mL/hr over 30 Minutes Intravenous  Once 11/02/23 2128 11/02/23 2234   11/02/23 2130  metroNIDAZOLE (FLAGYL) IVPB 500 mg        500 mg 100 mL/hr over 60 Minutes Intravenous  Once 11/02/23 2128 11/03/23 0025   11/02/23 2130  vancomycin (VANCOCIN) IVPB 1000 mg/200 mL premix  Status:  Discontinued        1,000 mg 200 mL/hr over 60 Minutes Intravenous  Once 11/02/23 2128 11/02/23 2130              Family Communication/Anticipated D/C date and plan/Code Status   DVT prophylaxis: enoxaparin (LOVENOX) injection 40 mg Start: 11/06/23 0800     Code Status: Full Code  Family Communication: None Disposition Plan: Plan to discharge home vs SNF   Status is: Inpatient Remains inpatient appropriate because: Sepsis from pneumonia       Subjective:   Interval events noted.  He feels better today.  He still has a cough but breathing is better.  He said he had already ordered breakfast but he had not received his breakfast tray at the time that I saw him this morning.   Objective:    Vitals:   11/07/23 0330 11/07/23 0400 11/07/23 0542 11/07/23 0751  BP:  (!)  143/63 (!) 141/68 (!) 169/86  Pulse:  88 86 92  Resp:  16 15   Temp:  97.8 F (36.6 C) (!) 97.5 F (36.4  C) 98.5 F (36.9 C)  TempSrc:  Axillary Axillary Oral  SpO2: 95% 95% 94% 96%  Weight: 71.8 kg     Height:       No data found.   Intake/Output Summary (Last 24 hours) at 11/07/2023 1000 Last data filed at 11/07/2023 0647 Gross per 24 hour  Intake 107.02 ml  Output 2650 ml  Net -2542.98 ml   Filed Weights   11/02/23 2126 11/07/23 0330  Weight: 79.4 kg 71.8 kg    Exam:  GEN: NAD SKIN: Warm and dry EYES: No pallor or icterus ENT: MMM, hearing impairment CV: RRR PULM: Decreased air entry bilaterally but no wheezing heard ABD: soft, ND, NT, +BS CNS: AAO x 3.  He was able to tell me his name and date of birth and the time on the clock ("27 minutes to time" ).  He knows today is Saturday and he knows he is at [facility] in [place]. EXT: No edema or tenderness      Data Reviewed:   I have personally reviewed following labs and imaging studies:  Labs: Labs show the following:   Basic Metabolic Panel: Recent Labs  Lab 11/02/23 2136 11/03/23 0514 11/04/23 0525 11/05/23 0443 11/06/23 0501  NA 137 135 135 135 135  K 4.8 4.9 5.3* 5.4* 4.9  CL 105 106 108 105 103  CO2 23 21* 23 24 25   GLUCOSE 91 72 130* 157* 118*  BUN 39* 42* 48* 42* 30*  CREATININE 1.62* 1.90* 1.93* 1.55* 1.23  CALCIUM 9.2 8.6* 8.4* 8.8* 9.1   GFR Estimated Creatinine Clearance: 38.8 mL/min (by C-G formula based on SCr of 1.23 mg/dL). Liver Function Tests: Recent Labs  Lab 11/02/23 2136  AST 23  ALT 16  ALKPHOS 38  BILITOT 0.6  PROT 6.2*  ALBUMIN 3.9   No results for input(s): "LIPASE", "AMYLASE" in the last 168 hours. No results for input(s): "AMMONIA" in the last 168 hours. Coagulation profile Recent Labs  Lab 11/02/23 2136 11/03/23 0514  INR 1.2 1.4*    CBC: Recent Labs  Lab 11/02/23 2136 11/03/23 0514 11/04/23 0525 11/05/23 0443 11/06/23 0501  WBC  25.5* 21.3* 20.1* 16.8* 11.9*  NEUTROABS 22.4*  --   --  14.5* 9.9*  HGB 9.4* 8.5* 7.6* 7.9* 8.7*  HCT 29.1* 27.0* 23.3* 24.9* 27.2*  MCV 95.1 96.8 94.3 95.4 93.8  PLT 195 150 141* 141* 158   Cardiac Enzymes: Recent Labs  Lab 11/04/23 1742  CKTOTAL 208   BNP (last 3 results) No results for input(s): "PROBNP" in the last 8760 hours. CBG: Recent Labs  Lab 11/06/23 0743 11/06/23 1132 11/06/23 1621  GLUCAP 138* 102* 149*   D-Dimer: No results for input(s): "DDIMER" in the last 72 hours. Hgb A1c: No results for input(s): "HGBA1C" in the last 72 hours. Lipid Profile: No results for input(s): "CHOL", "HDL", "LDLCALC", "TRIG", "CHOLHDL", "LDLDIRECT" in the last 72 hours. Thyroid function studies: No results for input(s): "TSH", "T4TOTAL", "T3FREE", "THYROIDAB" in the last 72 hours.  Invalid input(s): "FREET3" Anemia work up: No results for input(s): "VITAMINB12", "FOLATE", "FERRITIN", "TIBC", "IRON", "RETICCTPCT" in the last 72 hours. Sepsis Labs: Recent Labs  Lab 11/02/23 2136 11/02/23 2334 11/03/23 0514 11/04/23 0525 11/05/23 0443 11/06/23 0501  PROCALCITON  --   --  4.72  --   --   --   WBC 25.5*  --  21.3* 20.1* 16.8* 11.9*  LATICACIDVEN 1.4 1.4  --   --   --   --  Microbiology Recent Results (from the past 240 hour(s))  Resp panel by RT-PCR (RSV, Flu A&B, Covid) Anterior Nasal Swab     Status: None   Collection Time: 11/02/23  9:37 PM   Specimen: Anterior Nasal Swab  Result Value Ref Range Status   SARS Coronavirus 2 by RT PCR NEGATIVE NEGATIVE Final    Comment: (NOTE) SARS-CoV-2 target nucleic acids are NOT DETECTED.  The SARS-CoV-2 RNA is generally detectable in upper respiratory specimens during the acute phase of infection. The lowest concentration of SARS-CoV-2 viral copies this assay can detect is 138 copies/mL. A negative result does not preclude SARS-Cov-2 infection and should not be used as the sole basis for treatment or other patient  management decisions. A negative result may occur with  improper specimen collection/handling, submission of specimen other than nasopharyngeal swab, presence of viral mutation(s) within the areas targeted by this assay, and inadequate number of viral copies(<138 copies/mL). A negative result must be combined with clinical observations, patient history, and epidemiological information. The expected result is Negative.  Fact Sheet for Patients:  BloggerCourse.com  Fact Sheet for Healthcare Providers:  SeriousBroker.it  This test is no t yet approved or cleared by the [place] FDA and  has been authorized for detection and/or diagnosis of SARS-CoV-2 by FDA under an Emergency Use Authorization (EUA). This EUA will remain  in effect (meaning this test can be used) for the duration of the COVID-19 declaration under Section 564(b)(1) of the Act, 21 U.S.C.section 360bbb-3(b)(1), unless the authorization is terminated  or revoked sooner.       Influenza A by PCR NEGATIVE NEGATIVE Final   Influenza B by PCR NEGATIVE NEGATIVE Final    Comment: (NOTE) The Xpert Xpress SARS-CoV-2/FLU/RSV plus assay is intended as an aid in the diagnosis of influenza from Nasopharyngeal swab specimens and should not be used as a sole basis for treatment. Nasal washings and aspirates are unacceptable for Xpert Xpress SARS-CoV-2/FLU/RSV testing.  Fact Sheet for Patients: BloggerCourse.com  Fact Sheet for Healthcare Providers: SeriousBroker.it  This test is not yet approved or cleared by the [place] FDA and has been authorized for detection and/or diagnosis of SARS-CoV-2 by FDA under an Emergency Use Authorization (EUA). This EUA will remain in effect (meaning this test can be used) for the duration of the COVID-19 declaration under Section 564(b)(1) of the Act, 21 U.S.C. section 360bbb-3(b)(1),  unless the authorization is terminated or revoked.     Resp Syncytial Virus by PCR NEGATIVE NEGATIVE Final    Comment: (NOTE) Fact Sheet for Patients: BloggerCourse.com  Fact Sheet for Healthcare Providers: SeriousBroker.it  This test is not yet approved or cleared by the [place] FDA and has been authorized for detection and/or diagnosis of SARS-CoV-2 by FDA under an Emergency Use Authorization (EUA). This EUA will remain in effect (meaning this test can be used) for the duration of the COVID-19 declaration under Section 564(b)(1) of the Act, 21 U.S.C. section 360bbb-3(b)(1), unless the authorization is terminated or revoked.  Performed at [facility], [address] [place], [place] [address]   Blood Culture (routine x 2)     Status: None   Collection Time: 11/02/23  9:37 PM   Specimen: BLOOD  Result Value Ref Range Status   Specimen Description BLOOD BLOOD LEFT ARM  Final   Special Requests   Final    BOTTLES DRAWN AEROBIC AND ANAEROBIC Blood Culture adequate volume   Culture   Final    NO GROWTH 5 DAYS  Performed at [facility], [address] [place], [place] [address]    Report Status 11/07/2023 FINAL  Final  Blood Culture (routine x 2)     Status: None   Collection Time: 11/02/23  9:37 PM   Specimen: BLOOD  Result Value Ref Range Status   Specimen Description BLOOD BLOOD RIGHT ARM  Final   Special Requests   Final    BOTTLES DRAWN AEROBIC AND ANAEROBIC Blood Culture adequate volume   Culture   Final    NO GROWTH 5 DAYS Performed at [facility], [address], [place], [place] [address]    Report Status 11/07/2023 FINAL  Final    Procedures and diagnostic studies:  No results found.             LOS: 4 days   Mayson Mcneish  [facility]   Pager on [email or web address]. If 7PM-7AM, please contact night-coverage at [email or web address]     11/07/2023, 10:00 AM

## 2023-11-08 DIAGNOSIS — J189 Pneumonia, unspecified organism: Secondary | ICD-10-CM | POA: Diagnosis not present

## 2023-11-08 DIAGNOSIS — A419 Sepsis, unspecified organism: Secondary | ICD-10-CM | POA: Diagnosis not present

## 2023-11-08 NOTE — Plan of Care (Signed)
Progressing towards goals

## 2023-11-08 NOTE — Progress Notes (Signed)
Progress Note    Francisco Gibbs  ZOX:[id number] DOB: 15-Nov-1933  DOA: 11/02/2023 PCP: Abram Sander, MD      Brief Narrative:    Medical records reviewed and are as summarized below:  Francisco Gibbs is a 89 y.o. male  with medical history significant for COPD, GERD, hypertension, dyslipidemia, type 2 diabetes mellitus, aortic stenosis, who presented to the hospital because of generalized weakness and fall.  He lost his balance and fell backward striking his head on the wall.  He was unable to stand up after the fall.  He laid on the ground for about 2 hours until his daughter came home and found him on the floor.  He also complained of cough productive of whitish sputum, shortness of breath and wheezing.         Assessment/Plan:   Principal Problem:   Sepsis due to pneumonia [facility]) Active Problems:   Acute respiratory failure with hypoxia (HCC)   Dyslipidemia   Type 2 diabetes mellitus with peripheral neuropathy (HCC)   Essential hypertension   Chronic obstructive pulmonary disease (HCC)   Depression   BPH (benign prostatic hyperplasia)    Body mass index is 24.79 kg/m.   Sepsis secondary to pneumonia: Completed 5 days of IV  antibiotics.  Incentive spirometry as needed.  No growth on blood cultures. Leukocytosis is improving.   Acute hypoxemic respiratory failure: Resolved.  He is tolerating room air.   CKD stage 3b: Creatinine is stable Hyperkalemia: Improved   COPD with acute exacerbation: Continue prednisone and bronchodilators.  Antitussives as needed.    General weakness: PT recommend discharge to SNF.  Follow-up with transition of care team to assist with disposition.   Acute on chronic anemia: Hemoglobin up from 7.6-7.9-8.7.  No indication for blood transfusion at this time..    Comorbidities include hypertension, hyperlipidemia, type 2 diabetes mellitus, BPH, mild to moderate aortic stenosis, depression   Disposition: Discussed PT's  recommendation for discharge to SNF.  Patient is agreeable to go to rehab at this time.  He was grateful and appreciative of the care he has received thus far.  He offered a handshake and thanked me for looking out for his best interest.     Diet Order             Diet Heart Room service appropriate? Yes; Fluid consistency: Thin  Diet effective now                            Consultants: None  Procedures: None    Medications:    aspirin  81 mg Oral Daily   atorvastatin  80 mg Oral QPM   enoxaparin (LOVENOX) injection  40 mg Subcutaneous Q24H   ferrous sulfate  325 mg Oral BID WC   fluticasone  2 spray Each Nare QPM   fluticasone furoate-vilanterol  1 puff Inhalation Daily   And   umeclidinium bromide  1 puff Inhalation Daily   gabapentin  300 mg Oral TID   guaiFENesin  600 mg Oral BID   hydrALAZINE  50 mg Oral QID   loratadine  10 mg Oral Daily   magnesium oxide  400 mg Oral Daily   multivitamin with minerals  1 tablet Oral Daily   pantoprazole  40 mg Oral Daily   predniSONE  40 mg Oral Q breakfast   sertraline  50 mg Oral BID   tamsulosin  0.4 mg Oral QPC  supper   Continuous Infusions:     Anti-infectives (From admission, onward)    Start     Dose/Rate Route Frequency Ordered Stop   11/07/23 0600  cefTRIAXone (ROCEPHIN) 1 g in sodium chloride 0.9 % 100 mL IVPB        1 g 200 mL/hr over 30 Minutes Intravenous  Once 11/06/23 0746 11/07/23 0608   11/04/23 0600  azithromycin (ZITHROMAX) 500 mg in dextrose 5 % 250 mL IVPB  Status:  Discontinued        500 mg 255 mL/hr over 60 Minutes Intravenous Every 24 hours 11/03/23 2321 11/06/23 0745   11/03/23 0800  cefTRIAXone (ROCEPHIN) 2 g in sodium chloride 0.9 % 100 mL IVPB  Status:  Discontinued        2 g 200 mL/hr over 30 Minutes Intravenous Every 24 hours 11/03/23 0222 11/06/23 0745   11/03/23 0800  azithromycin (ZITHROMAX) 500 mg in sodium chloride 0.9 % 250 mL IVPB  Status:  Discontinued        500  mg 250 mL/hr over 60 Minutes Intravenous Every 24 hours 11/03/23 0222 11/03/23 2321   11/02/23 2145  vancomycin (VANCOREADY) IVPB 1750 mg/350 mL        1,750 mg 175 mL/hr over 120 Minutes Intravenous Every 24 hours 11/02/23 2130 11/03/23 0244   11/02/23 2130  ceFEPIme (MAXIPIME) 2 g in sodium chloride 0.9 % 100 mL IVPB        2 g 200 mL/hr over 30 Minutes Intravenous  Once 11/02/23 2128 11/02/23 2234   11/02/23 2130  metroNIDAZOLE (FLAGYL) IVPB 500 mg        500 mg 100 mL/hr over 60 Minutes Intravenous  Once 11/02/23 2128 11/03/23 0025   11/02/23 2130  vancomycin (VANCOCIN) IVPB 1000 mg/200 mL premix  Status:  Discontinued        1,000 mg 200 mL/hr over 60 Minutes Intravenous  Once 11/02/23 2128 11/02/23 2130              Family Communication/Anticipated D/C date and plan/Code Status   DVT prophylaxis: enoxaparin (LOVENOX) injection 40 mg Start: 11/06/23 0800     Code Status: Full Code  Family Communication: None Disposition Plan: Plan to discharge home vs SNF   Status is: Inpatient Remains inpatient appropriate because: Sepsis from pneumonia       Subjective:   Interval events noted.  No complaints.  No shortness of breath or chest pain.  He has been weaned off of oxygen.  Cough is better.   Objective:    Vitals:   11/08/23 0034 11/08/23 0451 11/08/23 0801 11/08/23 1218  BP: 124/64 126/64 135/78 (!) 145/71  Pulse: 90 88 82 93  Resp: 19 19    Temp: 98.2 F (36.8 C) 98.1 F (36.7 C) 97.7 F (36.5 C) 98.7 F (37.1 C)  TempSrc: Oral Oral  Oral  SpO2: 95% 95% 96% 94%  Weight:      Height:       No data found.   Intake/Output Summary (Last 24 hours) at 11/08/2023 1408 Last data filed at 11/08/2023 0452 Gross per 24 hour  Intake 86.91 ml  Output 1450 ml  Net -1363.09 ml   Filed Weights   11/02/23 2126 11/07/23 0330  Weight: 79.4 kg 71.8 kg    Exam:   GEN: NAD SKIN: Warm and dry EYES: No pallor or icterus ENT: MMM CV: RRR PULM: Air entry  adequate bilaterally, no wheezing or rales ABD: soft, ND, NT, +BS CNS: AAO  x 3, non focal EXT: No edema or tenderness      Data Reviewed:   I have personally reviewed following labs and imaging studies:  Labs: Labs show the following:   Basic Metabolic Panel: Recent Labs  Lab 11/02/23 2136 11/03/23 0514 11/04/23 0525 11/05/23 0443 11/06/23 0501  NA 137 135 135 135 135  K 4.8 4.9 5.3* 5.4* 4.9  CL 105 106 108 105 103  CO2 23 21* 23 24 25   GLUCOSE 91 72 130* 157* 118*  BUN 39* 42* 48* 42* 30*  CREATININE 1.62* 1.90* 1.93* 1.55* 1.23  CALCIUM 9.2 8.6* 8.4* 8.8* 9.1   GFR Estimated Creatinine Clearance: 38.8 mL/min (by C-G formula based on SCr of 1.23 mg/dL). Liver Function Tests: Recent Labs  Lab 11/02/23 2136  AST 23  ALT 16  ALKPHOS 38  BILITOT 0.6  PROT 6.2*  ALBUMIN 3.9   No results for input(s): "LIPASE", "AMYLASE" in the last 168 hours. No results for input(s): "AMMONIA" in the last 168 hours. Coagulation profile Recent Labs  Lab 11/02/23 2136 11/03/23 0514  INR 1.2 1.4*    CBC: Recent Labs  Lab 11/02/23 2136 11/03/23 0514 11/04/23 0525 11/05/23 0443 11/06/23 0501  WBC 25.5* 21.3* 20.1* 16.8* 11.9*  NEUTROABS 22.4*  --   --  14.5* 9.9*  HGB 9.4* 8.5* 7.6* 7.9* 8.7*  HCT 29.1* 27.0* 23.3* 24.9* 27.2*  MCV 95.1 96.8 94.3 95.4 93.8  PLT 195 150 141* 141* 158   Cardiac Enzymes: Recent Labs  Lab 11/04/23 1742  CKTOTAL 208   BNP (last 3 results) No results for input(s): "PROBNP" in the last 8760 hours. CBG: Recent Labs  Lab 11/06/23 0743 11/06/23 1132 11/06/23 1621  GLUCAP 138* 102* 149*   D-Dimer: No results for input(s): "DDIMER" in the last 72 hours. Hgb A1c: No results for input(s): "HGBA1C" in the last 72 hours. Lipid Profile: No results for input(s): "CHOL", "HDL", "LDLCALC", "TRIG", "CHOLHDL", "LDLDIRECT" in the last 72 hours. Thyroid function studies: No results for input(s): "TSH", "T4TOTAL", "T3FREE", "THYROIDAB" in  the last 72 hours.  Invalid input(s): "FREET3" Anemia work up: No results for input(s): "VITAMINB12", "FOLATE", "FERRITIN", "TIBC", "IRON", "RETICCTPCT" in the last 72 hours. Sepsis Labs: Recent Labs  Lab 11/02/23 2136 11/02/23 2334 11/03/23 0514 11/04/23 0525 11/05/23 0443 11/06/23 0501  PROCALCITON  --   --  4.72  --   --   --   WBC 25.5*  --  21.3* 20.1* 16.8* 11.9*  LATICACIDVEN 1.4 1.4  --   --   --   --     Microbiology Recent Results (from the past 240 hour(s))  Resp panel by RT-PCR (RSV, Flu A&B, Covid) Anterior Nasal Swab     Status: None   Collection Time: 11/02/23  9:37 PM   Specimen: Anterior Nasal Swab  Result Value Ref Range Status   SARS Coronavirus 2 by RT PCR NEGATIVE NEGATIVE Final    Comment: (NOTE) SARS-CoV-2 target nucleic acids are NOT DETECTED.  The SARS-CoV-2 RNA is generally detectable in upper respiratory specimens during the acute phase of infection. The lowest concentration of SARS-CoV-2 viral copies this assay can detect is 138 copies/mL. A negative result does not preclude SARS-Cov-2 infection and should not be used as the sole basis for treatment or other patient management decisions. A negative result may occur with  improper specimen collection/handling, submission of specimen other than nasopharyngeal swab, presence of viral mutation(s) within the areas targeted by this assay, and inadequate number of  viral copies(<138 copies/mL). A negative result must be combined with clinical observations, patient history, and epidemiological information. The expected result is Negative.  Fact Sheet for Patients:  BloggerCourse.com  Fact Sheet for Healthcare Providers:  SeriousBroker.it  This test is no t yet approved or cleared by the [place] FDA and  has been authorized for detection and/or diagnosis of SARS-CoV-2 by FDA under an Emergency Use Authorization (EUA). This EUA will remain  in  effect (meaning this test can be used) for the duration of the COVID-19 declaration under Section 564(b)(1) of the Act, 21 U.S.C.section 360bbb-3(b)(1), unless the authorization is terminated  or revoked sooner.       Influenza A by PCR NEGATIVE NEGATIVE Final   Influenza B by PCR NEGATIVE NEGATIVE Final    Comment: (NOTE) The Xpert Xpress SARS-CoV-2/FLU/RSV plus assay is intended as an aid in the diagnosis of influenza from Nasopharyngeal swab specimens and should not be used as a sole basis for treatment. Nasal washings and aspirates are unacceptable for Xpert Xpress SARS-CoV-2/FLU/RSV testing.  Fact Sheet for Patients: BloggerCourse.com  Fact Sheet for Healthcare Providers: SeriousBroker.it  This test is not yet approved or cleared by the [place] FDA and has been authorized for detection and/or diagnosis of SARS-CoV-2 by FDA under an Emergency Use Authorization (EUA). This EUA will remain in effect (meaning this test can be used) for the duration of the COVID-19 declaration under Section 564(b)(1) of the Act, 21 U.S.C. section 360bbb-3(b)(1), unless the authorization is terminated or revoked.     Resp Syncytial Virus by PCR NEGATIVE NEGATIVE Final    Comment: (NOTE) Fact Sheet for Patients: BloggerCourse.com  Fact Sheet for Healthcare Providers: SeriousBroker.it  This test is not yet approved or cleared by the [place] FDA and has been authorized for detection and/or diagnosis of SARS-CoV-2 by FDA under an Emergency Use Authorization (EUA). This EUA will remain in effect (meaning this test can be used) for the duration of the COVID-19 declaration under Section 564(b)(1) of the Act, 21 U.S.C. section 360bbb-3(b)(1), unless the authorization is terminated or revoked.  Performed at [facility], [address] [place], [place] [address]   Blood  Culture (routine x 2)     Status: None   Collection Time: 11/02/23  9:37 PM   Specimen: BLOOD  Result Value Ref Range Status   Specimen Description BLOOD BLOOD LEFT ARM  Final   Special Requests   Final    BOTTLES DRAWN AEROBIC AND ANAEROBIC Blood Culture adequate volume   Culture   Final    NO GROWTH 5 DAYS Performed at [facility], [address], [place], [place] [address]    Report Status 11/07/2023 FINAL  Final  Blood Culture (routine x 2)     Status: None   Collection Time: 11/02/23  9:37 PM   Specimen: BLOOD  Result Value Ref Range Status   Specimen Description BLOOD BLOOD RIGHT ARM  Final   Special Requests   Final    BOTTLES DRAWN AEROBIC AND ANAEROBIC Blood Culture adequate volume   Culture   Final    NO GROWTH 5 DAYS Performed at [facility], [address], [place], [place] [address]    Report Status 11/07/2023 FINAL  Final    Procedures and diagnostic studies:  No results found.             LOS: 5 days   Veryl Winemiller  [facility]   Pager on [email or web address]. If 7PM-7AM, please contact  night-coverage at [email or web address]     11/08/2023, 2:08 PM

## 2023-11-09 DIAGNOSIS — A419 Sepsis, unspecified organism: Secondary | ICD-10-CM | POA: Diagnosis not present

## 2023-11-09 DIAGNOSIS — J189 Pneumonia, unspecified organism: Secondary | ICD-10-CM | POA: Diagnosis not present

## 2023-11-09 MED ORDER — ALUM & MAG HYDROXIDE-SIMETH 200-200-20 MG/5ML PO SUSP
15.0000 mL | Freq: Four times a day (QID) | ORAL | Status: DC | PRN
  Administered 2023-11-09: 15 mL via ORAL
  Filled 2023-11-09: qty 30

## 2023-11-09 NOTE — Progress Notes (Signed)
Physical Therapy Treatment Patient Details Name: Francisco Gibbs MRN: [id number] DOB: Apr 17, 1933 Today's Date: 11/09/2023   History of Present Illness Pt is an 89 y.o. male  with medical history significant for COPD, GERD, hypertension, dyslipidemia, CKD, type 2 diabetes mellitus, aortic stenosis, who presented to the hospital because of generalized weakness and fall. Workup for sepsis due to PNA.    PT Comments  Pt was pleasant and motivated to participate during the session and put forth good effort throughout. Pt presented in bed on RA, SpO2 between 88-90% with bed mobility. Pt completed bed mobility with mod I for extra time with no real difficulty. At EOB, pt put on 1L O2, and completed STS with CGA for safety due to impulsive movements and needed cuing to complete safely/efficiently. Pt exhibiting posterior lean during transfers and initial standing that improved with cuing. Pt ambulated with RW and CGA, needing occasional min A for RW management during turns/close spaces, exhibiting decreased safety awareness. SpO2 recorded at 91% immediately following gait on 1L O2, pt left on 1L and RN informed. Pt reported no adverse symptoms throughout session. Pt will benefit from continued PT services upon discharge to safely address deficits listed in patient problem list for decreased caregiver assistance and eventual return to PLOF.    If plan is discharge home, recommend the following: A little help with bathing/dressing/bathroom;Assistance with cooking/housework;Assist for transportation;Help with stairs or ramp for entrance;A little help with walking and/or transfers   Can travel by private vehicle        Equipment Recommendations  Other (comment) (TBD at next venue of care)    Recommendations for Other Services       Precautions / Restrictions Precautions Precautions: Fall Restrictions Weight Bearing Restrictions: No     Mobility  Bed Mobility Overal bed mobility: Modified  Independent Bed Mobility: Supine to Sit     Supine to sit: HOB elevated, Modified independent (Device/Increase time)     General bed mobility comments: extra time, no difficulty    Transfers Overall transfer level: Needs assistance Equipment used: Rolling walker (2 wheels) Transfers: Sit to/from Stand Sit to Stand: Contact guard assist           General transfer comment: initial attempt unsuccessful due to posterior lean, exhibiting impulsive movements with quickly alternating sequencing methods, with cuing for hand placement and forward lean - able to complete slowly with mild unsteadiness, coming up to RW with some jerky movements and re-adjusting Depends; capable strength but decreased safety due to impulsivity    Ambulation/Gait Ambulation/Gait assistance: Contact guard assist, Min assist Gait Distance (Feet): 180 Feet Assistive device: Rolling walker (2 wheels) Gait Pattern/deviations: Step-through pattern, Decreased stride length, Trunk flexed Gait velocity: intermittently decreased     General Gait Details: inconsistent cadence, initial smaller step lengths but increased with distance, fairly steady with RW but occasional mild stagger 2/2 being distracted, needing redirection/heavy cuing to manage RW and remain safe throughout due to impulsivity and lack of engagement with task, tendency to let RW drift anteriorly requiring heavy cuing to correct/stay within RW, occasional assist needed at RW at beginning of gait, lack of adherence to cuing   Stairs             Wheelchair Mobility     Tilt Bed    Modified Rankin (Stroke Patients Only)       Balance Overall balance assessment: Needs assistance Sitting-balance support: Feet supported Sitting balance-Leahy Scale: Good Sitting balance - Comments: able to effectively don depends  in sitting without assist Postural control: Posterior lean Standing balance support: Bilateral upper extremity supported, During  functional activity Standing balance-Leahy Scale: Fair Standing balance comment: initially mild-mod unsteady in standing due to posterior lean, multimodal cuing to eventually correct; Once attentive and using RW appropriately, steady in static stand; fair unsteadiness with dynamic standing during turns, close spaces                            Cognition Arousal: Alert Behavior During Therapy: WFL for tasks assessed/performed, Impulsive Overall Cognitive Status: Within Functional Limits for tasks assessed                                 General Comments: oriented to name, DOB, location, but some questionable responses during conversation and easily distracted        Exercises      General Comments        Pertinent Vitals/Pain Pain Assessment Pain Assessment: No/denies pain Pain Intervention(s): Monitored during session    Home Living                          Prior Function            PT Goals (current goals can now be found in the care plan section) Acute Rehab PT Goals Patient Stated Goal: to go home PT Goal Formulation: With patient Time For Goal Achievement: 11/19/23 Potential to Achieve Goals: Good Progress towards PT goals: Progressing toward goals    Frequency    Min 1X/week      PT Plan      Co-evaluation              AM-PAC PT "6 Clicks" Mobility   Outcome Measure  Help needed turning from your back to your side while in a flat bed without using bedrails?: None Help needed moving from lying on your back to sitting on the side of a flat bed without using bedrails?: A Little Help needed moving to and from a bed to a chair (including a wheelchair)?: A Little Help needed standing up from a chair using your arms (e.g., wheelchair or bedside chair)?: None Help needed to walk in hospital room?: A Little Help needed climbing 3-5 steps with a railing? : A Little 6 Click Score: 20    End of Session Equipment  Utilized During Treatment: Oxygen (1L) Activity Tolerance: Patient tolerated treatment well;Patient limited by fatigue Patient left: in chair;with call bell/phone within reach;with chair alarm set Nurse Communication: Mobility status;Other (comment) (SpO2) PT Visit Diagnosis: Other abnormalities of gait and mobility (R26.89);Difficulty in walking, not elsewhere classified (R26.2);Muscle weakness (generalized) (M62.81)     Time: 2841-3244 PT Time Calculation (min) (ACUTE ONLY): 28 min  Charges:    $Therapeutic Activity: 23-37 mins PT General Charges $$ ACUTE PT VISIT: 1 Visit                    Rosiland Oz SPT 11/09/23, 3:18 PM

## 2023-11-09 NOTE — Progress Notes (Addendum)
Progress Note    ROMMELL MCCUMBERS  ZOX:[id number] DOB: 1933-02-06  DOA: 11/02/2023 PCP: Abram Sander, MD      Brief Narrative:    Medical records reviewed and are as summarized below:  KHORY MAZZARESE is a 89 y.o. male  with medical history significant for COPD, GERD, hypertension, dyslipidemia, type 2 diabetes mellitus, aortic stenosis, who presented to the hospital because of generalized weakness and fall.  He lost his balance and fell backward striking his head on the wall.  He was unable to stand up after the fall.  He laid on the ground for about 2 hours until his daughter came home and found him on the floor.  He also complained of cough productive of whitish sputum, shortness of breath and wheezing.         Assessment/Plan:   Principal Problem:   Sepsis due to pneumonia [facility]) Active Problems:   Acute respiratory failure with hypoxia (HCC)   Dyslipidemia   Type 2 diabetes mellitus with peripheral neuropathy (HCC)   Essential hypertension   Chronic obstructive pulmonary disease (HCC)   Depression   BPH (benign prostatic hyperplasia)    Body mass index is 24.79 kg/m.   Sepsis secondary to pneumonia: Completed 5 days of IV  antibiotics.  Incentive spirometry as needed.  No growth on blood cultures. Leukocytosis is improving.   Acute hypoxemic respiratory failure: He was back on 1 L/min oxygen this afternoon.  May have to check pulse oximetry with ambulation   CKD stage 3b: Creatinine is stable Hyperkalemia: Improved   COPD with acute exacerbation: Plan to complete prednisone tomorrow.  Continue bronchodilators.  Antitussives as needed.    General weakness: PT recommend discharge to SNF.  Follow-up with transition of care team to assist with disposition.   Acute on chronic anemia: Hemoglobin up from 7.6-7.9-8.7.  No indication for blood transfusion at this time..    Comorbidities include hypertension, hyperlipidemia, type 2 diabetes mellitus, BPH, mild  to moderate aortic stenosis, depression   Disposition: Discussed discharge plans with the patient this morning.  He was agreeable to go to rehab.  However, later in the day, he had told nursing staff that he would not go to SNF.   I called Ms. Elton Sin (niece) at 4:38 pm today but there was no response.    ADDENDUM:   I spoke with Misty Stanley at 6:25 pm today. We discussed plan of care.  She understands that Mr. Barasch is medically stable for discharge but awaiting placement.  However, she said Mr. Folan has informed her that he prefers to go home rather than go to SNF.  She has decided to honor his wishes.  She understands that he is at risk for falls.  Patient will likely be discharged home tomorrow with home health therapy.  She agrees with the plan.  Diet Order             Diet Heart Room service appropriate? Yes; Fluid consistency: Thin  Diet effective now                            Consultants: None  Procedures: None    Medications:    aspirin  81 mg Oral Daily   atorvastatin  80 mg Oral QPM   enoxaparin (LOVENOX) injection  40 mg Subcutaneous Q24H   ferrous sulfate  325 mg Oral BID WC   fluticasone  2 spray Each Nare QPM  fluticasone furoate-vilanterol  1 puff Inhalation Daily   And   umeclidinium bromide  1 puff Inhalation Daily   gabapentin  300 mg Oral TID   guaiFENesin  600 mg Oral BID   hydrALAZINE  50 mg Oral QID   loratadine  10 mg Oral Daily   magnesium oxide  400 mg Oral Daily   multivitamin with minerals  1 tablet Oral Daily   pantoprazole  40 mg Oral Daily   predniSONE  40 mg Oral Q breakfast   sertraline  50 mg Oral BID   tamsulosin  0.4 mg Oral QPC supper   Continuous Infusions:     Anti-infectives (From admission, onward)    Start     Dose/Rate Route Frequency Ordered Stop   11/07/23 0600  cefTRIAXone (ROCEPHIN) 1 g in sodium chloride 0.9 % 100 mL IVPB        1 g 200 mL/hr over 30 Minutes Intravenous  Once 11/06/23 0746  11/07/23 0608   11/04/23 0600  azithromycin (ZITHROMAX) 500 mg in dextrose 5 % 250 mL IVPB  Status:  Discontinued        500 mg 255 mL/hr over 60 Minutes Intravenous Every 24 hours 11/03/23 2321 11/06/23 0745   11/03/23 0800  cefTRIAXone (ROCEPHIN) 2 g in sodium chloride 0.9 % 100 mL IVPB  Status:  Discontinued        2 g 200 mL/hr over 30 Minutes Intravenous Every 24 hours 11/03/23 0222 11/06/23 0745   11/03/23 0800  azithromycin (ZITHROMAX) 500 mg in sodium chloride 0.9 % 250 mL IVPB  Status:  Discontinued        500 mg 250 mL/hr over 60 Minutes Intravenous Every 24 hours 11/03/23 0222 11/03/23 2321   11/02/23 2145  vancomycin (VANCOREADY) IVPB 1750 mg/350 mL        1,750 mg 175 mL/hr over 120 Minutes Intravenous Every 24 hours 11/02/23 2130 11/03/23 0244   11/02/23 2130  ceFEPIme (MAXIPIME) 2 g in sodium chloride 0.9 % 100 mL IVPB        2 g 200 mL/hr over 30 Minutes Intravenous  Once 11/02/23 2128 11/02/23 2234   11/02/23 2130  metroNIDAZOLE (FLAGYL) IVPB 500 mg        500 mg 100 mL/hr over 60 Minutes Intravenous  Once 11/02/23 2128 11/03/23 0025   11/02/23 2130  vancomycin (VANCOCIN) IVPB 1000 mg/200 mL premix  Status:  Discontinued        1,000 mg 200 mL/hr over 60 Minutes Intravenous  Once 11/02/23 2128 11/02/23 2130              Family Communication/Anticipated D/C date and plan/Code Status   DVT prophylaxis: enoxaparin (LOVENOX) injection 40 mg Start: 11/06/23 0800     Code Status: Full Code  Family Communication: None Disposition Plan: Plan to discharge home vs SNF   Status is: Inpatient Remains inpatient appropriate because: Sepsis from pneumonia       Subjective:   Interval events noted.  He has no complaints.  No shortness of breath or chest pain.  Cough has improved.  He was back on 1 L oxygen when I saw him this morning.   Objective:    Vitals:   11/09/23 0427 11/09/23 0830 11/09/23 1412 11/09/23 1558  BP: (!) 156/63 (!) 143/58 (!) 144/65  (!) 122/59  Pulse: 90 90 91 92  Resp: 19 18 18 17   Temp: 98 F (36.7 C) 98.8 F (37.1 C) 98.8 F (37.1 C) 99.6 F (37.6 C)  TempSrc: Oral Oral Oral Oral  SpO2: 93% 92% (!) 89% 97%  Weight:      Height:       No data found.   Intake/Output Summary (Last 24 hours) at 11/09/2023 1621 Last data filed at 11/09/2023 1014 Gross per 24 hour  Intake --  Output 1300 ml  Net -1300 ml   Filed Weights   11/02/23 2126 11/07/23 0330  Weight: 79.4 kg 71.8 kg    Exam:  GEN: No acute respiratory distress SKIN: Warm and dry EYES: No pallor or icterus ENT: MMM CV: RRR PULM: CTA B ABD: soft, ND, NT, +BS CNS: AAO x 3, non focal EXT: No edema or tenderness    Data Reviewed:   I have personally reviewed following labs and imaging studies:  Labs: Labs show the following:   Basic Metabolic Panel: Recent Labs  Lab 11/02/23 2136 11/03/23 0514 11/04/23 0525 11/05/23 0443 11/06/23 0501  NA 137 135 135 135 135  K 4.8 4.9 5.3* 5.4* 4.9  CL 105 106 108 105 103  CO2 23 21* 23 24 25   GLUCOSE 91 72 130* 157* 118*  BUN 39* 42* 48* 42* 30*  CREATININE 1.62* 1.90* 1.93* 1.55* 1.23  CALCIUM 9.2 8.6* 8.4* 8.8* 9.1   GFR Estimated Creatinine Clearance: 38.8 mL/min (by C-G formula based on SCr of 1.23 mg/dL). Liver Function Tests: Recent Labs  Lab 11/02/23 2136  AST 23  ALT 16  ALKPHOS 38  BILITOT 0.6  PROT 6.2*  ALBUMIN 3.9   No results for input(s): "LIPASE", "AMYLASE" in the last 168 hours. No results for input(s): "AMMONIA" in the last 168 hours. Coagulation profile Recent Labs  Lab 11/02/23 2136 11/03/23 0514  INR 1.2 1.4*    CBC: Recent Labs  Lab 11/02/23 2136 11/03/23 0514 11/04/23 0525 11/05/23 0443 11/06/23 0501  WBC 25.5* 21.3* 20.1* 16.8* 11.9*  NEUTROABS 22.4*  --   --  14.5* 9.9*  HGB 9.4* 8.5* 7.6* 7.9* 8.7*  HCT 29.1* 27.0* 23.3* 24.9* 27.2*  MCV 95.1 96.8 94.3 95.4 93.8  PLT 195 150 141* 141* 158   Cardiac Enzymes: Recent Labs  Lab  11/04/23 1742  CKTOTAL 208   BNP (last 3 results) No results for input(s): "PROBNP" in the last 8760 hours. CBG: Recent Labs  Lab 11/06/23 0743 11/06/23 1132 11/06/23 1621  GLUCAP 138* 102* 149*   D-Dimer: No results for input(s): "DDIMER" in the last 72 hours. Hgb A1c: No results for input(s): "HGBA1C" in the last 72 hours. Lipid Profile: No results for input(s): "CHOL", "HDL", "LDLCALC", "TRIG", "CHOLHDL", "LDLDIRECT" in the last 72 hours. Thyroid function studies: No results for input(s): "TSH", "T4TOTAL", "T3FREE", "THYROIDAB" in the last 72 hours.  Invalid input(s): "FREET3" Anemia work up: No results for input(s): "VITAMINB12", "FOLATE", "FERRITIN", "TIBC", "IRON", "RETICCTPCT" in the last 72 hours. Sepsis Labs: Recent Labs  Lab 11/02/23 2136 11/02/23 2334 11/03/23 0514 11/04/23 0525 11/05/23 0443 11/06/23 0501  PROCALCITON  --   --  4.72  --   --   --   WBC 25.5*  --  21.3* 20.1* 16.8* 11.9*  LATICACIDVEN 1.4 1.4  --   --   --   --     Microbiology Recent Results (from the past 240 hour(s))  Resp panel by RT-PCR (RSV, Flu A&B, Covid) Anterior Nasal Swab     Status: None   Collection Time: 11/02/23  9:37 PM   Specimen: Anterior Nasal Swab  Result Value Ref Range Status   SARS  Coronavirus 2 by RT PCR NEGATIVE NEGATIVE Final    Comment: (NOTE) SARS-CoV-2 target nucleic acids are NOT DETECTED.  The SARS-CoV-2 RNA is generally detectable in upper respiratory specimens during the acute phase of infection. The lowest concentration of SARS-CoV-2 viral copies this assay can detect is 138 copies/mL. A negative result does not preclude SARS-Cov-2 infection and should not be used as the sole basis for treatment or other patient management decisions. A negative result may occur with  improper specimen collection/handling, submission of specimen other than nasopharyngeal swab, presence of viral mutation(s) within the areas targeted by this assay, and inadequate  number of viral copies(<138 copies/mL). A negative result must be combined with clinical observations, patient history, and epidemiological information. The expected result is Negative.  Fact Sheet for Patients:  BloggerCourse.com  Fact Sheet for Healthcare Providers:  SeriousBroker.it  This test is no t yet approved or cleared by the [place] FDA and  has been authorized for detection and/or diagnosis of SARS-CoV-2 by FDA under an Emergency Use Authorization (EUA). This EUA will remain  in effect (meaning this test can be used) for the duration of the COVID-19 declaration under Section 564(b)(1) of the Act, 21 U.S.C.section 360bbb-3(b)(1), unless the authorization is terminated  or revoked sooner.       Influenza A by PCR NEGATIVE NEGATIVE Final   Influenza B by PCR NEGATIVE NEGATIVE Final    Comment: (NOTE) The Xpert Xpress SARS-CoV-2/FLU/RSV plus assay is intended as an aid in the diagnosis of influenza from Nasopharyngeal swab specimens and should not be used as a sole basis for treatment. Nasal washings and aspirates are unacceptable for Xpert Xpress SARS-CoV-2/FLU/RSV testing.  Fact Sheet for Patients: BloggerCourse.com  Fact Sheet for Healthcare Providers: SeriousBroker.it  This test is not yet approved or cleared by the [place] FDA and has been authorized for detection and/or diagnosis of SARS-CoV-2 by FDA under an Emergency Use Authorization (EUA). This EUA will remain in effect (meaning this test can be used) for the duration of the COVID-19 declaration under Section 564(b)(1) of the Act, 21 U.S.C. section 360bbb-3(b)(1), unless the authorization is terminated or revoked.     Resp Syncytial Virus by PCR NEGATIVE NEGATIVE Final    Comment: (NOTE) Fact Sheet for Patients: BloggerCourse.com  Fact Sheet for Healthcare  Providers: SeriousBroker.it  This test is not yet approved or cleared by the [place] FDA and has been authorized for detection and/or diagnosis of SARS-CoV-2 by FDA under an Emergency Use Authorization (EUA). This EUA will remain in effect (meaning this test can be used) for the duration of the COVID-19 declaration under Section 564(b)(1) of the Act, 21 U.S.C. section 360bbb-3(b)(1), unless the authorization is terminated or revoked.  Performed at [facility], [address] [place], [place] [address]   Blood Culture (routine x 2)     Status: None   Collection Time: 11/02/23  9:37 PM   Specimen: BLOOD  Result Value Ref Range Status   Specimen Description BLOOD BLOOD LEFT ARM  Final   Special Requests   Final    BOTTLES DRAWN AEROBIC AND ANAEROBIC Blood Culture adequate volume   Culture   Final    NO GROWTH 5 DAYS Performed at [facility], [address] [place], [place] [address]    Report Status 11/07/2023 FINAL  Final  Blood Culture (routine x 2)     Status: None   Collection Time: 11/02/23  9:37 PM   Specimen: BLOOD  Result Value Ref  Range Status   Specimen Description BLOOD BLOOD RIGHT ARM  Final   Special Requests   Final    BOTTLES DRAWN AEROBIC AND ANAEROBIC Blood Culture adequate volume   Culture   Final    NO GROWTH 5 DAYS Performed at [facility], [address] [place], [place] [address]    Report Status 11/07/2023 FINAL  Final    Procedures and diagnostic studies:  No results found.             LOS: 6 days   Altair Stanko  [facility]   Pager on [email or web address]. If 7PM-7AM, please contact night-coverage at [email or web address]     11/09/2023, 4:21 PM

## 2023-11-09 NOTE — TOC Progression Note (Signed)
Transition of Care [facility]) - Progression Note    Patient Details  Name: Francisco Gibbs MRN: [id number] Date of Birth: 16-Oct-1933  Transition of Care [facility]) CM/SW Contact  Colette Ribas, [place] Phone Number: 11/09/2023, 3:14 PM  Clinical Narrative:      CSW received message from Nurse advised patient is still refusing to DC to SNF. Patient will proceed with HH.     Expected Discharge Plan and Services                                               Social Determinants of Health (SDOH) Interventions SDOH Screenings   Food Insecurity: No Food Insecurity (11/05/2023)  Housing: Patient Unable To Answer (11/05/2023)  Transportation Needs: No Transportation Needs (11/05/2023)  Utilities: Patient Declined (11/05/2023)  Financial Resource Strain: Low Risk  (06/15/2023)   Received from [facility]  Tobacco Use: Medium Risk (11/06/2023)    Readmission Risk Interventions    11/03/2023    9:46 AM  Readmission Risk Prevention Plan  Transportation Screening Complete  PCP or Specialist Appt within 3-5 Days Complete  HRI or Home Care Consult Complete  Social Work Consult for Recovery Care Planning/Counseling Complete  Palliative Care Screening Not Applicable  Medication Review Oceanographer) Not Complete  Med Review Comments Patient will review medication with MD upon discharge

## 2023-11-09 NOTE — Progress Notes (Signed)
Report called to [employer], Charity fundraiser.

## 2023-11-09 NOTE — Plan of Care (Signed)

## 2023-11-09 NOTE — Progress Notes (Signed)
Patient received from 2a. Alert and verbal. Able to make needs and want known. No acute distress noted.   [name]

## 2023-11-09 NOTE — Plan of Care (Signed)
  Problem: Fluid Volume: Goal: Hemodynamic stability will improve Outcome: Progressing   Problem: Clinical Measurements: Goal: Diagnostic test results will improve Outcome: Progressing   Problem: Respiratory: Goal: Ability to maintain adequate ventilation will improve Outcome: Progressing

## 2023-11-10 DIAGNOSIS — A419 Sepsis, unspecified organism: Secondary | ICD-10-CM | POA: Diagnosis not present

## 2023-11-10 DIAGNOSIS — J189 Pneumonia, unspecified organism: Secondary | ICD-10-CM | POA: Diagnosis not present

## 2023-11-10 NOTE — Care Management Important Message (Signed)
Important Message  Patient Details  Name: Francisco Gibbs MRN: [id number] Date of Birth: 06-15-1933   Important Message Given:  Yes - Medicare IM     Francisco Gibbs 11/10/2023, 11:41 AM

## 2023-11-10 NOTE — Progress Notes (Signed)
IV's removed bilaterally without complications. Oxygen delivered from DME. Patient wheeled to the medical mall exit to be discharged to the care of niece, Francisco Gibbs, in stable condition.  [name]

## 2023-11-10 NOTE — TOC Transition Note (Signed)
Transition of Care [facility]) - CM/SW Discharge Note   Patient Details  Name: Francisco Gibbs MRN: [id number] Date of Birth: [birth year]  Transition of Care [facility]) CM/SW Contact:  Margarito Liner, LCSW Phone Number: 11/10/2023, 12:17 PM   Clinical Narrative:  Patient has orders to discharge home today. [facility] liaison is aware. Patient qualifies for home oxygen. He does not have an agency preference. Ordered through [employer]. No further concerns. CSW signing off.   Final next level of care: Home w Home Health Services Barriers to Discharge: Barriers Resolved   Patient Goals and CMS Choice   Choice offered to / list presented to : Patient  Discharge Placement                  Patient to be transferred to facility by: Niece   Patient and family notified of of transfer: 11/10/23  Discharge Plan and Services Additional resources added to the After Visit Summary for                  DME Arranged: Oxygen DME Agency: [employer] Date DME Agency Contacted: 11/10/23   Representative spoke with at DME Agency: Mickeal Needy HH Arranged: RN, PT, OT, Social Work [facility] Agency: [employer] [facility] Date [facility] Agency Contacted: 11/10/23   Representative spoke with at [facility] Agency: Becky Sax  Social Determinants of Health (SDOH) Interventions SDOH Screenings   Food Insecurity: No Food Insecurity (11/05/2023)  Housing: Patient Unable To Answer (11/05/2023)  Transportation Needs: No Transportation Needs (11/05/2023)  Utilities: Patient Declined (11/05/2023)  Financial Resource Strain: Low Risk  (06/15/2023)   Received from [facility]  Tobacco Use: Medium Risk (11/06/2023)     Readmission Risk Interventions    11/03/2023    9:46 AM  Readmission Risk Prevention Plan  Transportation Screening Complete  PCP or Specialist Appt within 3-5 Days Complete  HRI or Home Care Consult Complete  Social Work Consult for Recovery Care Planning/Counseling Complete   Palliative Care Screening Not Applicable  Medication Review Oceanographer) Not Complete  Med Review Comments Patient will review medication with MD upon discharge

## 2023-11-10 NOTE — Progress Notes (Signed)
Occupational Therapy Treatment Patient Details Name: Francisco Gibbs MRN: [id number] DOB: 1933/09/01 Today's Date: 11/10/2023   History of present illness Pt is an 89 y.o. male  with medical history significant for COPD, GERD, hypertension, dyslipidemia, CKD, type 2 diabetes mellitus, aortic stenosis, who presented to the hospital because of generalized weakness and fall. Workup for sepsis due to PNA.   OT comments  Chart reviewed to date, pt greeted in room, alert and oriented x4, reporting he feels nervous and hasn't slept well. Fair safety awareness throughout. Pt reports he wants to go home. Tx session targeted improving functional activity tolerance and safe ADL completion. Pt performs LB dressing with MIN A (briefs), toilet transfer with CGA on regular toilet height with RW, amb in room/hallway with CGA-supervision; intermittent verbal cues required throughout for safe RW use. Discussion and education provided re: safe Adl completion with use of DME after discharge. OT will continue to follow acutely.   Spo2 at rest on RA: 89% Spo2 amb on RA: 86% Spo2 amb on 1L via [place]: 91%   HR up to 128 with mobility in hallway.   After transition back to bed and at rest, [place] removed and pt at 90% on RA.       If plan is discharge home, recommend the following:  A little help with walking and/or transfers;A little help with bathing/dressing/bathroom;Assistance with cooking/housework;Assist for transportation;Help with stairs or ramp for entrance   Equipment Recommendations  None recommended by OT    Recommendations for Other Services      Precautions / Restrictions Precautions Precautions: Fall Restrictions Weight Bearing Restrictions: No       Mobility Bed Mobility Overal bed mobility: Modified Independent                  Transfers Overall transfer level: Needs assistance Equipment used: Rolling walker (2 wheels) Transfers: Sit to/from Stand Sit to Stand: Supervision                  Balance Overall balance assessment: Needs assistance Sitting-balance support: Feet supported Sitting balance-Leahy Scale: Good     Standing balance support: Bilateral upper extremity supported, During functional activity Standing balance-Leahy Scale: Fair                             ADL either performed or assessed with clinical judgement   ADL Overall ADL's : Needs assistance/impaired     Grooming: Wash/dry hands;Standing;Supervision/safety Grooming Details (indicate cue type and reason): vcs for safe RW technique         Upper Body Dressing : Supervision/safety;Sitting   Lower Body Dressing: Minimal assistance;Sit to/from stand Lower Body Dressing Details (indicate cue type and reason): set up for shoes, MIN A for depends Toilet Transfer: Supervision/safety;Rolling walker (2 wheels) Toilet Transfer Details (indicate cue type and reason): intermittent vcs for safe technique Toileting- Clothing Manipulation and Hygiene: Supervision/safety;Sitting/lateral lean Toileting - Clothing Manipulation Details (indicate cue type and reason): after contient BM     Functional mobility during ADLs: Supervision/safety;Contact guard assist;Rolling walker (2 wheels) (approx 160' in hallway, increased assist/intermittent vcs for safety with o2 tubing)      Extremity/Trunk Assessment              Vision       Perception     Praxis      Cognition Arousal: Alert Behavior During Therapy: WFL for tasks assessed/performed, Impulsive Overall Cognitive Status: No family/caregiver present to  determine baseline cognitive functioning Area of Impairment: Safety/judgement, Awareness                   Current Attention Level: Selective     Safety/Judgement: Decreased awareness of safety     General Comments: Pt reports he feels nervous becasue he does not want to be "stuck in a nursing home", fair safety awareness throughout        Exercises Other  Exercises Other Exercises: edu re: role of OT, role of rehab, discharge recommednations, safe ADL completion, DME use for safe ADL completion    Shoulder Instructions       General Comments      Pertinent Vitals/ Pain       Pain Assessment Pain Assessment: No/denies pain  Home Living                                          Prior Functioning/Environment              Frequency           Progress Toward Goals  OT Goals(current goals can now be found in the care plan section)  Progress towards OT goals: Progressing toward goals  Acute Rehab OT Goals Time For Goal Achievement: 11/19/23  Plan      Co-evaluation                 AM-PAC OT "6 Clicks" Daily Activity     Outcome Measure   Help from another person eating meals?: None Help from another person taking care of personal grooming?: None Help from another person toileting, which includes using toliet, bedpan, or urinal?: A Little Help from another person bathing (including washing, rinsing, drying)?: A Little Help from another person to put on and taking off regular upper body clothing?: None Help from another person to put on and taking off regular lower body clothing?: A Little 6 Click Score: 21    End of Session Equipment Utilized During Treatment: Rolling walker (2 wheels);Oxygen  OT Visit Diagnosis: Other abnormalities of gait and mobility (R26.89)   Activity Tolerance Patient tolerated treatment well   Patient Left in bed;with call bell/phone within reach;with bed alarm set   Nurse Communication Mobility status (vitals)        Time: 7564-3329 OT Time Calculation (min): 34 min  Charges: OT General Charges $OT Visit: 1 Visit OT Treatments $Self Care/Home Management : 8-22 mins $Therapeutic Activity: 8-22 mins  Oleta Mouse, OTD OTR/L  11/10/23, 11:19 AM

## 2023-11-10 NOTE — Progress Notes (Signed)
Patient Saturations on Room Air at Rest = 89%  Patient Saturations on [employer] while Ambulating = 86%  Patient Saturations on 1 Liters of oxygen while Ambulating = 90%  Madie Reno, RN

## 2023-11-10 NOTE — Discharge Summary (Signed)
Physician Discharge Summary   Patient: Francisco Gibbs MRN: [id number] DOB: 10/01/1933  Admit date:     11/02/2023  Discharge date: 11/10/23  Discharge Physician: Lurene Shadow   PCP: Abram Sander, MD   Recommendations at discharge:    Follow up with PCP in 1 week  Discharge Diagnoses: Principal Problem:   Sepsis due to pneumonia [facility]) Active Problems:   Acute respiratory failure with hypoxia (HCC)   Dyslipidemia   Type 2 diabetes mellitus with peripheral neuropathy (HCC)   Essential hypertension   Chronic obstructive pulmonary disease (HCC)   Depression   BPH (benign prostatic hyperplasia)  Resolved Problems:   * No resolved hospital problems. *  Hospital Course:  Francisco Gibbs is a 89 y.o. male  with medical history significant for COPD, GERD, hypertension, dyslipidemia, type 2 diabetes mellitus, aortic stenosis, who presented to the hospital because of generalized weakness and fall.  He lost his balance and fell backward striking his head on the wall.  He was unable to stand up after the fall.  He laid on the ground for about 2 hours until his daughter came home and found him on the floor.  He also complained of cough productive of whitish sputum, shortness of breath and wheezing.     Assessment and Plan:   Sepsis secondary to pneumonia: Completed 5 days of IV  antibiotics.  Incentive spirometry as needed.  No growth on blood cultures. Leukocytosis has improved.     Acute on chronic hypoxemic respiratory failure: He will be discharged on 1 L/min oxygen to be used with activity.     CKD stage 3b: Creatinine is stable Hyperkalemia: Improved     COPD with acute exacerbation: Completed prednisone.  Continue bronchodilators     General weakness: PT recommend discharge to SNF.  However, he refused to go to SNF. He understands that he's at increased risk for falls.     Acute on chronic anemia: Hemoglobin up from 7.6-7.9-8.7.  No indication for blood transfusion at this  time..      Comorbidities include hypertension, hyperlipidemia, type 2 diabetes mellitus, BPH, mild to moderate aortic stenosis, depression   His condition has improved and he's deemed stable for discharge today.             Consultants: None Procedures performed: None  Disposition: Home health Diet recommendation:  Discharge Diet Orders (From admission, onward)     Start     Ordered   11/10/23 0000  Diet - low sodium heart healthy        11/10/23 1051   11/10/23 0000  Diet Carb Modified        11/10/23 1051           Cardiac diet DISCHARGE MEDICATION: Allergies as of 11/10/2023       Reactions   Guaifenesin Shortness Of Breath   Other reaction(s): Other (See Comments)   Penicillin G Rash, Shortness Of Breath   Has patient had a PCN reaction causing immediate rash, facial/tongue/throat swelling, SOB or lightheadedness with hypotension: Yes Has patient had a PCN reaction causing severe rash involving mucus membranes or skin necrosis: No Has patient had a PCN reaction that required hospitalization: No Has patient had a PCN reaction occurring within the last 10 years: No If all of the above answers are "NO", then may proceed with Cephalosporin use. Has patient had a PCN reaction causing immediate rash, facial/tongue/throat swelling, SOB or lightheadedness with hypotension: Yes Has patient had a PCN reaction  causing severe rash involving mucus membranes or skin necrosis: No Has patient had a PCN reaction that required hospitalization: No Has patient had a PCN reaction occurring within the last 10 years: No If all of the above answers are "NO", then may proceed with Cephalosporin use. Has patient had a PCN reaction causing immediate rash, facial/tongue/throat swelling, SOB or lightheadedness with hypotension: Yes Has patient had a PCN reaction causing severe rash involving mucus membranes or skin necrosis: No Has patient had a PCN reaction that required hospitalization:  No Has patient had a PCN reaction occurring within the last 10 years: No If all of the above answers are "NO", then may proceed with Cephalosporin use. Has patient had a PCN reaction causing immediate rash, facial/tongue/throat swelling, SOB or lightheadedness with hypotension: Yes Has patient had a PCN reaction causing severe rash involving mucus membranes or skin necrosis: No Has patient had a PCN reaction that required hospitalization: No Has patient had a PCN reaction occurring within the last 10 years: NoIf all of the above answers are "NO", then may proceed with Cephalosporin use. Has patient had a PCN reaction causing immediate rash, facial/tongue/throat swelling, SOB or lightheadedness with hypotension: Yes Has patient had a PCN reaction causing se... (TRUNCATED)   Sulfa Antibiotics Shortness Of Breath        Medication List     STOP taking these medications    atorvastatin 80 MG tablet Commonly known as: LIPITOR   furosemide 20 MG tablet Commonly known as: LASIX   omeprazole 40 MG capsule Commonly known as: PRILOSEC   sitaGLIPtin 50 MG tablet Commonly known as: JANUVIA   Voltaren 1 % Gel Generic drug: diclofenac Sodium       TAKE these medications    albuterol 108 (90 Base) MCG/ACT inhaler Commonly known as: VENTOLIN HFA Inhale 2 puffs into the lungs every 4 (four) hours as needed for wheezing or shortness of breath.   amLODipine 10 MG tablet Commonly known as: NORVASC Take 10 mg by mouth daily.   aspirin 81 MG chewable tablet Chew 81 mg by mouth daily.   cetirizine 10 MG tablet Commonly known as: ZYRTEC Take 10 mg by mouth every evening.   famotidine 20 MG tablet Commonly known as: PEPCID Take 20 mg by mouth 2 (two) times daily.   FeroSul 325 (65 Fe) MG tablet Generic drug: ferrous sulfate Take 325 mg by mouth 2 (two) times daily.   fluticasone 50 MCG/ACT nasal spray Commonly known as: FLONASE Place 2 sprays into the nose every evening.    gabapentin 300 MG capsule Commonly known as: NEURONTIN Take 300 mg by mouth 3 (three) times daily.   hydrALAZINE 50 MG tablet Commonly known as: APRESOLINE Take 50 mg by mouth 4 (four) times daily. What changed: Another medication with the same name was removed. Continue taking this medication, and follow the directions you see here.   ipratropium-albuterol 0.5-2.5 (3) MG/3ML Soln Commonly known as: DUONEB Take 3 mLs by nebulization every 6 (six) hours as needed. What changed: when to take this   lisinopril 20 MG tablet Commonly known as: ZESTRIL Take 20 mg by mouth daily. What changed: Another medication with the same name was removed. Continue taking this medication, and follow the directions you see here.   magnesium oxide 400 (241.3 Mg) MG tablet Commonly known as: MAG-OX Take 1 tablet (400 mg total) by mouth daily.   metFORMIN 500 MG tablet Commonly known as: GLUCOPHAGE Take 1,000 mg by mouth 2 (two) times daily.  multivitamin with minerals tablet Take 1 tablet by mouth daily.   sertraline 25 MG tablet Commonly known as: ZOLOFT Take 50 mg by mouth daily.   SPS (Sodium Polystyrene Sulf) 15 GM/60ML suspension Generic drug: sodium polystyrene Take 15 g by mouth. Twice per week   tamsulosin 0.4 MG Caps capsule Commonly known as: FLOMAX Take 1 capsule (0.4 mg total) by mouth daily after supper.   Trelegy Ellipta 100-62.5-25 MCG/INH Aepb Generic drug: Fluticasone-Umeclidin-Vilant Inhale 1 puff into the lungs daily.               Durable Medical Equipment  (From admission, onward)           Start     Ordered   11/10/23 1050  DME Oxygen  Once       Comments: Wear oxygen with activity, exertion or ambulation  Question Answer Comment  Length of Need Lifetime   Mode or (Route) Nasal cannula   Liters per Minute 1   Frequency Continuous (stationary and portable oxygen unit needed)   Oxygen conserving device Yes   Oxygen delivery system Gas       11/10/23 1051            Discharge Exam: Filed Weights   11/02/23 2126 11/07/23 0330  Weight: 79.4 kg 71.8 kg   GEN: NAD SKIN: Warm and dry EYES: No pallor or icterus ENT: MMM, hard of hearing CV: RRR PULM: CTA B ABD: soft, ND, NT, +BS CNS: AAO x 3, non focal EXT: No edema or tenderness   Condition at discharge: good  The results of significant diagnostics from this hospitalization (including imaging, microbiology, ancillary and laboratory) are listed below for reference.   Imaging Studies: CT Head Wo Contrast  Result Date: 11/03/2023 CLINICAL DATA:  FALL EXAM: CT HEAD WITHOUT CONTRAST CT CERVICAL SPINE WITHOUT CONTRAST TECHNIQUE: Multidetector CT imaging of the head and cervical spine was performed following the standard protocol without intravenous contrast. Multiplanar CT image reconstructions of the cervical spine were also generated. RADIATION DOSE REDUCTION: This exam was performed according to the departmental dose-optimization program which includes automated exposure control, adjustment of the mA and/or kV according to patient size and/or use of iterative reconstruction technique. COMPARISON:  None Available. FINDINGS: CT HEAD FINDINGS Brain: There is no mass, hemorrhage or extra-axial collection. The size and configuration of the ventricles and extra-axial CSF spaces are normal. The brain parenchyma is normal, without evidence of acute or chronic infarction. Vascular: No abnormal hyperdensity of the major intracranial arteries or dural venous sinuses. No intracranial atherosclerosis. Skull: The visualized skull base, calvarium and extracranial soft tissues are normal. Sinuses/Orbits: No fluid levels or advanced mucosal thickening of the visualized paranasal sinuses. No mastoid or middle ear effusion. The orbits are normal. CT CERVICAL SPINE FINDINGS Alignment: No acute subluxation. Facets are aligned. Occipital condyles are normally positioned. Skull base and vertebrae: No  acute fracture. Soft tissues and spinal canal: No prevertebral fluid or swelling. No visible canal hematoma. Disc levels: No advanced spinal canal or neural foraminal stenosis. Upper chest: No pneumothorax, pulmonary nodule or pleural effusion. Emphysema. Other: Left carotid stent IMPRESSION: 1. No acute intracranial abnormality. 2. No acute fracture or subluxation of the cervical spine. Emphysema (ICD10-J43.9). Electronically Signed   By: Deatra Robinson M.D.   On: 11/03/2023 00:36   CT Cervical Spine Wo Contrast  Result Date: 11/03/2023 CLINICAL DATA:  FALL EXAM: CT HEAD WITHOUT CONTRAST CT CERVICAL SPINE WITHOUT CONTRAST TECHNIQUE: Multidetector CT imaging of the head and  cervical spine was performed following the standard protocol without intravenous contrast. Multiplanar CT image reconstructions of the cervical spine were also generated. RADIATION DOSE REDUCTION: This exam was performed according to the departmental dose-optimization program which includes automated exposure control, adjustment of the mA and/or kV according to patient size and/or use of iterative reconstruction technique. COMPARISON:  None Available. FINDINGS: CT HEAD FINDINGS Brain: There is no mass, hemorrhage or extra-axial collection. The size and configuration of the ventricles and extra-axial CSF spaces are normal. The brain parenchyma is normal, without evidence of acute or chronic infarction. Vascular: No abnormal hyperdensity of the major intracranial arteries or dural venous sinuses. No intracranial atherosclerosis. Skull: The visualized skull base, calvarium and extracranial soft tissues are normal. Sinuses/Orbits: No fluid levels or advanced mucosal thickening of the visualized paranasal sinuses. No mastoid or middle ear effusion. The orbits are normal. CT CERVICAL SPINE FINDINGS Alignment: No acute subluxation. Facets are aligned. Occipital condyles are normally positioned. Skull base and vertebrae: No acute fracture. Soft  tissues and spinal canal: No prevertebral fluid or swelling. No visible canal hematoma. Disc levels: No advanced spinal canal or neural foraminal stenosis. Upper chest: No pneumothorax, pulmonary nodule or pleural effusion. Emphysema. Other: Left carotid stent IMPRESSION: 1. No acute intracranial abnormality. 2. No acute fracture or subluxation of the cervical spine. Emphysema (ICD10-J43.9). Electronically Signed   By: Deatra Robinson M.D.   On: 11/03/2023 00:36   DG Chest Port 1 View  Result Date: 11/03/2023 CLINICAL DATA:  Possible sepsis EXAM: PORTABLE CHEST 1 VIEW COMPARISON:  02/26/2021 FINDINGS: Heterogeneous right upper lobe airspace opacity. Streaky left lung base opacity. Normal cardiac size with aortic atherosclerosis IMPRESSION: Heterogeneous right upper lobe airspace opacity suspicious for pneumonia. Radiographic follow-up to resolution is recommended. Electronically Signed   By: Jasmine Pang M.D.   On: 11/03/2023 00:10    Microbiology: Results for orders placed or performed during the hospital encounter of 11/02/23  Resp panel by RT-PCR (RSV, Flu A&B, Covid) Anterior Nasal Swab     Status: None   Collection Time: 11/02/23  9:37 PM   Specimen: Anterior Nasal Swab  Result Value Ref Range Status   SARS Coronavirus 2 by RT PCR NEGATIVE NEGATIVE Final    Comment: (NOTE) SARS-CoV-2 target nucleic acids are NOT DETECTED.  The SARS-CoV-2 RNA is generally detectable in upper respiratory specimens during the acute phase of infection. The lowest concentration of SARS-CoV-2 viral copies this assay can detect is 138 copies/mL. A negative result does not preclude SARS-Cov-2 infection and should not be used as the sole basis for treatment or other patient management decisions. A negative result may occur with  improper specimen collection/handling, submission of specimen other than nasopharyngeal swab, presence of viral mutation(s) within the areas targeted by this assay, and inadequate number  of viral copies(<138 copies/mL). A negative result must be combined with clinical observations, patient history, and epidemiological information. The expected result is Negative.  Fact Sheet for Patients:  BloggerCourse.com  Fact Sheet for Healthcare Providers:  SeriousBroker.it  This test is no t yet approved or cleared by the [place] FDA and  has been authorized for detection and/or diagnosis of SARS-CoV-2 by FDA under an Emergency Use Authorization (EUA). This EUA will remain  in effect (meaning this test can be used) for the duration of the COVID-19 declaration under Section 564(b)(1) of the Act, 21 U.S.C.section 360bbb-3(b)(1), unless the authorization is terminated  or revoked sooner.       Influenza A by PCR NEGATIVE  NEGATIVE Final   Influenza B by PCR NEGATIVE NEGATIVE Final    Comment: (NOTE) The Xpert Xpress SARS-CoV-2/FLU/RSV plus assay is intended as an aid in the diagnosis of influenza from Nasopharyngeal swab specimens and should not be used as a sole basis for treatment. Nasal washings and aspirates are unacceptable for Xpert Xpress SARS-CoV-2/FLU/RSV testing.  Fact Sheet for Patients: BloggerCourse.com  Fact Sheet for Healthcare Providers: SeriousBroker.it  This test is not yet approved or cleared by the [place] FDA and has been authorized for detection and/or diagnosis of SARS-CoV-2 by FDA under an Emergency Use Authorization (EUA). This EUA will remain in effect (meaning this test can be used) for the duration of the COVID-19 declaration under Section 564(b)(1) of the Act, 21 U.S.C. section 360bbb-3(b)(1), unless the authorization is terminated or revoked.     Resp Syncytial Virus by PCR NEGATIVE NEGATIVE Final    Comment: (NOTE) Fact Sheet for Patients: BloggerCourse.com  Fact Sheet for Healthcare  Providers: SeriousBroker.it  This test is not yet approved or cleared by the [place] FDA and has been authorized for detection and/or diagnosis of SARS-CoV-2 by FDA under an Emergency Use Authorization (EUA). This EUA will remain in effect (meaning this test can be used) for the duration of the COVID-19 declaration under Section 564(b)(1) of the Act, 21 U.S.C. section 360bbb-3(b)(1), unless the authorization is terminated or revoked.  Performed at [facility], [address] [place], [place] [address]   Blood Culture (routine x 2)     Status: None   Collection Time: 11/02/23  9:37 PM   Specimen: BLOOD  Result Value Ref Range Status   Specimen Description BLOOD BLOOD LEFT ARM  Final   Special Requests   Final    BOTTLES DRAWN AEROBIC AND ANAEROBIC Blood Culture adequate volume   Culture   Final    NO GROWTH 5 DAYS Performed at [facility], [address] [place], [place] [address]    Report Status 11/07/2023 FINAL  Final  Blood Culture (routine x 2)     Status: None   Collection Time: 11/02/23  9:37 PM   Specimen: BLOOD  Result Value Ref Range Status   Specimen Description BLOOD BLOOD RIGHT ARM  Final   Special Requests   Final    BOTTLES DRAWN AEROBIC AND ANAEROBIC Blood Culture adequate volume   Culture   Final    NO GROWTH 5 DAYS Performed at [facility], [address] [place], [place] [address]    Report Status 11/07/2023 FINAL  Final    Labs: CBC: Recent Labs  Lab 11/04/23 0525 11/05/23 0443 11/06/23 0501  WBC 20.1* 16.8* 11.9*  NEUTROABS  --  14.5* 9.9*  HGB 7.6* 7.9* 8.7*  HCT 23.3* 24.9* 27.2*  MCV 94.3 95.4 93.8  PLT 141* 141* 158   Basic Metabolic Panel: Recent Labs  Lab 11/04/23 0525 11/05/23 0443 11/06/23 0501  NA 135 135 135  K 5.3* 5.4* 4.9  CL 108 105 103  CO2 23 24 25   GLUCOSE 130* 157* 118*  BUN 48* 42* 30*  CREATININE 1.93* 1.55* 1.23  CALCIUM 8.4* 8.8* 9.1   Liver  Function Tests: No results for input(s): "AST", "ALT", "ALKPHOS", "BILITOT", "PROT", "ALBUMIN" in the last 168 hours. CBG: Recent Labs  Lab 11/06/23 0743 11/06/23 1132 11/06/23 1621  GLUCAP 138* 102* 149*    Discharge time spent: greater than 30 minutes.  Signed: Lurene Shadow, MD [facility] 11/10/2023

## 2023-11-10 NOTE — Plan of Care (Signed)

## 2023-11-10 NOTE — Plan of Care (Signed)

## 2023-12-22 ENCOUNTER — Other Ambulatory Visit: Payer: Self-pay | Admitting: Urology

## 2023-12-22 DIAGNOSIS — R399 Unspecified symptoms and signs involving the genitourinary system: Secondary | ICD-10-CM

## 2024-01-19 ENCOUNTER — Other Ambulatory Visit: Payer: Self-pay | Admitting: Urology

## 2024-01-19 DIAGNOSIS — R399 Unspecified symptoms and signs involving the genitourinary system: Secondary | ICD-10-CM

## 2024-09-05 ENCOUNTER — Emergency Department

## 2024-09-05 ENCOUNTER — Other Ambulatory Visit: Payer: Self-pay

## 2024-09-05 ENCOUNTER — Observation Stay
Admission: EM | Admit: 2024-09-05 | Discharge: 2024-09-06 | Disposition: A | Discharge status: Home / Self Care | Attending: Internal Medicine | Admitting: Internal Medicine

## 2024-09-05 DIAGNOSIS — E119 Type 2 diabetes mellitus without complications: Secondary | ICD-10-CM | POA: Insufficient documentation

## 2024-09-05 DIAGNOSIS — I10 Essential (primary) hypertension: Secondary | ICD-10-CM | POA: Insufficient documentation

## 2024-09-05 DIAGNOSIS — Z7982 Long term (current) use of aspirin: Secondary | ICD-10-CM | POA: Diagnosis not present

## 2024-09-05 DIAGNOSIS — Z7984 Long term (current) use of oral hypoglycemic drugs: Secondary | ICD-10-CM | POA: Diagnosis not present

## 2024-09-05 DIAGNOSIS — C259 Malignant neoplasm of pancreas, unspecified: Principal | ICD-10-CM | POA: Diagnosis present

## 2024-09-05 DIAGNOSIS — C253 Malignant neoplasm of pancreatic duct: Secondary | ICD-10-CM | POA: Diagnosis not present

## 2024-09-05 DIAGNOSIS — Z7189 Other specified counseling: Secondary | ICD-10-CM

## 2024-09-05 DIAGNOSIS — Z515 Encounter for palliative care: Secondary | ICD-10-CM | POA: Diagnosis not present

## 2024-09-05 DIAGNOSIS — N401 Enlarged prostate with lower urinary tract symptoms: Secondary | ICD-10-CM | POA: Insufficient documentation

## 2024-09-05 DIAGNOSIS — R1085 Abdominal pain of multiple sites: Principal | ICD-10-CM

## 2024-09-05 DIAGNOSIS — K8689 Other specified diseases of pancreas: Secondary | ICD-10-CM | POA: Diagnosis not present

## 2024-09-05 DIAGNOSIS — K869 Disease of pancreas, unspecified: Secondary | ICD-10-CM | POA: Insufficient documentation

## 2024-09-05 DIAGNOSIS — J449 Chronic obstructive pulmonary disease, unspecified: Secondary | ICD-10-CM | POA: Diagnosis not present

## 2024-09-05 DIAGNOSIS — Z79899 Other long term (current) drug therapy: Secondary | ICD-10-CM | POA: Insufficient documentation

## 2024-09-05 DIAGNOSIS — R32 Unspecified urinary incontinence: Secondary | ICD-10-CM | POA: Diagnosis not present

## 2024-09-05 DIAGNOSIS — R109 Unspecified abdominal pain: Secondary | ICD-10-CM | POA: Diagnosis present

## 2024-09-05 LAB — COMPREHENSIVE METABOLIC PANEL WITH GFR
ALT: 15 U/L (ref 0–44)
AST: 23 U/L (ref 15–41)
Albumin: 4.3 g/dL (ref 3.5–5.0)
Alkaline Phosphatase: 38 U/L (ref 38–126)
Anion gap: 14 (ref 5–15)
BUN: 33 mg/dL — ABNORMAL HIGH (ref 8–23)
CO2: 19 mmol/L — ABNORMAL LOW (ref 22–32)
Calcium: 9.8 mg/dL (ref 8.9–10.3)
Chloride: 103 mmol/L (ref 98–111)
Creatinine, Ser: 1.35 mg/dL — ABNORMAL HIGH (ref 0.61–1.24)
GFR, Estimated: 50 mL/min — ABNORMAL LOW (ref >60)
Glucose, Bld: 162 mg/dL — ABNORMAL HIGH (ref 70–99)
Potassium: 5.1 mmol/L (ref 3.5–5.1)
Sodium: 136 mmol/L (ref 135–145)
Total Bilirubin: 0.5 mg/dL (ref 0.0–1.2)
Total Protein: 7.5 g/dL (ref 6.5–8.1)

## 2024-09-05 LAB — URINALYSIS, W/ REFLEX TO CULTURE (INFECTION SUSPECTED)
Bacteria, UA: NONE SEEN
Bilirubin Urine: NEGATIVE
Glucose, UA: NEGATIVE mg/dL
Hgb urine dipstick: NEGATIVE
Ketones, ur: NEGATIVE mg/dL
Leukocytes,Ua: NEGATIVE
Nitrite: NEGATIVE
Protein, ur: NEGATIVE mg/dL
RBC / HPF: 0 RBC/hpf (ref 0–5)
Specific Gravity, Urine: 1.011 (ref 1.005–1.030)
Squamous Epithelial / HPF: 0 /HPF (ref 0–5)
WBC, UA: 0 WBC/hpf (ref 0–5)
pH: 6 (ref 5.0–8.0)

## 2024-09-05 LAB — GLUCOSE, CAPILLARY
Glucose-Capillary: 128 mg/dL — ABNORMAL HIGH (ref 70–99)
Glucose-Capillary: 232 mg/dL — ABNORMAL HIGH (ref 70–99)

## 2024-09-05 LAB — HEMOGLOBIN A1C
Hgb A1c MFr Bld: 6.5 % — ABNORMAL HIGH (ref 4.8–5.6)
Mean Plasma Glucose: 139.85 mg/dL

## 2024-09-05 LAB — CBC WITH DIFFERENTIAL/PLATELET
Abs Immature Granulocytes: 0.02 K/uL (ref 0.00–0.07)
Basophils Absolute: 0 K/uL (ref 0.0–0.1)
Basophils Relative: 1 %
Eosinophils Absolute: 0 K/uL (ref 0.0–0.5)
Eosinophils Relative: 1 %
HCT: 36.5 % — ABNORMAL LOW (ref 39.0–52.0)
Hemoglobin: 11.4 g/dL — ABNORMAL LOW (ref 13.0–17.0)
Immature Granulocytes: 1 %
Lymphocytes Relative: 22 %
Lymphs Abs: 0.9 K/uL (ref 0.7–4.0)
MCH: 29.2 pg (ref 26.0–34.0)
MCHC: 31.2 g/dL (ref 30.0–36.0)
MCV: 93.6 fL (ref 80.0–100.0)
Monocytes Absolute: 0.3 K/uL (ref 0.1–1.0)
Monocytes Relative: 7 %
Neutro Abs: 2.8 K/uL (ref 1.7–7.7)
Neutrophils Relative %: 68 %
Platelets: 181 K/uL (ref 150–400)
RBC: 3.9 MIL/uL — ABNORMAL LOW (ref 4.22–5.81)
RDW: 13.4 % (ref 11.5–15.5)
WBC: 4.1 K/uL (ref 4.0–10.5)
nRBC: 0 % (ref 0.0–0.2)

## 2024-09-05 LAB — LIPASE, BLOOD: Lipase: 63 U/L — ABNORMAL HIGH (ref 11–51)

## 2024-09-05 LAB — TROPONIN I (HIGH SENSITIVITY): Troponin I (High Sensitivity): 13 ng/L (ref <18)

## 2024-09-05 MED ORDER — INSULIN ASPART 100 UNIT/ML IJ SOLN
0.0000 [IU] | Freq: Three times a day (TID) | INTRAMUSCULAR | Status: DC
  Administered 2024-09-05: 1 [IU] via SUBCUTANEOUS

## 2024-09-05 MED ORDER — ENSURE PLUS HIGH PROTEIN PO LIQD
237.0000 mL | Freq: Two times a day (BID) | ORAL | Status: DC
  Administered 2024-09-06: 237 mL via ORAL

## 2024-09-05 MED ORDER — IOHEXOL 300 MG/ML  SOLN
100.0000 mL | Freq: Once | INTRAMUSCULAR | Status: AC | PRN
  Administered 2024-09-05: 100 mL via INTRAVENOUS

## 2024-09-05 MED ORDER — DOCUSATE SODIUM 100 MG PO CAPS: 100.0000 mg | ORAL_CAPSULE | Freq: Two times a day (BID) | ORAL | Status: DC | PRN

## 2024-09-05 MED ORDER — SODIUM CHLORIDE 0.9 % IV BOLUS
1000.0000 mL | Freq: Once | INTRAVENOUS | Status: AC
  Administered 2024-09-05: 1000 mL via INTRAVENOUS

## 2024-09-05 MED ORDER — LISINOPRIL 20 MG PO TABS
20.0000 mg | ORAL_TABLET | Freq: Every day | ORAL | Status: DC
  Administered 2024-09-06: 20 mg via ORAL
  Filled 2024-09-05: qty 1

## 2024-09-05 MED ORDER — ASPIRIN 81 MG PO CHEW
81.0000 mg | CHEWABLE_TABLET | Freq: Every day | ORAL | Status: DC
  Administered 2024-09-06: 81 mg via ORAL
  Filled 2024-09-05: qty 1

## 2024-09-05 MED ORDER — ACETAMINOPHEN 650 MG RE SUPP: 650.0000 mg | Freq: Four times a day (QID) | RECTAL | Status: DC | PRN

## 2024-09-05 MED ORDER — OXYCODONE HCL 5 MG PO TABS: 5.0000 mg | ORAL_TABLET | Freq: Four times a day (QID) | ORAL | Status: DC | PRN

## 2024-09-05 MED ORDER — ONDANSETRON HCL 4 MG PO TABS: 4.0000 mg | ORAL_TABLET | Freq: Four times a day (QID) | ORAL | Status: DC | PRN

## 2024-09-05 MED ORDER — TAMSULOSIN HCL 0.4 MG PO CAPS
0.4000 mg | ORAL_CAPSULE | Freq: Every day | ORAL | Status: DC
  Administered 2024-09-05 – 2024-09-06 (×2): 0.4 mg via ORAL
  Filled 2024-09-05 (×2): qty 1

## 2024-09-05 MED ORDER — SERTRALINE HCL 50 MG PO TABS
50.0000 mg | ORAL_TABLET | Freq: Every day | ORAL | Status: DC
  Administered 2024-09-06: 50 mg via ORAL
  Filled 2024-09-05: qty 1

## 2024-09-05 MED ORDER — LORATADINE 10 MG PO TABS
10.0000 mg | ORAL_TABLET | Freq: Every day | ORAL | Status: DC
  Administered 2024-09-06: 10 mg via ORAL
  Filled 2024-09-05: qty 1

## 2024-09-05 MED ORDER — ENOXAPARIN SODIUM 40 MG/0.4ML IJ SOSY: 40.0000 mg | PREFILLED_SYRINGE | INTRAMUSCULAR | Status: DC

## 2024-09-05 MED ORDER — GABAPENTIN 300 MG PO CAPS
300.0000 mg | ORAL_CAPSULE | Freq: Three times a day (TID) | ORAL | Status: DC
  Administered 2024-09-05 – 2024-09-06 (×4): 300 mg via ORAL
  Filled 2024-09-05 (×4): qty 1

## 2024-09-05 MED ORDER — FINASTERIDE 5 MG PO TABS
5.0000 mg | ORAL_TABLET | Freq: Every day | ORAL | Status: DC
  Administered 2024-09-06: 5 mg via ORAL
  Filled 2024-09-05: qty 1

## 2024-09-05 MED ORDER — HYDRALAZINE HCL 50 MG PO TABS
100.0000 mg | ORAL_TABLET | Freq: Four times a day (QID) | ORAL | Status: DC
Start: 2024-09-05 — End: 2024-09-06
  Administered 2024-09-05 – 2024-09-06 (×4): 100 mg via ORAL
  Filled 2024-09-05 (×5): qty 2

## 2024-09-05 MED ORDER — HYDROMORPHONE HCL 1 MG/ML IJ SOLN: 0.5000 mg | INTRAMUSCULAR | Status: DC | PRN

## 2024-09-05 MED ORDER — POLYETHYLENE GLYCOL 3350 17 G PO PACK: 17.0000 g | PACK | Freq: Every day | ORAL | Status: DC | PRN

## 2024-09-05 MED ORDER — AMLODIPINE BESYLATE 10 MG PO TABS
10.0000 mg | ORAL_TABLET | Freq: Every day | ORAL | Status: DC
Start: 2024-09-05 — End: 2024-09-06
  Administered 2024-09-06: 10 mg via ORAL
  Filled 2024-09-05: qty 1

## 2024-09-05 MED ORDER — ONDANSETRON HCL 4 MG/2ML IJ SOLN: 4.0000 mg | Freq: Four times a day (QID) | INTRAMUSCULAR | Status: DC | PRN

## 2024-09-05 MED ORDER — ALBUTEROL SULFATE (2.5 MG/3ML) 0.083% IN NEBU: 2.5000 mg | INHALATION_SOLUTION | RESPIRATORY_TRACT | Status: DC | PRN

## 2024-09-05 MED ORDER — BUDESON-GLYCOPYRROL-FORMOTEROL 160-9-4.8 MCG/ACT IN AERO
2.0000 | INHALATION_SPRAY | Freq: Two times a day (BID) | RESPIRATORY_TRACT | Status: DC
  Administered 2024-09-05 – 2024-09-06 (×2): 2 via RESPIRATORY_TRACT
  Filled 2024-09-05 (×2): qty 5.9

## 2024-09-05 MED ORDER — ACETAMINOPHEN 325 MG PO TABS: 650.0000 mg | ORAL_TABLET | Freq: Four times a day (QID) | ORAL | Status: DC | PRN

## 2024-09-05 MED ORDER — FAMOTIDINE 20 MG PO TABS
20.0000 mg | ORAL_TABLET | Freq: Two times a day (BID) | ORAL | Status: DC
  Administered 2024-09-05 – 2024-09-06 (×2): 20 mg via ORAL
  Filled 2024-09-05 (×2): qty 1

## 2024-09-05 MED ORDER — MORPHINE SULFATE (PF) 2 MG/ML IV SOLN
2.0000 mg | Freq: Once | INTRAVENOUS | Status: AC
  Administered 2024-09-05: 2 mg via INTRAVENOUS
  Filled 2024-09-05: qty 1

## 2024-09-05 MED ORDER — FLUTICASONE PROPIONATE 50 MCG/ACT NA SUSP: 2.0000 | Freq: Every day | NASAL | Status: DC | PRN

## 2024-09-05 MED ORDER — MORPHINE SULFATE 10 MG/5ML PO SOLN
5.0000 mg | ORAL | Status: DC | PRN
  Administered 2024-09-05: 5 mg via ORAL
  Filled 2024-09-05: qty 5

## 2024-09-05 NOTE — ED Notes (Signed)
 Patient transported to CT

## 2024-09-05 NOTE — ED Triage Notes (Signed)
 Pt presents to the ED via [facility] from home. Pt reports abdominal pain x1 week. Pt states that when he lies down he feels like he can't breath. Pt reports nausea denies vomiting. Pt A&Ox4.  171/97 97% RA HR 87 CBG 161 - pt hx of type two diabetes

## 2024-09-05 NOTE — H&P (Signed)
 History and Physical    Francisco Gibbs FMW:[id number] DOB: [birth year] DOA: 09/05/2024  PCP: Adina Buel HERO, MD (Confirm with patient/family/NH records and if not entered, this has to be entered at [facility] point of entry) Patient coming from: Home  I have personally briefly reviewed patient's old medical records in [facility]  Chief Complaint: Belly hurts  HPI: Francisco Gibbs is a 89 y.o. male with medical history significant of COPD, GERD, HTN/chronic HFpEF, HLD, IIDM, diabetic neuropathy, aortic stenosis, BPH with chronic urinary incontinence, presented with new onset of chest pain.  Symptoms started about 2 weeks ago, patient started to have intermittent epigastric abdominal pain, radiating to left side, but denied any nauseous vomiting diarrhea.  He last ate about 20-25 pounds in 6 months.  ED Course: Temperature 98.3 on tachycardia blood pressure 150/70 O2 saturation 100% on room air.  CT abdomen pelvis showed obstructing pancreatic mass with upstream ductal dilation and atrophy, blood work showed AST 23 ALT 15 bilirubin 0.5 BUN 33 creatinine 1.3 WBC 4.1 hemoglobin 11.4.  Review of Systems: As per HPI otherwise 14 point review of systems negative.    Past Medical History:  Diagnosis Date   Aortic stenosis    Basal cell carcinoma 03/16/2020   R tip of nose, R preauricular area, Moh's 06/06/2020   COPD (chronic obstructive pulmonary disease) (HCC)    Diabetes mellitus without complication (HCC)    Pt takes Metformin .   GERD (gastroesophageal reflux disease)    HLD (hyperlipidemia)    Hypertension     Past Surgical History:  Procedure Laterality Date   CAROTID PTA/STENT INTERVENTION Left 02/25/2021   Procedure: CAROTID PTA/STENT INTERVENTION;  Surgeon: Marea Selinda RAMAN, MD;  Location: [facility] INVASIVE CV LAB;  Service: Cardiovascular;  Laterality: Left;   HYDROCELE EXCISION / REPAIR     INGUINAL HERNIA REPAIR     KNEE SURGERY     LEFT HEART CATH AND CORONARY ANGIOGRAPHY Left 01/30/2021    Procedure: LEFT HEART CATH AND CORONARY ANGIOGRAPHY;  Surgeon: Florencio Cara BIRCH, MD;  Location: [facility] INVASIVE CV LAB;  Service: Cardiovascular;  Laterality: Left;     reports that he has quit smoking. His smoking use included cigarettes. He has a 70 pack-year smoking history. He has been exposed to tobacco smoke. He has never used smokeless tobacco. He reports that he does not currently use alcohol. He reports that he does not use drugs.  Allergies  Allergen Reactions   Guaifenesin  Shortness Of Breath    Other reaction(s): Other (See Comments)   Penicillin G Rash and Shortness Of Breath    Has patient had a PCN reaction causing immediate rash, facial/tongue/throat swelling, SOB or lightheadedness with hypotension: Yes Has patient had a PCN reaction causing severe rash involving mucus membranes or skin necrosis: No Has patient had a PCN reaction that required hospitalization: No Has patient had a PCN reaction occurring within the last 10 years: No If all of the above answers are NO, then may proceed with Cephalosporin use. Has patient had a PCN reaction causing immediate rash, facial/tongue/throat swelling, SOB or lightheadedness with hypotension: Yes Has patient had a PCN reaction causing severe rash involving mucus membranes or skin necrosis: No Has patient had a PCN reaction that required hospitalization: No Has patient had a PCN reaction occurring within the last 10 years: No If all of the above answers are NO, then may proceed with Cephalosporin use. Has patient had a PCN reaction causing immediate rash, facial/tongue/throat swelling, SOB or lightheadedness  with hypotension: Yes Has patient had a PCN reaction causing severe rash involving mucus membranes or skin necrosis: No Has patient had a PCN reaction that required hospitalization: No Has patient had a PCN reaction occurring within the last 10 years: No If all of the above answers are NO, then may proceed with Cephalosporin  use. Has patient had a PCN reaction causing immediate rash, facial/tongue/throat swelling, SOB or lightheadedness with hypotension: Yes Has patient had a PCN reaction causing severe rash involving mucus membranes or skin necrosis: No Has patient had a PCN reaction that required hospitalization: No Has patient had a PCN reaction occurring within the last 10 years: NoIf all of the above answers are NO, then may proceed with Cephalosporin use. Has patient had a PCN reaction causing immediate rash, facial/tongue/throat swelling, SOB or lightheadedness with hypotension: Yes Has patient had a PCN reaction causing se... (TRUNCATED)   Sulfa Antibiotics Shortness Of Breath    Family History  Problem Relation Age of Onset   Diabetes Sister    Diabetes Paternal Aunt      Prior to Admission medications   Medication Sig Start Date End Date Taking? Authorizing Provider  albuterol  (PROVENTIL  HFA;VENTOLIN  HFA) 108 (90 Base) MCG/ACT inhaler Inhale 2 puffs into the lungs every 4 (four) hours as needed for wheezing or shortness of breath.   Yes [provider]  amLODipine  (NORVASC ) 10 MG tablet Take 10 mg by mouth daily.   Yes [provider]  aspirin  81 MG chewable tablet Chew 81 mg by mouth daily.    Yes [provider]  cetirizine (ZYRTEC) 10 MG tablet Take 10 mg by mouth every evening.    Yes [provider]  FEROSUL 325 (65 Fe) MG tablet Take 325 mg by mouth 2 (two) times daily. 06/15/22  Yes [provider]  finasteride (PROSCAR) 5 MG tablet Take 5 mg by mouth daily. 08/19/24  Yes [provider]  fluticasone  (FLONASE ) 50 MCG/ACT nasal spray Place 2 sprays into the nose every evening.    Yes [provider]  Fluticasone -Umeclidin-Vilant (TRELEGY ELLIPTA) 100-62.5-25 MCG/INH AEPB Inhale 1 puff into the lungs daily. 11/08/19  Yes [provider]  gabapentin  (NEURONTIN ) 300 MG capsule Take 300 mg by mouth 3 (three) times daily.    Yes  [provider]  hydrALAZINE  (APRESOLINE ) 100 MG tablet Take 100 mg by mouth 4 (four) times daily.   Yes [provider]  lisinopril  (ZESTRIL ) 20 MG tablet Take 20 mg by mouth daily. 09/28/23 09/27/24 Yes [provider]  magnesium  oxide (MAG-OX) 400 (241.3 Mg) MG tablet Take 1 tablet (400 mg total) by mouth daily. 04/29/18  Yes Konidena, Snehalatha, MD  metFORMIN  (GLUCOPHAGE ) 500 MG tablet Take 1,000 mg by mouth 2 (two) times daily. 07/04/22  Yes [provider]  Multiple Vitamins-Minerals (MULTIVITAMIN WITH MINERALS) tablet Take 1 tablet by mouth daily.   Yes [provider]  sertraline  (ZOLOFT ) 25 MG tablet Take 50 mg by mouth daily. 12/26/20  Yes [provider]  tamsulosin  (FLOMAX ) 0.4 MG CAPS capsule Take 1 capsule (0.4 mg total) by mouth daily after supper. 11/13/22  Yes Francisca Redell BROCKS, MD  famotidine  (PEPCID ) 20 MG tablet Take 20 mg by mouth 2 (two) times daily.    [provider]  ipratropium-albuterol  (DUONEB) 0.5-2.5 (3) MG/3ML SOLN Take 3 mLs by nebulization every 6 (six) hours as needed. Patient not taking: Reported on 09/05/2024 06/28/18   Sainani, Vivek J, MD  SPS, SODIUM POLYSTYRENE SULF, 15  GM/60ML suspension Take 15 g by mouth. Twice per week 10/19/23   [provider]    Physical Exam: Vitals:   09/05/24 0930 09/05/24 1000 09/05/24 1030 09/05/24 1124  BP:  139/62 (!) 149/66   Pulse: 77 76 70   Resp: 14 13 13    Temp:    98.4 F (36.9 C)  TempSrc:    Oral  SpO2: 91% 92% 97%   Weight:      Height:        Constitutional: NAD, calm, comfortable Vitals:   09/05/24 0930 09/05/24 1000 09/05/24 1030 09/05/24 1124  BP:  139/62 (!) 149/66   Pulse: 77 76 70   Resp: 14 13 13    Temp:    98.4 F (36.9 C)  TempSrc:    Oral  SpO2: 91% 92% 97%   Weight:      Height:       Eyes: PERRL, lids and conjunctivae normal ENMT: Mucous membranes are moist. Posterior pharynx clear of any exudate or lesions.Normal  dentition.  Neck: normal, supple, no masses, no thyromegaly Respiratory: clear to auscultation bilaterally, no wheezing, no crackles. Normal respiratory effort. No accessory muscle use.  Cardiovascular: Regular rate and rhythm, no murmurs / rubs / gallops. No extremity edema. 2+ pedal pulses. No carotid bruits.  Abdomen: no tenderness, no masses palpated. No hepatosplenomegaly. Bowel sounds positive.  Musculoskeletal: no clubbing / cyanosis. No joint deformity upper and lower extremities. Good ROM, no contractures. Normal muscle tone.  Skin: no rashes, lesions, ulcers. No induration Neurologic: CN 2-12 grossly intact. Sensation intact, DTR normal. Strength 5/5 in all 4.  Psychiatric: Normal judgment and insight. Alert and oriented x 3. Normal mood.   Labs on Admission: I have personally reviewed following labs and imaging studies  CBC: Recent Labs  Lab 09/05/24 0732  WBC 4.1  NEUTROABS 2.8  HGB 11.4*  HCT 36.5*  MCV 93.6  PLT 181   Basic Metabolic Panel: Recent Labs  Lab 09/05/24 0732  NA 136  K 5.1  CL 103  CO2 19*  GLUCOSE 162*  BUN 33*  CREATININE 1.35*  CALCIUM  9.8   GFR: Estimated Creatinine Clearance: 34.7 mL/min (A) (by C-G formula based on SCr of 1.35 mg/dL (H)). Liver Function Tests: Recent Labs  Lab 09/05/24 0732  AST 23  ALT 15  ALKPHOS 38  BILITOT 0.5  PROT 7.5  ALBUMIN 4.3   Recent Labs  Lab 09/05/24 0732  LIPASE 63*   No results for input(s): AMMONIA in the last 168 hours. Coagulation Profile: No results for input(s): INR, PROTIME in the last 168 hours. Cardiac Enzymes: No results for input(s): CKTOTAL, CKMB, CKMBINDEX, TROPONINI in the last 168 hours. BNP (last 3 results) No results for input(s): PROBNP in the last 8760 hours. HbA1C: No results for input(s): HGBA1C in the last 72 hours. CBG: No results for input(s): GLUCAP in the last 168 hours. Lipid Profile: No results for input(s): CHOL, HDL, LDLCALC,  TRIG, CHOLHDL, LDLDIRECT in the last 72 hours. Thyroid  Function Tests: No results for input(s): TSH, T4TOTAL, FREET4, T3FREE, THYROIDAB in the last 72 hours. Anemia Panel: No results for input(s): VITAMINB12, FOLATE, FERRITIN, TIBC, IRON, RETICCTPCT in the last 72 hours. Urine analysis:    Component Value Date/Time   COLORURINE STRAW (A) 09/05/2024 0748   APPEARANCEUR CLEAR (A) 09/05/2024 0748   APPEARANCEUR Clear 07/14/2022 1259   LABSPEC 1.011 09/05/2024 0748   LABSPEC 1.021 12/09/2011 1539   PHURINE 6.0 09/05/2024 0748   GLUCOSEU NEGATIVE 09/05/2024  0748   GLUCOSEU >=500 12/09/2011 1539   HGBUR NEGATIVE 09/05/2024 0748   BILIRUBINUR NEGATIVE 09/05/2024 0748   BILIRUBINUR Negative 07/14/2022 1259   BILIRUBINUR Negative 12/09/2011 1539   KETONESUR NEGATIVE 09/05/2024 0748   PROTEINUR NEGATIVE 09/05/2024 0748   NITRITE NEGATIVE 09/05/2024 0748   LEUKOCYTESUR NEGATIVE 09/05/2024 0748   LEUKOCYTESUR Negative 12/09/2011 1539    Radiological Exams on Admission: CT ABDOMEN PELVIS W CONTRAST Result Date: 09/05/2024 CLINICAL DATA:  Abdominal pain.  * Tracking Code: BO * EXAM: CT ABDOMEN AND PELVIS WITH CONTRAST TECHNIQUE: Multidetector CT imaging of the abdomen and pelvis was performed using the standard protocol following bolus administration of intravenous contrast. RADIATION DOSE REDUCTION: This exam was performed according to the departmental dose-optimization program which includes automated exposure control, adjustment of the mA and/or kV according to patient size and/or use of iterative reconstruction technique. CONTRAST:  OMNIPAQUE  IOHEXOL  300 MG/ML  SOLN COMPARISON:  12/09/2011. FINDINGS: Lower chest: Centrilobular emphysema. No acute findings. Heart is enlarged. No pericardial or pleural effusion. Distal esophagus is grossly unremarkable. Hepatobiliary: Small cyst in the dome of the liver. Liver and gallbladder are otherwise unremarkable. No biliary  ductal dilatation. Pancreas: 1.5 x 1.9 cm pancreatic neck mass with upstream ductal dilatation and gland atrophy involving the body and proximal tail. Spleen: Negative. Adrenals/Urinary Tract: Low-attenuation lesions in the kidneys. No specific follow-up necessary. Ureters are decompressed. Nodular prostate indents the bladder base. Stomach/Bowel: Proximal gastric wall appears thickened but the stomach is under distended. Appendix is fluid-filled and measures up to 8 mm (2/47). No periappendiceal inflammatory stranding or fluid. Stomach, small bowel, appendix and colon are otherwise grossly unremarkable. Vascular/Lymphatic: Atherosclerotic calcification of the aorta. No pathologically enlarged lymph nodes. Reproductive: Prostate is enlarged, indenting the bladder base. Other: No free fluid.  Mesenteries and peritoneum are unremarkable. Musculoskeletal: Osteopenia. Degenerative changes in the spine. Dextroconvex scoliosis. IMPRESSION: 1. Obstructing pancreatic neck mass is highly worrisome for adenocarcinoma. Associated upstream ductal dilatation gland atrophy. 2. Appendix is minimally dilated but is thin walled and without periappendiceal inflammatory stranding. Appendicitis is considered unlikely. 3. Gastric wall thickening. 4. Nodular prostate. 5.  Aortic atherosclerosis (ICD10-I70.0). 6.  Emphysema (ICD10-J43.9). Electronically Signed   By: Newell Eke M.D.   On: 09/05/2024 09:58    EKG: Independently reviewed.  Sinus rhythm, no acute ST changes.  Assessment/Plan Principal Problem:   Pancreatic cancer [facility]) Active Problems:   Pancreatic mass  (please populate well all problems here in Problem List. (For example, if patient is on BP meds at home and you resume or decide to hold them, it is a problem that needs to be her. Same for CAD, COPD, HLD and so on)   Pancreatic mass - Likely pancreatic cancer - Prognosis poor, estimated 6 months of life to live.  Delivered message of pancreatic mass and  poor prognosis to patient, patient expired at he still desires full code at this point even though he understand the poor prognosis with or without treatment alike.  Patient further requested that different treatment options, I put consult to oncology.  Patient agreed with palliative care as well. - Discussed with GI attending regarding possibility of pancreatic duct stenting to relieve his symptoms.    HTN - Stable, continue home BP meds  IIDM - Hold home metformin  as patient received IV contrast today - SSI  COPD - Stable, no acute exacerbation - Continue ICS and LABA - Continue as needed DuoNebs  BPH with chronic urinary incontinence - Continue Flomax  and Proscar  UA negative for UTI-  Total time spent on patient care 75 minutes.  DVT prophylaxis: SCD Code Status: Full code Family Communication: Left patient's niece message Disposition Plan: Patient is sick with newly onset pancreatic cancer requiring multidiscipline management including oncology and GI, expect more than 2 midnight hospital stay Consults called: Oncology, GI, palliative care Admission status: Telemetry admission   Cort ONEIDA Mana MD [facility] Pager [phone]  09/05/2024, 11:56 AM

## 2024-09-05 NOTE — ED Notes (Signed)
 This RN assisted pt to stand at bedside to urinate. Urine sample collected. Pt returned to bed at this time. Call light within reach and bed alarm on.

## 2024-09-05 NOTE — Consult Note (Signed)
 Rogelia Copping, MD [facility]  [address] [place], [place] [address] Phone: [phone] Fax : [phone]  Consultation  Referring Provider:     Dr. Laurita Primary Care Physician:  Adina Buel HERO, MD Primary Gastroenterologist: Sampson         Reason for Consultation:     Pancreatic mass  Date of Admission:  09/05/2024 Date of Consultation:  09/05/2024         HPI:   Francisco Gibbs is a 89 y.o. male who is admitted with abdominal pain.  The patient has a history of GERD, hypertension, hyperlipidemia, diabetes and is hard of hearing.  The patient came in with abdominal pain for the last week.  There has been associated nausea without any vomiting.  The patient had a CT scan of the abdomen today that showed:  IMPRESSION: 1. Obstructing pancreatic neck mass is highly worrisome for adenocarcinoma. Associated upstream ductal dilatation gland atrophy. 2. Appendix is minimally dilated but is thin walled and without periappendiceal inflammatory stranding. Appendicitis is considered unlikely. 3. Gastric wall thickening. 4. Nodular prostate. 5.  Aortic atherosclerosis (ICD10-I70.0). 6.  Emphysema   The patient's liver enzymes were normal and his lipase was only slightly elevated at 63.  The patient's hemoglobin was 11.4 with a previous hemoglobin being around 8.5 on average.  It was reported that the patient's symptoms started 2 weeks ago with a 20 to 25 pound weight loss in the last 6 months.  Past Medical History:  Diagnosis Date   Aortic stenosis    Basal cell carcinoma 03/16/2020   R tip of nose, R preauricular area, Moh's 06/06/2020   COPD (chronic obstructive pulmonary disease) (HCC)    Diabetes mellitus without complication (HCC)    Pt takes Metformin .   GERD (gastroesophageal reflux disease)    HLD (hyperlipidemia)    Hypertension     Past Surgical History:  Procedure Laterality Date   CAROTID PTA/STENT INTERVENTION Left 02/25/2021   Procedure: CAROTID  PTA/STENT INTERVENTION;  Surgeon: Marea Selinda RAMAN, MD;  Location: [facility] INVASIVE CV LAB;  Service: Cardiovascular;  Laterality: Left;   HYDROCELE EXCISION / REPAIR     INGUINAL HERNIA REPAIR     KNEE SURGERY     LEFT HEART CATH AND CORONARY ANGIOGRAPHY Left 01/30/2021   Procedure: LEFT HEART CATH AND CORONARY ANGIOGRAPHY;  Surgeon: Florencio Cara BIRCH, MD;  Location: [facility] INVASIVE CV LAB;  Service: Cardiovascular;  Laterality: Left;    Prior to Admission medications   Medication Sig Start Date End Date Taking? Authorizing Provider  albuterol  (PROVENTIL  HFA;VENTOLIN  HFA) 108 (90 Base) MCG/ACT inhaler Inhale 2 puffs into the lungs every 4 (four) hours as needed for wheezing or shortness of breath.   Yes [provider]  amLODipine  (NORVASC ) 10 MG tablet Take 10 mg by mouth daily.   Yes [provider]  aspirin  81 MG chewable tablet Chew 81 mg by mouth daily.    Yes [provider]  cetirizine (ZYRTEC) 10 MG tablet Take 10 mg by mouth every evening.    Yes [provider]  FEROSUL 325 (65 Fe) MG tablet Take 325 mg by mouth 2 (two) times daily. 06/15/22  Yes [provider]  finasteride (PROSCAR) 5 MG tablet Take 5 mg by mouth daily. 08/19/24  Yes [provider]  fluticasone  (FLONASE ) 50 MCG/ACT nasal spray Place 2 sprays into the nose every evening.    Yes [provider]  Fluticasone -Umeclidin-Vilant (TRELEGY ELLIPTA) 100-62.5-25 MCG/INH  AEPB Inhale 1 puff into the lungs daily. 11/08/19  Yes [provider]  gabapentin  (NEURONTIN ) 300 MG capsule Take 300 mg by mouth 3 (three) times daily.    Yes [provider]  hydrALAZINE  (APRESOLINE ) 100 MG tablet Take 100 mg by mouth 4 (four) times daily.   Yes [provider]  lisinopril  (ZESTRIL ) 20 MG tablet Take 20 mg by mouth daily. 09/28/23 09/27/24 Yes [provider]  magnesium  oxide (MAG-OX) 400 (241.3 Mg) MG tablet Take 1 tablet (400 mg total) by mouth daily.  04/29/18  Yes Konidena, Snehalatha, MD  metFORMIN  (GLUCOPHAGE ) 500 MG tablet Take 1,000 mg by mouth 2 (two) times daily. 07/04/22  Yes [provider]  Multiple Vitamins-Minerals (MULTIVITAMIN WITH MINERALS) tablet Take 1 tablet by mouth daily.   Yes [provider]  sertraline  (ZOLOFT ) 25 MG tablet Take 50 mg by mouth daily. 12/26/20  Yes [provider]  tamsulosin  (FLOMAX ) 0.4 MG CAPS capsule Take 1 capsule (0.4 mg total) by mouth daily after supper. 11/13/22  Yes Francisca Redell BROCKS, MD  famotidine  (PEPCID ) 20 MG tablet Take 20 mg by mouth 2 (two) times daily.    [provider]  ipratropium-albuterol  (DUONEB) 0.5-2.5 (3) MG/3ML SOLN Take 3 mLs by nebulization every 6 (six) hours as needed. Patient not taking: Reported on 09/05/2024 06/28/18   Sainani, Vivek J, MD  SPS, SODIUM POLYSTYRENE SULF, 15 GM/60ML suspension Take 15 g by mouth. Twice per week 10/19/23   [provider]    Family History  Problem Relation Age of Onset   Diabetes Sister    Diabetes Paternal Aunt      Social History   Tobacco Use   Smoking status: Former    Current packs/day: 1.00    Average packs/day: 1 pack/day for 70.0 years (70.0 ttl pk-yrs)    Types: Cigarettes    Passive exposure: Past   Smokeless tobacco: Never  Substance Use Topics   Alcohol use: Not Currently   Drug use: Never    Allergies as of 09/05/2024 - Review Complete 09/05/2024  Allergen Reaction Noted   Guaifenesin  Shortness Of Breath 12/23/2007   Penicillin g Rash and Shortness Of Breath 12/23/2007   Sulfa antibiotics Shortness Of Breath 12/23/2007    Review of Systems:    All systems reviewed and negative except where noted in HPI.   Physical Exam:  Vital signs in last 24 hours: Temp:  [97.4 F (36.3 C)-98.4 F (36.9 C)] 97.4 F (36.3 C) (10/06 1233) Pulse Rate:  [70-100] 100 (10/06 1233) Resp:  [13-20] 20 (10/06 1233) BP: (139-176)/(62-89) 176/87 (10/06 1233) SpO2:  [91 %-100 %] 95 %  (10/06 1233) Weight:  [71 kg] 71 kg (10/06 0718)   General:   Pleasant, cooperative in NAD Head:  Normocephalic and atraumatic. Eyes:   No icterus.   Conjunctiva pink. PERRLA. Ears:  Normal auditory acuity. Neck:  Supple; no masses or thyroidomegaly Lungs: Respirations even and unlabored. Lungs clear to auscultation bilaterally.   No wheezes, crackles, or rhonchi.  Heart:  Regular rate and rhythm;  Without murmur, clicks, rubs or gallops Abdomen:  Soft, nondistended, nontender. Normal bowel sounds. No appreciable masses or hepatomegaly.  No rebound or guarding.  Rectal:  Not performed. Msk:  Symmetrical without gross deformities.    Extremities:  Without edema, cyanosis or clubbing. Neurologic:  Alert and oriented x3;  grossly normal neurologically except for hard of hearing. Skin:  Intact without significant lesions or rashes. Cervical Nodes:  No significant cervical  adenopathy. Psych:  Alert and cooperative. Normal affect.  LAB RESULTS: Recent Labs    09/05/24 0732  WBC 4.1  HGB 11.4*  HCT 36.5*  PLT 181   BMET Recent Labs    09/05/24 0732  NA 136  K 5.1  CL 103  CO2 19*  GLUCOSE 162*  BUN 33*  CREATININE 1.35*  CALCIUM  9.8   LFT Recent Labs    09/05/24 0732  PROT 7.5  ALBUMIN 4.3  AST 23  ALT 15  ALKPHOS 38  BILITOT 0.5   PT/INR No results for input(s): LABPROT, INR in the last 72 hours.  STUDIES: CT ABDOMEN PELVIS W CONTRAST Result Date: 09/05/2024 CLINICAL DATA:  Abdominal pain.  * Tracking Code: BO * EXAM: CT ABDOMEN AND PELVIS WITH CONTRAST TECHNIQUE: Multidetector CT imaging of the abdomen and pelvis was performed using the standard protocol following bolus administration of intravenous contrast. RADIATION DOSE REDUCTION: This exam was performed according to the departmental dose-optimization program which includes automated exposure control, adjustment of the mA and/or kV according to patient size and/or use of iterative reconstruction technique.  CONTRAST:  OMNIPAQUE  IOHEXOL  300 MG/ML  SOLN COMPARISON:  12/09/2011. FINDINGS: Lower chest: Centrilobular emphysema. No acute findings. Heart is enlarged. No pericardial or pleural effusion. Distal esophagus is grossly unremarkable. Hepatobiliary: Small cyst in the dome of the liver. Liver and gallbladder are otherwise unremarkable. No biliary ductal dilatation. Pancreas: 1.5 x 1.9 cm pancreatic neck mass with upstream ductal dilatation and gland atrophy involving the body and proximal tail. Spleen: Negative. Adrenals/Urinary Tract: Low-attenuation lesions in the kidneys. No specific follow-up necessary. Ureters are decompressed. Nodular prostate indents the bladder base. Stomach/Bowel: Proximal gastric wall appears thickened but the stomach is under distended. Appendix is fluid-filled and measures up to 8 mm (2/47). No periappendiceal inflammatory stranding or fluid. Stomach, small bowel, appendix and colon are otherwise grossly unremarkable. Vascular/Lymphatic: Atherosclerotic calcification of the aorta. No pathologically enlarged lymph nodes. Reproductive: Prostate is enlarged, indenting the bladder base. Other: No free fluid.  Mesenteries and peritoneum are unremarkable. Musculoskeletal: Osteopenia. Degenerative changes in the spine. Dextroconvex scoliosis. IMPRESSION: 1. Obstructing pancreatic neck mass is highly worrisome for adenocarcinoma. Associated upstream ductal dilatation gland atrophy. 2. Appendix is minimally dilated but is thin walled and without periappendiceal inflammatory stranding. Appendicitis is considered unlikely. 3. Gastric wall thickening. 4. Nodular prostate. 5.  Aortic atherosclerosis (ICD10-I70.0). 6.  Emphysema (ICD10-J43.9). Electronically Signed   By: Newell Eke M.D.   On: 09/05/2024 09:58      Impression / Plan:   Assessment: Principal Problem:   Pancreatic cancer [facility]) Active Problems:   Pancreatic mass   Francisco Gibbs is a 89 y.o. y/o male with with a  pancreatic mass found in the neck of the pancreas that was reported to be obstructing.  There was associated upstream ductal dilatation with gland atrophy.  There was also gastric wall thickening seen.  I am being consulted now for possible pancreatic stenting of this obstruction.  Plan:  I discussed the procedure with the patient including the possibility that it can help with his pain and possibly not affect his pain at all.  He has also been told that this may not improve his prognosis.  The patient keeps voicing the opinion that he wants to go home.  I have told him that we could set him up for an ERCP for tomorrow with an attempt at a pancreatic stent.  The patient reports that he wants to talk to his family  and decide whether or not he will proceed with the ER  I have instructed him to let the nurse know if he wants to proceed with the procedure which would be available tomorrow since I will be out of town the rest of the week after tomorrow.  He reports that he will let the nursing staff know if he decides to proceed with the procedure.  Thank you for involving me in the care of this patient.      LOS: 0 days   Rogelia Copping, MD, MD. NOLIA 09/05/2024, 1:24 PM,  Pager [phone] 7am-5pm  Check AMION for 5pm -7am coverage and on weekends   Note: This dictation was prepared with Dragon dictation along with smaller phrase technology. Any transcriptional errors that result from this process are unintentional.

## 2024-09-05 NOTE — Consult Note (Signed)
 Hematology/Oncology Consult note [phone] [phone]      Patient Care Team: Adina Buel HERO, MD as PCP - General (Family Medicine)   Name of the patient: Francisco Gibbs  [id number]  [birth year]   REASON FOR COSULTATION:  Pancreatic mass History of presenting illness-  89 y.o. male with PMH of COPD, GERD, hypertension,chronic HFpEF, hyperlipidemia, type 2 diabetes, neuropathy, aortic stenosis, BPH, urinary incontinence presented to emergency room for new onset of chest pain/epigastric discomfort, radiated to the left side.  Patient denies nausea vomiting diarrhea.  He has noted about unintentional weight loss of 20 pounds in the past 6 months.  CT abdomen pelvis with contrast showed obstructing pancreatic neck mass highly worrisome for adenocarcinoma.  Associated with upstream ductal dilatation gland atrophy.  Gastric wall thickening.  Nodular prostate, emphysema, aortic atherosclerosis.  Appendix is minimally dilated.    Allergies  Allergen Reactions   Guaifenesin  Shortness Of Breath    Other reaction(s): Other (See Comments)   Penicillin G Rash and Shortness Of Breath    Has patient had a PCN reaction causing immediate rash, facial/tongue/throat swelling, SOB or lightheadedness with hypotension: Yes Has patient had a PCN reaction causing severe rash involving mucus membranes or skin necrosis: No Has patient had a PCN reaction that required hospitalization: No Has patient had a PCN reaction occurring within the last 10 years: No If all of the above answers are NO, then may proceed with Cephalosporin use. Has patient had a PCN reaction causing immediate rash, facial/tongue/throat swelling, SOB or lightheadedness with hypotension: Yes Has patient had a PCN reaction causing severe rash involving mucus membranes or skin necrosis: No Has patient had a PCN reaction that required hospitalization: No Has patient had a PCN reaction occurring within the last 10  years: No If all of the above answers are NO, then may proceed with Cephalosporin use. Has patient had a PCN reaction causing immediate rash, facial/tongue/throat swelling, SOB or lightheadedness with hypotension: Yes Has patient had a PCN reaction causing severe rash involving mucus membranes or skin necrosis: No Has patient had a PCN reaction that required hospitalization: No Has patient had a PCN reaction occurring within the last 10 years: No If all of the above answers are NO, then may proceed with Cephalosporin use. Has patient had a PCN reaction causing immediate rash, facial/tongue/throat swelling, SOB or lightheadedness with hypotension: Yes Has patient had a PCN reaction causing severe rash involving mucus membranes or skin necrosis: No Has patient had a PCN reaction that required hospitalization: No Has patient had a PCN reaction occurring within the last 10 years: NoIf all of the above answers are NO, then may proceed with Cephalosporin use. Has patient had a PCN reaction causing immediate rash, facial/tongue/throat swelling, SOB or lightheadedness with hypotension: Yes Has patient had a PCN reaction causing se... (TRUNCATED)   Sulfa Antibiotics Shortness Of Breath    Patient Active Problem List   Diagnosis Date Noted   Pancreatic mass 09/05/2024   Pancreatic cancer (HCC) 09/05/2024   Sepsis due to pneumonia (HCC) 11/03/2023   Acute respiratory failure with hypoxia (HCC) 11/03/2023   Dyslipidemia 11/03/2023   Type 2 diabetes mellitus with peripheral neuropathy (HCC) 11/03/2023   BPH (benign prostatic hyperplasia) 11/03/2023   Carotid stenosis, left 02/25/2021   Carotid stenosis 01/15/2021   Benign prostatic hyperplasia 04/09/2020   Stage 3b chronic kidney disease (HCC) 04/09/2020   Hyperkalemia 10/19/2018   Sepsis (HCC) 06/25/2018   CAP (community acquired pneumonia) 06/25/2018   Hypomagnesemia  04/28/2018   Generalized edema 03/18/2018   Thyroid  nodule 11/02/2017    Low back pain 10/08/2017   Weight loss 06/01/2017   Edema of foot 02/05/2017   Urinary incontinence 08/19/2016   Constipation 03/27/2016   Allergic rhinitis 05/02/2015   Leg pain 02/14/2015   Increased frequency of urination 12/11/2014   Nicotine dependence, uncomplicated 06/22/2014   Testicular pain 06/22/2014   Diabetic peripheral neuropathy (HCC) 10/06/2013   Encounter for general adult medical examination without abnormal findings 06/09/2013   Acute bronchitis 12/21/2012   Essential hypertension 03/31/2012   Personal history of nicotine dependence 03/31/2012   Hoarseness 02/19/2011   Age-related cataract 11/14/2010   Polymyalgia rheumatica 11/14/2010   Foreign body granuloma of muscle 02/07/2009   Pain in shoulder 02/07/2009   Neoplasm of unspecified behavior of bone, soft tissue, and skin 01/28/2008   Hyperlipidemia 12/23/2007   Gastro-esophageal reflux disease without esophagitis 12/23/2007   Chronic obstructive pulmonary disease (HCC) 12/23/2007   Diabetes mellitus, type II (HCC) 12/23/2007   Anxiety 12/23/2007   Depression 12/23/2007   History of colonic polyps 12/23/2007   Hydrocele 12/23/2007   Hypercalcemia 12/23/2007   Nephrolithiasis 12/23/2007   Preglaucoma 12/23/2007     Past Medical History:  Diagnosis Date   Aortic stenosis    Basal cell carcinoma 03/16/2020   R tip of nose, R preauricular area, Moh's 06/06/2020   COPD (chronic obstructive pulmonary disease) (HCC)    Diabetes mellitus without complication (HCC)    Pt takes Metformin .   GERD (gastroesophageal reflux disease)    HLD (hyperlipidemia)    Hypertension      Past Surgical History:  Procedure Laterality Date   CAROTID PTA/STENT INTERVENTION Left 02/25/2021   Procedure: CAROTID PTA/STENT INTERVENTION;  Surgeon: Marea Selinda RAMAN, MD;  Location: [facility];  Service: Cardiovascular;  Laterality: Left;   HYDROCELE EXCISION / REPAIR     INGUINAL HERNIA REPAIR     KNEE SURGERY     LEFT  HEART CATH AND CORONARY ANGIOGRAPHY Left 01/30/2021   Procedure: LEFT HEART CATH AND CORONARY ANGIOGRAPHY;  Surgeon: Florencio Cara BIRCH, MD;  Location: [facility];  Service: Cardiovascular;  Laterality: Left;    Social History   Socioeconomic History   Marital status: Divorced    Spouse name: Not on file   Number of children: Not on file   Years of education: Not on file   Highest education level: Not on file  Occupational History   Not on file  Tobacco Use   Smoking status: Former    Current packs/day: 1.00    Average packs/day: 1 pack/day for 70.0 years (70.0 ttl pk-yrs)    Types: Cigarettes    Passive exposure: Past   Smokeless tobacco: Never  Substance and Sexual Activity   Alcohol use: Not Currently   Drug use: Never   Sexual activity: Not Currently  Other Topics Concern   Not on file  Social History Narrative   Not on file   Social Drivers of Health   Financial Resource Strain: Low Risk  (06/15/2023)   Received from [facility]   Overall Financial Resource Strain (CARDIA)    Difficulty of Paying Living Expenses: Not hard at all  Food Insecurity: No Food Insecurity (09/05/2024)   Hunger Vital Sign    Worried About Running Out of Food in the Last Year: Never true    Ran Out of Food in the Last Year: Never true  Transportation Needs: No Transportation Needs (09/05/2024)  PRAPARE - Administrator, Civil Service (Medical): No    Lack of Transportation (Non-Medical): No  Physical Activity: Not on file  Stress: Not on file  Social Connections: Patient Declined (09/05/2024)   Social Connection and Isolation Panel    Frequency of Communication with Friends and Family: Patient declined    Frequency of Social Gatherings with Friends and Family: Patient declined    Attends Religious Services: Patient declined    Database administrator or Organizations: Patient declined    Attends Banker Meetings: Patient declined     Marital Status: Patient declined  Intimate Partner Violence: Not At Risk (09/05/2024)   Humiliation, Afraid, Rape, and Kick questionnaire    Fear of Current or Ex-Partner: No    Emotionally Abused: No    Physically Abused: No    Sexually Abused: No     Family History  Problem Relation Age of Onset   Diabetes Sister    Diabetes Paternal Aunt      Current Facility-Administered Medications:    acetaminophen  (TYLENOL ) tablet 650 mg, 650 mg, Oral, Q6H PRN **OR** acetaminophen  (TYLENOL ) suppository 650 mg, 650 mg, Rectal, Q6H PRN, Laurita Manor T, MD   albuterol  (PROVENTIL ) (2.5 MG/3ML) 0.083% nebulizer solution 2.5 mg, 2.5 mg, Inhalation, Q4H PRN, Laurita Manor T, MD   amLODipine  (NORVASC ) tablet 10 mg, 10 mg, Oral, Daily, Zhang, Ping T, MD   aspirin  chewable tablet 81 mg, 81 mg, Oral, Daily, Zhang, Ping T, MD   budesonide -glycopyrrolate-formoterol (BREZTRI) 160-9-4.8 MCG/ACT inhaler 2 puff, 2 puff, Inhalation, BID, Laurita Manor T, MD, 2 puff at 09/05/24 2052   docusate sodium  (COLACE) capsule 100 mg, 100 mg, Oral, BID PRN, Zhang, Ping T, MD   famotidine  (PEPCID ) tablet 20 mg, 20 mg, Oral, BID, Zhang, Ping T, MD, 20 mg at 09/05/24 2016   [START ON 09/06/2024] feeding supplement (ENSURE PLUS HIGH PROTEIN) liquid 237 mL, 237 mL, Oral, BID BM, Zhang, Dekui, MD   [START ON 09/06/2024] finasteride (PROSCAR) tablet 5 mg, 5 mg, Oral, Daily, Zhang, Ping T, MD   fluticasone  (FLONASE ) 50 MCG/ACT nasal spray 2 spray, 2 spray, Each Nare, Daily PRN, Laurita Manor T, MD   gabapentin  (NEURONTIN ) capsule 300 mg, 300 mg, Oral, TID, Laurita, Ping T, MD, 300 mg at 09/05/24 2017   hydrALAZINE  (APRESOLINE ) tablet 100 mg, 100 mg, Oral, QID, Laurita Manor T, MD, 100 mg at 09/05/24 2016   HYDROmorphone  (DILAUDID ) injection 0.5-1 mg, 0.5-1 mg, Intravenous, Q2H PRN, Laurita Manor T, MD   insulin  aspart (novoLOG ) injection 0-9 Units, 0-9 Units, Subcutaneous, TID WC, Laurita Manor T, MD, 1 Units at 09/05/24 1700   lisinopril  (ZESTRIL )  tablet 20 mg, 20 mg, Oral, Daily, Zhang, Ping T, MD   loratadine  (CLARITIN ) tablet 10 mg, 10 mg, Oral, Daily, Laurita Manor T, MD   morphine  10 MG/5ML solution 5 mg, 5 mg, Oral, Q2H PRN, Zhang, Ping T, MD, 5 mg at 09/05/24 2017   ondansetron  (ZOFRAN ) tablet 4 mg, 4 mg, Oral, Q6H PRN **OR** ondansetron  (ZOFRAN ) injection 4 mg, 4 mg, Intravenous, Q6H PRN, Laurita Manor T, MD   polyethylene glycol (MIRALAX / GLYCOLAX) packet 17 g, 17 g, Oral, Daily PRN, Laurita Manor T, MD   sertraline  (ZOLOFT ) tablet 50 mg, 50 mg, Oral, Daily, Zhang, Ping T, MD   tamsulosin  (FLOMAX ) capsule 0.4 mg, 0.4 mg, Oral, QPC supper, Laurita Manor T, MD, 0.4 mg at 09/05/24 1804  Review of Systems  Constitutional:  Positive for appetite change, fatigue  and unexpected weight change. Negative for chills and fever.  HENT:   Positive for hearing loss. Negative for voice change.   Eyes:  Negative for eye problems.  Respiratory:  Negative for chest tightness and cough.   Cardiovascular:  Negative for chest pain.  Gastrointestinal:  Positive for abdominal pain. Negative for abdominal distention and blood in stool.  Endocrine: Negative for hot flashes.  Genitourinary:  Negative for difficulty urinating and frequency.   Musculoskeletal:  Negative for arthralgias.  Skin:  Negative for itching and rash.  Neurological:  Negative for extremity weakness.  Hematological:  Negative for adenopathy.  Psychiatric/Behavioral:  Negative for confusion.     PHYSICAL EXAM Vitals:   09/05/24 1124 09/05/24 1233 09/05/24 1600 09/05/24 2026  BP:  (!) 176/87 (!) 156/52 (!) 148/70  Pulse:  100  97  Resp:  20 19 17   Temp: 98.4 F (36.9 C) (!) 97.4 F (36.3 C) 97.7 F (36.5 C) (!) 97.4 F (36.3 C)  TempSrc: Oral Oral    SpO2:  95% 97% 99%  Weight:      Height:       Physical Exam Constitutional:      General: He is not in acute distress.    Appearance: He is not diaphoretic.     Comments: Frail appearance  HENT:     Head: Normocephalic and  atraumatic.  Cardiovascular:     Rate and Rhythm: Normal rate and regular rhythm.     Heart sounds: No murmur heard. Pulmonary:     Effort: Pulmonary effort is normal. No respiratory distress.     Comments: Decreased breath sound bilaterally Abdominal:     General: There is no distension.     Palpations: Abdomen is soft.     Tenderness: There is no abdominal tenderness.  Musculoskeletal:        General: Normal range of motion.     Cervical back: Normal range of motion and neck supple.  Skin:    General: Skin is dry.     Findings: No erythema.  Neurological:     Mental Status: He is alert and oriented to person, place, and time. Mental status is at baseline.     Motor: No abnormal muscle tone.  Psychiatric:        Mood and Affect: Mood and affect normal.       LABORATORY STUDIES    Latest Ref Rng & Units 09/05/2024    7:32 AM 11/06/2023    5:01 AM 11/05/2023    4:43 AM  CBC  WBC 4.0 - 10.5 K/uL 4.1  11.9  16.8   Hemoglobin 13.0 - 17.0 g/dL 88.5  8.7  7.9   Hematocrit 39.0 - 52.0 % 36.5  27.2  24.9   Platelets 150 - 400 K/uL 181  158  141       Latest Ref Rng & Units 09/05/2024    7:32 AM 11/06/2023    5:01 AM 11/05/2023    4:43 AM  CMP  Glucose 70 - 99 mg/dL 837  881  842   BUN 8 - 23 mg/dL 33  30  42   Creatinine 0.61 - 1.24 mg/dL 8.64  8.76  8.44   Sodium 135 - 145 mmol/L 136  135  135   Potassium 3.5 - 5.1 mmol/L 5.1  4.9  5.4   Chloride 98 - 111 mmol/L 103  103  105   CO2 22 - 32 mmol/L 19  25  24    Calcium  8.9 -  10.3 mg/dL 9.8  9.1  8.8   Total Protein 6.5 - 8.1 g/dL 7.5     Total Bilirubin 0.0 - 1.2 mg/dL 0.5     Alkaline Phos 38 - 126 U/L 38     AST 15 - 41 U/L 23     ALT 0 - 44 U/L 15        RADIOGRAPHIC STUDIES: I have personally reviewed the radiological images as listed and agreed with the findings in the report. CT ABDOMEN PELVIS W CONTRAST Result Date: 09/05/2024 CLINICAL DATA:  Abdominal pain.  * Tracking Code: BO * EXAM: CT ABDOMEN AND PELVIS  WITH CONTRAST TECHNIQUE: Multidetector CT imaging of the abdomen and pelvis was performed using the standard protocol following bolus administration of intravenous contrast. RADIATION DOSE REDUCTION: This exam was performed according to the departmental dose-optimization program which includes automated exposure control, adjustment of the mA and/or kV according to patient size and/or use of iterative reconstruction technique. CONTRAST:  OMNIPAQUE  IOHEXOL  300 MG/ML  SOLN COMPARISON:  12/09/2011. FINDINGS: Lower chest: Centrilobular emphysema. No acute findings. Heart is enlarged. No pericardial or pleural effusion. Distal esophagus is grossly unremarkable. Hepatobiliary: Small cyst in the dome of the liver. Liver and gallbladder are otherwise unremarkable. No biliary ductal dilatation. Pancreas: 1.5 x 1.9 cm pancreatic neck mass with upstream ductal dilatation and gland atrophy involving the body and proximal tail. Spleen: Negative. Adrenals/Urinary Tract: Low-attenuation lesions in the kidneys. No specific follow-up necessary. Ureters are decompressed. Nodular prostate indents the bladder base. Stomach/Bowel: Proximal gastric wall appears thickened but the stomach is under distended. Appendix is fluid-filled and measures up to 8 mm (2/47). No periappendiceal inflammatory stranding or fluid. Stomach, small bowel, appendix and colon are otherwise grossly unremarkable. Vascular/Lymphatic: Atherosclerotic calcification of the aorta. No pathologically enlarged lymph nodes. Reproductive: Prostate is enlarged, indenting the bladder base. Other: No free fluid.  Mesenteries and peritoneum are unremarkable. Musculoskeletal: Osteopenia. Degenerative changes in the spine. Dextroconvex scoliosis. IMPRESSION: 1. Obstructing pancreatic neck mass is highly worrisome for adenocarcinoma. Associated upstream ductal dilatation gland atrophy. 2. Appendix is minimally dilated but is thin walled and without periappendiceal  inflammatory stranding. Appendicitis is considered unlikely. 3. Gastric wall thickening. 4. Nodular prostate. 5.  Aortic atherosclerosis (ICD10-I70.0). 6.  Emphysema (ICD10-J43.9). Electronically Signed   By: Newell Eke M.D.   On: 09/05/2024 09:58     Assessment and plan-   # Pancreatic mass obstructing pancreatic duct. Suspect primary pancreatic cancer. No definitive lymphadenopathy or liver metastasis on CT scan. Checks CA 19 9 and CEA. GI has been consulted, patient has agreed with ERCP for pancreatic stent placement.  This may help his symptoms. He has normal LFT, no signs of obstructive jaundice. We discussed about goals of care.  If he is interested in finding out diagnosis, he will need biopsy via EUS.  He can get procedure done outpatient at [facility]. Given his age, multiple morbidities, his treatment option is limited.  He is not a candidate for definitive surgery, or aggressive chemotherapy treatments.  If he desires treatments, can consider SBRT which may offer benefit in disease control and symptom relief.  If he does not want further treatments, hospice is reasonable. He is currently undecided.  Consult palliative care for further discussion of goals of care.  Thank you for allowing me to participate in the care of this patient.   Zelphia Cap, MD, PhD Hematology Oncology 09/05/2024

## 2024-09-05 NOTE — Progress Notes (Signed)
 Spoke to the patient and the patient niece about the wrist and benefits of an ERCP with stent placement. After discussing this with the patient and the literature not supporting much of a benefit for pain in this situation the risks outweigh the benefits, and we have agreed not to proceed with any pant extending at this time. The patient should have an ES and a CA 19-9 sent out for his work up of pancreatic cancer in addition to any further recommendations from oncology.

## 2024-09-05 NOTE — ED Provider Notes (Signed)
 [facility] Provider Note    Event Date/Time   First MD Initiated Contact with Patient 09/05/24 [phone]     (approximate)   History   Abdominal Pain   HPI  Francisco Gibbs is a 89 y.o. male GERD, hypertension, hyperlipidemia, diabetes, presenting with abdominal pain for the last week.  States that he has some nausea, intermittent dysuria, no diarrhea or vomiting.  Did note some nausea.  No fever, no chest pain or shortness of breath.  States the pain is right-sided and suprapubic.  Per independent history from EMS, patient has been having abdominal pain for a week, his blood glucose was 161, blood pressure was systolic 170.    On independent chart review, he was admitted in 2024 for sepsis secondary to pneumonia.  Was hypoxic at that time.  He denies any cough or shortness of breath at this time.   Physical Exam   Triage Vital Signs: ED Triage Vitals  Encounter Vitals Group     BP 09/05/24 0720 (!) 160/89     Girls Systolic BP Percentile --      Girls Diastolic BP Percentile --      Boys Systolic BP Percentile --      Boys Diastolic BP Percentile --      Pulse Rate 09/05/24 0720 87     Resp 09/05/24 0720 16     Temp 09/05/24 0720 98.3 F (36.8 C)     Temp Source 09/05/24 0720 Oral     SpO2 09/05/24 0720 100 %     Weight 09/05/24 0718 156 lb 8.4 oz (71 kg)     Height 09/05/24 0718 5' 7 (1.702 m)     Head Circumference --      Peak Flow --      Pain Score 09/05/24 0717 6     Pain Loc --      Pain Education --      Exclude from Growth Chart --     Most recent vital signs: Vitals:   09/05/24 0930 09/05/24 1000  BP:  139/62  Pulse: 77 76  Resp: 14 13  Temp:    SpO2: 91% 92%     General: Awake, no distress.  CV:  Good peripheral perfusion.  Resp:  Normal effort.  No tachypnea or respiratory distress Abd:  No distention.  Soft, mildly tender to the right abdomen and suprapubic region without guarding Other:  No flank or CVA tenderness, no  overlying rash   ED Results / Procedures / Treatments   Labs (all labs ordered are listed, but only abnormal results are displayed) Labs Reviewed  COMPREHENSIVE METABOLIC PANEL WITH GFR - Abnormal; Notable for the following components:      Result Value   CO2 19 (*)    Glucose, Bld 162 (*)    BUN 33 (*)    Creatinine, Ser 1.35 (*)    GFR, Estimated 50 (*)    All other components within normal limits  CBC WITH DIFFERENTIAL/PLATELET - Abnormal; Notable for the following components:   RBC 3.90 (*)    Hemoglobin 11.4 (*)    HCT 36.5 (*)    All other components within normal limits  URINALYSIS, W/ REFLEX TO CULTURE (INFECTION SUSPECTED) - Abnormal; Notable for the following components:   Color, Urine STRAW (*)    APPearance CLEAR (*)    All other components within normal limits  LIPASE, BLOOD - Abnormal; Notable for the following components:   Lipase 63 (*)  All other components within normal limits  TROPONIN I (HIGH SENSITIVITY)     EKG  EKG shows, sinus rhythm, rate 79, normal QS, normal QTc, T wave inversion to aVL, no obvious ischemic ST elevation, not significantly changed compared to prior   RADIOLOGY On my independent interpretation, CT shows a pancreatic mass    PROCEDURES:  Critical Care performed: No  Procedures   MEDICATIONS ORDERED IN ED: Medications  sodium chloride  0.9 % bolus 1,000 mL (1,000 mLs Intravenous New Bag/Given 09/05/24 0909)  morphine  (PF) 2 MG/ML injection 2 mg (2 mg Intravenous Given 09/05/24 0907)  iohexol  (OMNIPAQUE ) 300 MG/ML solution 100 mL (100 mLs Intravenous Contrast Given 09/05/24 0849)     IMPRESSION / MDM / ASSESSMENT AND PLAN / ED COURSE  I reviewed the triage vital signs and the nursing notes.                              Differential diagnosis includes, but is not limited to, colitis, diverticulitis, UTI, viral illness, constipation, atypical ACS.  Labs, EKG, troponin, CT.  Patient's presentation is most consistent with  acute presentation with potential threat to life or bodily function.  Independent interpretation of labs and imaging below.  Will give patient some fluids as well as IV morphine .  CT imaging shows an obstructed pancreatic mass with upstream ductal dilatation.  Discussed with patient about imaging and lab results including these findings, he is agreeable with plan to stay for admission and likely biliary stenting.  He has no right lower quadrant abdominal pain at this time.  Consult the hospitalist was agreeable with the plan for admission and will evaluate the patient.  He is admitted.    Clinical Course as of 09/05/24 1033  Mon Sep 05, 2024  9170 Independent review of labs, electrolytes not severely deranged, LFTs are normal, mild AKI, UA is not consistent with UTI, no leukocytosis. [TT]  1019 CT ABDOMEN PELVIS W CONTRAST IMPRESSION: 1. Obstructing pancreatic neck mass is highly worrisome for adenocarcinoma. Associated upstream ductal dilatation gland atrophy. 2. Appendix is minimally dilated but is thin walled and without periappendiceal inflammatory stranding. Appendicitis is considered unlikely. 3. Gastric wall thickening. 4. Nodular prostate. 5.  Aortic atherosclerosis (ICD10-I70.0). 6.  Emphysema (ICD10-J43.9).   [TT]  1019 Lipase(!): 63 Mildly elevated [TT]    Clinical Course User Index [TT] Waymond Lorelle Cummins, MD     FINAL CLINICAL IMPRESSION(S) / ED DIAGNOSES   Final diagnoses:  Abdominal pain of multiple sites  Pancreatic mass     Rx / DC Orders   ED Discharge Orders     None        Note:  This document was prepared using Dragon voice recognition software and may include unintentional dictation errors.    Waymond Lorelle Cummins, MD 09/05/24 [phone]

## 2024-09-06 ENCOUNTER — Other Ambulatory Visit: Payer: Self-pay

## 2024-09-06 DIAGNOSIS — C259 Malignant neoplasm of pancreas, unspecified: Secondary | ICD-10-CM | POA: Diagnosis not present

## 2024-09-06 DIAGNOSIS — Z515 Encounter for palliative care: Secondary | ICD-10-CM

## 2024-09-06 DIAGNOSIS — R1085 Abdominal pain of multiple sites: Secondary | ICD-10-CM | POA: Diagnosis not present

## 2024-09-06 DIAGNOSIS — C253 Malignant neoplasm of pancreatic duct: Secondary | ICD-10-CM | POA: Diagnosis not present

## 2024-09-06 LAB — CBC
HCT: 30 % — ABNORMAL LOW (ref 39.0–52.0)
Hemoglobin: 9.9 g/dL — ABNORMAL LOW (ref 13.0–17.0)
MCH: 30.7 pg (ref 26.0–34.0)
MCHC: 33 g/dL (ref 30.0–36.0)
MCV: 92.9 fL (ref 80.0–100.0)
Platelets: 152 K/uL (ref 150–400)
RBC: 3.23 MIL/uL — ABNORMAL LOW (ref 4.22–5.81)
RDW: 13.5 % (ref 11.5–15.5)
WBC: 4.7 K/uL (ref 4.0–10.5)
nRBC: 0 % (ref 0.0–0.2)

## 2024-09-06 LAB — HEPATIC FUNCTION PANEL
ALT: 11 U/L (ref 0–44)
AST: 16 U/L (ref 15–41)
Albumin: 3.7 g/dL (ref 3.5–5.0)
Alkaline Phosphatase: 39 U/L (ref 38–126)
Bilirubin, Direct: <0.1 mg/dL (ref 0.0–0.2)
Total Bilirubin: 0.5 mg/dL (ref 0.0–1.2)
Total Protein: 6.2 g/dL — ABNORMAL LOW (ref 6.5–8.1)

## 2024-09-06 LAB — GLUCOSE, CAPILLARY
Glucose-Capillary: 116 mg/dL — ABNORMAL HIGH (ref 70–99)
Glucose-Capillary: 145 mg/dL — ABNORMAL HIGH (ref 70–99)

## 2024-09-06 LAB — LIPASE, BLOOD: Lipase: 58 U/L — ABNORMAL HIGH (ref 11–51)

## 2024-09-06 MED ORDER — OXYCODONE HCL 5 MG PO TABS
5.0000 mg | ORAL_TABLET | Freq: Three times a day (TID) | ORAL | 0 refills | Status: DC | PRN
  Filled 2024-09-06: qty 21, 7d supply, fill #0

## 2024-09-06 NOTE — Consult Note (Signed)
 Consultation Note Date: 09/06/2024   Patient Name: Francisco Gibbs  DOB: [birth year]  MRN: [id number]  Age / Sex: 89 y.o., male  PCP: Adina Buel HERO, MD Referring Physician: Jhonny Calvin NOVAK, MD  Reason for Consultation: Establishing goals of care  HPI/Patient Profile: Per H&P Francisco Gibbs is a 89 y.o. male with medical history significant of COPD, GERD, HTN/chronic HFpEF, HLD, IIDM, diabetic neuropathy, aortic stenosis, BPH with chronic urinary incontinence, presented with new onset of chest pain.   Symptoms started about 2 weeks ago, patient started to have intermittent epigastric abdominal pain, radiating to left side, but denied any nauseous vomiting diarrhea.  He last ate about 20-25 pounds in 6 months.  Clinical Assessment and Goals of Care: Notes, labs, diagnostics reviewed.  Patient with obstructing pancreatic neck mass that is highly worrisome for adenocarcinoma.  GI note reviewed with no plans for ERCP with stenting.  Oncology note reviewed patient is not a candidate for definitive surgery or aggressive chemotherapy, but could consider SBRT.  In to see patient.  He is currently resting in bed at this time. He agrees to talk.  He states he is grateful for his care.  He discusses his past from years ago.  He discusses enjoying the outdoors, and hunting and fishing.  With attempting further conversation, patient was unable to hear what I was saying over the voices of the semiprivate bed next to him.  Discussed talking at another time.  Exited room and spoke with [facility] oncology NP Borders, as patient was evaluated by Dr. Babara at [facility].  Discussed status, updates, and options.  He states he will speak with patient regarding goals of care and potential oncologic intervention moving forward.     SUMMARY OF RECOMMENDATIONS   Patient to be followed by Sidra Mower NP.     Primary Diagnoses: Present on  Admission:  Pancreatic cancer (HCC)   I have reviewed the medical record, interviewed the patient and family, and examined the patient. The following aspects are pertinent.  Past Medical History:  Diagnosis Date   Aortic stenosis    Basal cell carcinoma 03/16/2020   R tip of nose, R preauricular area, Moh's 06/06/2020   COPD (chronic obstructive pulmonary disease) (HCC)    Diabetes mellitus without complication (HCC)    Pt takes Metformin .   GERD (gastroesophageal reflux disease)    HLD (hyperlipidemia)    Hypertension    Social History   Socioeconomic History   Marital status: Divorced    Spouse name: Not on file   Number of children: Not on file   Years of education: Not on file   Highest education level: Not on file  Occupational History   Not on file  Tobacco Use   Smoking status: Former    Current packs/day: 1.00    Average packs/day: 1 pack/day for 70.0 years (70.0 ttl pk-yrs)    Types: Cigarettes    Passive exposure: Past   Smokeless tobacco: Never  Substance and Sexual Activity   Alcohol use: Not  Currently   Drug use: Never   Sexual activity: Not Currently  Other Topics Concern   Not on file  Social History Narrative   Not on file   Social Drivers of Health   Financial Resource Strain: Low Risk  (06/15/2023)   Received from [facility]   Overall Financial Resource Strain (CARDIA)    Difficulty of Paying Living Expenses: Not hard at all  Food Insecurity: No Food Insecurity (09/05/2024)   Hunger Vital Sign    Worried About Running Out of Food in the Last Year: Never true    Ran Out of Food in the Last Year: Never true  Transportation Needs: No Transportation Needs (09/05/2024)   PRAPARE - Administrator, Civil Service (Medical): No    Lack of Transportation (Non-Medical): No  Physical Activity: Not on file  Stress: Not on file  Social Connections: Patient Declined (09/05/2024)   Social Connection and Isolation Panel     Frequency of Communication with Friends and Family: Patient declined    Frequency of Social Gatherings with Friends and Family: Patient declined    Attends Religious Services: Patient declined    Database administrator or Organizations: Patient declined    Attends Engineer, structural: Patient declined    Marital Status: Patient declined   Family History  Problem Relation Age of Onset   Diabetes Sister    Diabetes Paternal Aunt    Scheduled Meds:  amLODipine   10 mg Oral Daily   aspirin   81 mg Oral Daily   budesonide -glycopyrrolate-formoterol  2 puff Inhalation BID   famotidine   20 mg Oral BID   feeding supplement  237 mL Oral BID BM   finasteride  5 mg Oral Daily   gabapentin   300 mg Oral TID   hydrALAZINE   100 mg Oral QID   lisinopril   20 mg Oral Daily   loratadine   10 mg Oral Daily   sertraline   50 mg Oral Daily   tamsulosin   0.4 mg Oral QPC supper   Continuous Infusions: PRN Meds:.acetaminophen  **OR** acetaminophen , albuterol , docusate sodium , fluticasone , HYDROmorphone  (DILAUDID ) injection, morphine , ondansetron  **OR** ondansetron  (ZOFRAN ) IV, polyethylene glycol Medications Prior to Admission:  Prior to Admission medications   Medication Sig Start Date End Date Taking? Authorizing Provider  albuterol  (PROVENTIL  HFA;VENTOLIN  HFA) 108 (90 Base) MCG/ACT inhaler Inhale 2 puffs into the lungs every 4 (four) hours as needed for wheezing or shortness of breath.   Yes [provider]  amLODipine  (NORVASC ) 10 MG tablet Take 10 mg by mouth daily.   Yes [provider]  aspirin  81 MG chewable tablet Chew 81 mg by mouth daily.    Yes [provider]  cetirizine (ZYRTEC) 10 MG tablet Take 10 mg by mouth every evening.    Yes [provider]  FEROSUL 325 (65 Fe) MG tablet Take 325 mg by mouth 2 (two) times daily. 06/15/22  Yes [provider]  finasteride (PROSCAR) 5 MG tablet Take 5 mg by mouth daily. 08/19/24  Yes [provider]  fluticasone  (FLONASE ) 50 MCG/ACT nasal spray Place 2 sprays into the nose every evening.    Yes [provider]  Fluticasone -Umeclidin-Vilant (TRELEGY ELLIPTA) 100-62.5-25 MCG/INH AEPB Inhale 1 puff into the lungs daily. 11/08/19  Yes [provider]  gabapentin  (NEURONTIN ) 300 MG capsule Take 300 mg by mouth 3 (three) times daily.    Yes [provider]  hydrALAZINE  (APRESOLINE ) 100 MG tablet Take 100 mg by mouth 4 (  four) times daily.   Yes [provider]  lisinopril  (ZESTRIL ) 20 MG tablet Take 20 mg by mouth daily. 09/28/23 09/27/24 Yes [provider]  magnesium  oxide (MAG-OX) 400 (241.3 Mg) MG tablet Take 1 tablet (400 mg total) by mouth daily. 04/29/18  Yes Konidena, Snehalatha, MD  metFORMIN  (GLUCOPHAGE ) 500 MG tablet Take 1,000 mg by mouth 2 (two) times daily. 07/04/22  Yes [provider]  Multiple Vitamins-Minerals (MULTIVITAMIN WITH MINERALS) tablet Take 1 tablet by mouth daily.   Yes [provider]  sertraline  (ZOLOFT ) 25 MG tablet Take 50 mg by mouth daily. 12/26/20  Yes [provider]  tamsulosin  (FLOMAX ) 0.4 MG CAPS capsule Take 1 capsule (0.4 mg total) by mouth daily after supper. 11/13/22  Yes Francisca Redell BROCKS, MD  famotidine  (PEPCID ) 20 MG tablet Take 20 mg by mouth 2 (two) times daily.    [provider]  ipratropium-albuterol  (DUONEB) 0.5-2.5 (3) MG/3ML SOLN Take 3 mLs by nebulization every 6 (six) hours as needed. Patient not taking: Reported on 09/05/2024 06/28/18   Sainani, Vivek J, MD  SPS, SODIUM POLYSTYRENE SULF, 15 GM/60ML suspension Take 15 g by mouth. Twice per week 10/19/23   [provider]   Allergies  Allergen Reactions   Guaifenesin  Shortness Of Breath    Other reaction(s): Other (See Comments)   Penicillin G Rash and Shortness Of Breath    Has patient had a PCN reaction causing immediate rash, facial/tongue/throat swelling, SOB or lightheadedness with hypotension: Yes Has  patient had a PCN reaction causing severe rash involving mucus membranes or skin necrosis: No Has patient had a PCN reaction that required hospitalization: No Has patient had a PCN reaction occurring within the last 10 years: No If all of the above answers are NO, then may proceed with Cephalosporin use. Has patient had a PCN reaction causing immediate rash, facial/tongue/throat swelling, SOB or lightheadedness with hypotension: Yes Has patient had a PCN reaction causing severe rash involving mucus membranes or skin necrosis: No Has patient had a PCN reaction that required hospitalization: No Has patient had a PCN reaction occurring within the last 10 years: No If all of the above answers are NO, then may proceed with Cephalosporin use. Has patient had a PCN reaction causing immediate rash, facial/tongue/throat swelling, SOB or lightheadedness with hypotension: Yes Has patient had a PCN reaction causing severe rash involving mucus membranes or skin necrosis: No Has patient had a PCN reaction that required hospitalization: No Has patient had a PCN reaction occurring within the last 10 years: No If all of the above answers are NO, then may proceed with Cephalosporin use. Has patient had a PCN reaction causing immediate rash, facial/tongue/throat swelling, SOB or lightheadedness with hypotension: Yes Has patient had a PCN reaction causing severe rash involving mucus membranes or skin necrosis: No Has patient had a PCN reaction that required hospitalization: No Has patient had a PCN reaction occurring within the last 10 years: NoIf all of the above answers are NO, then may proceed with Cephalosporin use. Has patient had a PCN reaction causing immediate rash, facial/tongue/throat swelling, SOB or lightheadedness with hypotension: Yes Has patient had a PCN reaction causing se... (TRUNCATED)   Sulfa Antibiotics Shortness Of Breath   Review of Systems  Constitutional:        Denies complaints   All other systems reviewed and are negative.   Physical Exam Pulmonary:     Effort: Pulmonary effort is normal.  Skin:    General: Skin is  warm and dry.  Neurological:     Mental Status: He is alert.     Vital Signs: BP (!) 117/56 (BP Location: Left Arm)   Pulse 75   Temp 98.8 F (37.1 C) (Oral)   Resp 15   Ht 5' 7 (1.702 m)   Wt 71 kg   SpO2 93%   BMI 24.52 kg/m  Pain Scale: 0-10   Pain Score: 0-No pain   SpO2: SpO2: 93 % O2 Device:SpO2: 93 % O2 Flow Rate: .   IO: Intake/output summary:  Intake/Output Summary (Last 24 hours) at 09/06/2024 1111 Last data filed at 09/06/2024 0900 Gross per 24 hour  Intake 240 ml  Output 1400 ml  Net -1160 ml    LBM: Last BM Date : 09/05/24 Baseline Weight: Weight: 71 kg Most recent weight: Weight: 71 kg       Signed by: Camelia Lewis, NP   Please contact Palliative Medicine Team phone at [phone] for questions and concerns.  For individual provider: See Tracey

## 2024-09-06 NOTE — Discharge Summary (Signed)
 Physician Discharge Summary  Francisco Gibbs FMW:[id number] DOB: 11/13/1933 DOA: 09/05/2024  PCP: Adina Buel HERO, MD  Admit date: 09/05/2024 Discharge date: 09/06/2024  Admitted From: Home Disposition:  Home with hospice  Recommendations for Outpatient Follow-up:  Per hospice  Home Health:No  Equipment/Devices:None   Discharge Condition:Stable  CODE STATUS:FULL  Diet recommendation: Reg  Brief/Interim Summary:  89 y.o. male with medical history significant of COPD, GERD, HTN/chronic HFpEF, HLD, IIDM, diabetic neuropathy, aortic stenosis, BPH with chronic urinary incontinence, presented with new onset of chest pain.   Symptoms started about 2 weeks ago, patient started to have intermittent epigastric abdominal pain, radiating to left side, but denied any nauseous vomiting diarrhea.  He last ate about 20-25 pounds in 6 months.  Consultation sought from oncology, palliative care, hospice liaison.  After discussions with multiple members of the care team patient made decision to discharge back home and engage hospice services.  No further DME is necessary.  Narcotics have been sent to [facility] outpatient pharmacy and would be delivered to patient prior to discharge.    Discharge Diagnoses:  Principal Problem:   Pancreatic cancer [facility]) Active Problems:   Pancreatic mass   Goals of care, counseling/discussion   Abdominal pain of multiple sites   Palliative care encounter  Pancreatic mass Intractable abdominal pain Imaging findings very concerning for pancreatic cancer.  Seen by oncology and palliative care.  Poor candidate for treatment and no plans to pursue ERCP or tissue diagnosis. Plan: Discharge home with outpatient hospice.  Narcotic sent to [facility] pharmacy   Discharge Instructions  Discharge Instructions     Diet - low sodium heart healthy   Complete by: As directed    Increase activity slowly   Complete by: As directed       Allergies as of 09/06/2024       Reactions    Guaifenesin  Shortness Of Breath   Other reaction(s): Other (See Comments)   Penicillin G Rash, Shortness Of Breath   Has patient had a PCN reaction causing immediate rash, facial/tongue/throat swelling, SOB or lightheadedness with hypotension: Yes Has patient had a PCN reaction causing severe rash involving mucus membranes or skin necrosis: No Has patient had a PCN reaction that required hospitalization: No Has patient had a PCN reaction occurring within the last 10 years: No If all of the above answers are NO, then may proceed with Cephalosporin use. Has patient had a PCN reaction causing immediate rash, facial/tongue/throat swelling, SOB or lightheadedness with hypotension: Yes Has patient had a PCN reaction causing severe rash involving mucus membranes or skin necrosis: No Has patient had a PCN reaction that required hospitalization: No Has patient had a PCN reaction occurring within the last 10 years: No If all of the above answers are NO, then may proceed with Cephalosporin use. Has patient had a PCN reaction causing immediate rash, facial/tongue/throat swelling, SOB or lightheadedness with hypotension: Yes Has patient had a PCN reaction causing severe rash involving mucus membranes or skin necrosis: No Has patient had a PCN reaction that required hospitalization: No Has patient had a PCN reaction occurring within the last 10 years: No If all of the above answers are NO, then may proceed with Cephalosporin use. Has patient had a PCN reaction causing immediate rash, facial/tongue/throat swelling, SOB or lightheadedness with hypotension: Yes Has patient had a PCN reaction causing severe rash involving mucus membranes or skin necrosis: No Has patient had a PCN reaction that required hospitalization: No Has patient had a PCN reaction  occurring within the last 10 years: NoIf all of the above answers are NO, then may proceed with Cephalosporin use. Has patient had a PCN reaction causing  immediate rash, facial/tongue/throat swelling, SOB or lightheadedness with hypotension: Yes Has patient had a PCN reaction causing se... (TRUNCATED)   Sulfa Antibiotics Shortness Of Breath        Medication List     STOP taking these medications    ipratropium-albuterol  0.5-2.5 (3) MG/3ML Soln Commonly known as: DUONEB       TAKE these medications    albuterol  108 (90 Base) MCG/ACT inhaler Commonly known as: VENTOLIN  HFA Inhale 2 puffs into the lungs every 4 (four) hours as needed for wheezing or shortness of breath.   amLODipine  10 MG tablet Commonly known as: NORVASC  Take 10 mg by mouth daily.   aspirin  81 MG chewable tablet Chew 81 mg by mouth daily.   cetirizine 10 MG tablet Commonly known as: ZYRTEC Take 10 mg by mouth every evening.   famotidine  20 MG tablet Commonly known as: PEPCID  Take 20 mg by mouth 2 (two) times daily.   FeroSul 325 (65 Fe) MG tablet Generic drug: ferrous sulfate  Take 325 mg by mouth 2 (two) times daily.   finasteride 5 MG tablet Commonly known as: PROSCAR Take 5 mg by mouth daily.   fluticasone  50 MCG/ACT nasal spray Commonly known as: FLONASE  Place 2 sprays into the nose every evening.   gabapentin  300 MG capsule Commonly known as: NEURONTIN  Take 300 mg by mouth 3 (three) times daily.   hydrALAZINE  100 MG tablet Commonly known as: APRESOLINE  Take 100 mg by mouth 4 (four) times daily.   lisinopril  20 MG tablet Commonly known as: ZESTRIL  Take 20 mg by mouth daily.   magnesium  oxide 400 (241.3 Mg) MG tablet Commonly known as: MAG-OX Take 1 tablet (400 mg total) by mouth daily.   metFORMIN  500 MG tablet Commonly known as: GLUCOPHAGE  Take 1,000 mg by mouth 2 (two) times daily.   multivitamin with minerals tablet Take 1 tablet by mouth daily.   oxyCODONE  5 MG immediate release tablet Commonly known as: Roxicodone  Take 1 tablet (5 mg total) by mouth every 8 (eight) hours as needed.   sertraline  25 MG tablet Commonly  known as: ZOLOFT  Take 50 mg by mouth daily.   SPS (Sodium Polystyrene Sulf) 15 GM/60ML suspension Generic drug: sodium polystyrene Take 15 g by mouth. Twice per week   tamsulosin  0.4 MG Caps capsule Commonly known as: FLOMAX  Take 1 capsule (0.4 mg total) by mouth daily after supper.   Trelegy Ellipta 100-62.5-25 MCG/INH Aepb Generic drug: Fluticasone -Umeclidin-Vilant Inhale 1 puff into the lungs daily.        Follow-up Information     Adamo, Buel HERO, MD Follow up.   Specialty: Family Medicine Why: hospital follow up Contact information: [address] [place] [address] [phone]                Allergies  Allergen Reactions   Guaifenesin  Shortness Of Breath    Other reaction(s): Other (See Comments)   Penicillin G Rash and Shortness Of Breath    Has patient had a PCN reaction causing immediate rash, facial/tongue/throat swelling, SOB or lightheadedness with hypotension: Yes Has patient had a PCN reaction causing severe rash involving mucus membranes or skin necrosis: No Has patient had a PCN reaction that required hospitalization: No Has patient had a PCN reaction occurring within the last 10 years: No If all of the above answers  are NO, then may proceed with Cephalosporin use. Has patient had a PCN reaction causing immediate rash, facial/tongue/throat swelling, SOB or lightheadedness with hypotension: Yes Has patient had a PCN reaction causing severe rash involving mucus membranes or skin necrosis: No Has patient had a PCN reaction that required hospitalization: No Has patient had a PCN reaction occurring within the last 10 years: No If all of the above answers are NO, then may proceed with Cephalosporin use. Has patient had a PCN reaction causing immediate rash, facial/tongue/throat swelling, SOB or lightheadedness with hypotension: Yes Has patient had a PCN reaction causing severe rash involving mucus membranes or skin necrosis: No Has patient had  a PCN reaction that required hospitalization: No Has patient had a PCN reaction occurring within the last 10 years: No If all of the above answers are NO, then may proceed with Cephalosporin use. Has patient had a PCN reaction causing immediate rash, facial/tongue/throat swelling, SOB or lightheadedness with hypotension: Yes Has patient had a PCN reaction causing severe rash involving mucus membranes or skin necrosis: No Has patient had a PCN reaction that required hospitalization: No Has patient had a PCN reaction occurring within the last 10 years: NoIf all of the above answers are NO, then may proceed with Cephalosporin use. Has patient had a PCN reaction causing immediate rash, facial/tongue/throat swelling, SOB or lightheadedness with hypotension: Yes Has patient had a PCN reaction causing se... (TRUNCATED)   Sulfa Antibiotics Shortness Of Breath    Consultations: Oncology Palliative care   Procedures/Studies: CT ABDOMEN PELVIS W CONTRAST Result Date: 09/05/2024 CLINICAL DATA:  Abdominal pain.  * Tracking Code: BO * EXAM: CT ABDOMEN AND PELVIS WITH CONTRAST TECHNIQUE: Multidetector CT imaging of the abdomen and pelvis was performed using the standard protocol following bolus administration of intravenous contrast. RADIATION DOSE REDUCTION: This exam was performed according to the departmental dose-optimization program which includes automated exposure control, adjustment of the mA and/or kV according to patient size and/or use of iterative reconstruction technique. CONTRAST:  OMNIPAQUE  IOHEXOL  300 MG/ML  SOLN COMPARISON:  12/09/2011. FINDINGS: Lower chest: Centrilobular emphysema. No acute findings. Heart is enlarged. No pericardial or pleural effusion. Distal esophagus is grossly unremarkable. Hepatobiliary: Small cyst in the dome of the liver. Liver and gallbladder are otherwise unremarkable. No biliary ductal dilatation. Pancreas: 1.5 x 1.9 cm pancreatic neck mass with upstream  ductal dilatation and gland atrophy involving the body and proximal tail. Spleen: Negative. Adrenals/Urinary Tract: Low-attenuation lesions in the kidneys. No specific follow-up necessary. Ureters are decompressed. Nodular prostate indents the bladder base. Stomach/Bowel: Proximal gastric wall appears thickened but the stomach is under distended. Appendix is fluid-filled and measures up to 8 mm (2/47). No periappendiceal inflammatory stranding or fluid. Stomach, small bowel, appendix and colon are otherwise grossly unremarkable. Vascular/Lymphatic: Atherosclerotic calcification of the aorta. No pathologically enlarged lymph nodes. Reproductive: Prostate is enlarged, indenting the bladder base. Other: No free fluid.  Mesenteries and peritoneum are unremarkable. Musculoskeletal: Osteopenia. Degenerative changes in the spine. Dextroconvex scoliosis. IMPRESSION: 1. Obstructing pancreatic neck mass is highly worrisome for adenocarcinoma. Associated upstream ductal dilatation gland atrophy. 2. Appendix is minimally dilated but is thin walled and without periappendiceal inflammatory stranding. Appendicitis is considered unlikely. 3. Gastric wall thickening. 4. Nodular prostate. 5.  Aortic atherosclerosis (ICD10-I70.0). 6.  Emphysema (ICD10-J43.9). Electronically Signed   By: Newell Eke M.D.   On: 09/05/2024 09:58      Subjective: Seen and examined on the day of discharge.  Stable no distress.  Appropriate for  discharge home.  Discharge Exam: Vitals:   09/06/24 0519 09/06/24 0808  BP: (!) 107/56 (!) 117/56  Pulse: 67 75  Resp: 18 15  Temp: 98.6 F (37 C) 98.8 F (37.1 C)  SpO2: 94% 93%   Vitals:   09/05/24 1233 09/06/24 0022 09/06/24 0519 09/06/24 0808  BP:  (!) 91/52 (!) 107/56 (!) 117/56  Pulse:  78 67 75  Resp:  18 18 15   Temp:  98.2 F (36.8 C) 98.6 F (37 C) 98.8 F (37.1 C)  TempSrc: Oral   Oral  SpO2:  93% 94% 93%  Weight:      Height:        General: Pt is alert, awake, not in  acute distress Cardiovascular: RRR, S1/S2 +, no rubs, no gallops Respiratory: CTA bilaterally, no wheezing, no rhonchi Abdominal: Soft, NT, ND, bowel sounds + Extremities: no edema, no cyanosis    The results of significant diagnostics from this hospitalization (including imaging, microbiology, ancillary and laboratory) are listed below for reference.     Microbiology: No results found for this or any previous visit (from the past 240 hours).   Labs: BNP (last 3 results) No results for input(s): BNP in the last 8760 hours. Basic Metabolic Panel: Recent Labs  Lab 09/05/24 0732  NA 136  K 5.1  CL 103  CO2 19*  GLUCOSE 162*  BUN 33*  CREATININE 1.35*  CALCIUM  9.8   Liver Function Tests: Recent Labs  Lab 09/05/24 0732 09/06/24 0354  AST 23 16  ALT 15 11  ALKPHOS 38 39  BILITOT 0.5 0.5  PROT 7.5 6.2*  ALBUMIN 4.3 3.7   Recent Labs  Lab 09/05/24 0732 09/06/24 0354  LIPASE 63* 58*   No results for input(s): AMMONIA in the last 168 hours. CBC: Recent Labs  Lab 09/05/24 0732 09/06/24 0354  WBC 4.1 4.7  NEUTROABS 2.8  --   HGB 11.4* 9.9*  HCT 36.5* 30.0*  MCV 93.6 92.9  PLT 181 152   Cardiac Enzymes: No results for input(s): CKTOTAL, CKMB, CKMBINDEX, TROPONINI in the last 168 hours. BNP: Invalid input(s): POCBNP CBG: Recent Labs  Lab 09/05/24 1809 09/05/24 1946 09/06/24 0810 09/06/24 1200  GLUCAP 128* 232* 116* 145*   D-Dimer No results for input(s): DDIMER in the last 72 hours. Hgb A1c Recent Labs    09/05/24 0732  HGBA1C 6.5*   Lipid Profile No results for input(s): CHOL, HDL, LDLCALC, TRIG, CHOLHDL, LDLDIRECT in the last 72 hours. Thyroid  function studies No results for input(s): TSH, T4TOTAL, T3FREE, THYROIDAB in the last 72 hours.  Invalid input(s): FREET3 Anemia work up No results for input(s): VITAMINB12, FOLATE, FERRITIN, TIBC, IRON, RETICCTPCT in the last 72 hours. Urinalysis     Component Value Date/Time   COLORURINE STRAW (A) 09/05/2024 0748   APPEARANCEUR CLEAR (A) 09/05/2024 0748   APPEARANCEUR Clear 07/14/2022 1259   LABSPEC 1.011 09/05/2024 0748   LABSPEC 1.021 12/09/2011 1539   PHURINE 6.0 09/05/2024 0748   GLUCOSEU NEGATIVE 09/05/2024 0748   GLUCOSEU >=500 12/09/2011 1539   HGBUR NEGATIVE 09/05/2024 0748   BILIRUBINUR NEGATIVE 09/05/2024 0748   BILIRUBINUR Negative 07/14/2022 1259   BILIRUBINUR Negative 12/09/2011 1539   KETONESUR NEGATIVE 09/05/2024 0748   PROTEINUR NEGATIVE 09/05/2024 0748   NITRITE NEGATIVE 09/05/2024 0748   LEUKOCYTESUR NEGATIVE 09/05/2024 0748   LEUKOCYTESUR Negative 12/09/2011 1539   Sepsis Labs Recent Labs  Lab 09/05/24 0732 09/06/24 0354  WBC 4.1 4.7   Microbiology No results found for this  or any previous visit (from the past 240 hours).   Time coordinating discharge: 40 minutes  SIGNED:   Calvin KATHEE Robson, MD  [facility] 09/06/2024, 4:40 PM Pager   If 7PM-7AM, please contact night-coverage

## 2024-09-06 NOTE — Care Management CC44 (Signed)
 Condition Code 44 Documentation Completed  Patient Details  Name: TIMM BONENBERGER MRN: [id number] Date of Birth: [birth year]   Condition Code 44 given:  Yes Patient signature on Condition Code 44 notice:  Yes Documentation of 2 MD's agreement:  Yes Code 44 added to claim:       Dalia GORMAN Fuse, RN 09/06/2024, 3:40 PM

## 2024-09-06 NOTE — Progress Notes (Signed)
 Mobility Specialist - Progress Note    09/06/24 1228  Mobility  Activity Ambulated with assistance;Stood at bedside  Level of Assistance Standby assist, set-up cues, supervision of patient - no hands on  Assistive Device Front wheel walker  Distance Ambulated (ft) 200 ft  Range of Motion/Exercises Active  Activity Response Tolerated well  Mobility Referral Yes  Mobility visit 1 Mobility  Mobility Specialist Start Time (ACUTE ONLY) 1216  Mobility Specialist Stop Time (ACUTE ONLY) 1229  Mobility Specialist Time Calculation (min) (ACUTE ONLY) 13 min   Pt resting in recliner on RA  upon entry. Pt STS and ambulates to hallway around NS SBA with RW. Pt given verbal cuing to slow pace to avoid falling. Pt returned to recliner and left with needs in reach.   Guido Rumble Mobility Specialist 09/06/24, 2:17 PM

## 2024-09-06 NOTE — Progress Notes (Signed)
 PROGRESS NOTE    Francisco Gibbs  FMW:[id number] DOB: 10-Oct-1933 DOA: 09/05/2024 PCP: Adina Buel HERO, MD    Brief Narrative:  89 y.o. male with medical history significant of COPD, GERD, HTN/chronic HFpEF, HLD, IIDM, diabetic neuropathy, aortic stenosis, BPH with chronic urinary incontinence, presented with new onset of chest pain.   Symptoms started about 2 weeks ago, patient started to have intermittent epigastric abdominal pain, radiating to left side, but denied any nauseous vomiting diarrhea.  He last ate about 20-25 pounds in 6 months.   Assessment & Plan:   Principal Problem:   Pancreatic cancer [facility]) Active Problems:   Pancreatic mass   Goals of care, counseling/discussion   Abdominal pain of multiple sites   Palliative care encounter  Pancreatic mass Intractable abdominal pain Imaging findings very concerning for pancreatic cancer.  Seen by oncology and palliative care.  Poor candidate for treatment and no plans to pursue ERCP or tissue diagnosis. Plan: Hospice referral Hospice liaison to speak with patient and family today No indication or plans for any invasive diagnostic testing Optimize pain regimen  HTN - Stable, continue home BP meds   IIDM - Hold home metformin  - SSI   COPD - Stable, no acute exacerbation - Continue ICS and LABA - Continue as needed DuoNebs   BPH with chronic urinary incontinence - Continue Flomax  and Proscar UA negative for UTI-    DVT prophylaxis: SCD Code Status: Full Family Communication: None today.  Patient's niece has been kept up-to-date by oncology, GI, palliative care Disposition Plan: Patient on observation status.  No medical intensity to support inpatient.  Pending conversation with hospice liaison and the decision to discharge home with hospice services.   Level of care: Telemetry Medical  Consultants:  Palliative care Oncology GI  Procedures:  None  Antimicrobials: None   Subjective: Seen and examined.   History limited by underlying hearing loss.  Endorses abdominal pain  Objective: Vitals:   09/05/24 1233 09/06/24 0022 09/06/24 0519 09/06/24 0808  BP:  (!) 91/52 (!) 107/56 (!) 117/56  Pulse:  78 67 75  Resp:  18 18 15   Temp:  98.2 F (36.8 C) 98.6 F (37 C) 98.8 F (37.1 C)  TempSrc: Oral   Oral  SpO2:  93% 94% 93%  Weight:      Height:        Intake/Output Summary (Last 24 hours) at 09/06/2024 1546 Last data filed at 09/06/2024 1300 Gross per 24 hour  Intake 480 ml  Output 600 ml  Net -120 ml   Filed Weights   09/05/24 0718  Weight: 71 kg    Examination:  General exam: NAD Respiratory system: Clear to auscultation. Respiratory effort normal. Cardiovascular system: Lungs clear.  Normal work of breathing.  Room air Gastrointestinal system: Diffusely tender.  Normal bowel sounds Central nervous system: Alert and oriented. No focal neurological deficits. Extremities: Symmetric 5 x 5 power. Skin: No rashes, lesions or ulcers Psychiatry: Judgement and insight appear normal. Mood & affect appropriate.     Data Reviewed: I have personally reviewed following labs and imaging studies  CBC: Recent Labs  Lab 09/05/24 0732 09/06/24 0354  WBC 4.1 4.7  NEUTROABS 2.8  --   HGB 11.4* 9.9*  HCT 36.5* 30.0*  MCV 93.6 92.9  PLT 181 152   Basic Metabolic Panel: Recent Labs  Lab 09/05/24 0732  NA 136  K 5.1  CL 103  CO2 19*  GLUCOSE 162*  BUN 33*  CREATININE 1.35*  CALCIUM  9.8   GFR: Estimated Creatinine Clearance: 34.7 mL/min (A) (by C-G formula based on SCr of 1.35 mg/dL (H)). Liver Function Tests: Recent Labs  Lab 09/05/24 0732 09/06/24 0354  AST 23 16  ALT 15 11  ALKPHOS 38 39  BILITOT 0.5 0.5  PROT 7.5 6.2*  ALBUMIN 4.3 3.7   Recent Labs  Lab 09/05/24 0732 09/06/24 0354  LIPASE 63* 58*   No results for input(s): AMMONIA in the last 168 hours. Coagulation Profile: No results for input(s): INR, PROTIME in the last 168 hours. Cardiac  Enzymes: No results for input(s): CKTOTAL, CKMB, CKMBINDEX, TROPONINI in the last 168 hours. BNP (last 3 results) No results for input(s): PROBNP in the last 8760 hours. HbA1C: Recent Labs    09/05/24 0732  HGBA1C 6.5*   CBG: Recent Labs  Lab 09/05/24 1809 09/05/24 1946 09/06/24 0810 09/06/24 1200  GLUCAP 128* 232* 116* 145*   Lipid Profile: No results for input(s): CHOL, HDL, LDLCALC, TRIG, CHOLHDL, LDLDIRECT in the last 72 hours. Thyroid  Function Tests: No results for input(s): TSH, T4TOTAL, FREET4, T3FREE, THYROIDAB in the last 72 hours. Anemia Panel: No results for input(s): VITAMINB12, FOLATE, FERRITIN, TIBC, IRON, RETICCTPCT in the last 72 hours. Sepsis Labs: No results for input(s): PROCALCITON, LATICACIDVEN in the last 168 hours.  No results found for this or any previous visit (from the past 240 hours).       Radiology Studies: CT ABDOMEN PELVIS W CONTRAST Result Date: 09/05/2024 CLINICAL DATA:  Abdominal pain.  * Tracking Code: BO * EXAM: CT ABDOMEN AND PELVIS WITH CONTRAST TECHNIQUE: Multidetector CT imaging of the abdomen and pelvis was performed using the standard protocol following bolus administration of intravenous contrast. RADIATION DOSE REDUCTION: This exam was performed according to the departmental dose-optimization program which includes automated exposure control, adjustment of the mA and/or kV according to patient size and/or use of iterative reconstruction technique. CONTRAST:  OMNIPAQUE  IOHEXOL  300 MG/ML  SOLN COMPARISON:  12/09/2011. FINDINGS: Lower chest: Centrilobular emphysema. No acute findings. Heart is enlarged. No pericardial or pleural effusion. Distal esophagus is grossly unremarkable. Hepatobiliary: Small cyst in the dome of the liver. Liver and gallbladder are otherwise unremarkable. No biliary ductal dilatation. Pancreas: 1.5 x 1.9 cm pancreatic neck mass with upstream ductal dilatation  and gland atrophy involving the body and proximal tail. Spleen: Negative. Adrenals/Urinary Tract: Low-attenuation lesions in the kidneys. No specific follow-up necessary. Ureters are decompressed. Nodular prostate indents the bladder base. Stomach/Bowel: Proximal gastric wall appears thickened but the stomach is under distended. Appendix is fluid-filled and measures up to 8 mm (2/47). No periappendiceal inflammatory stranding or fluid. Stomach, small bowel, appendix and colon are otherwise grossly unremarkable. Vascular/Lymphatic: Atherosclerotic calcification of the aorta. No pathologically enlarged lymph nodes. Reproductive: Prostate is enlarged, indenting the bladder base. Other: No free fluid.  Mesenteries and peritoneum are unremarkable. Musculoskeletal: Osteopenia. Degenerative changes in the spine. Dextroconvex scoliosis. IMPRESSION: 1. Obstructing pancreatic neck mass is highly worrisome for adenocarcinoma. Associated upstream ductal dilatation gland atrophy. 2. Appendix is minimally dilated but is thin walled and without periappendiceal inflammatory stranding. Appendicitis is considered unlikely. 3. Gastric wall thickening. 4. Nodular prostate. 5.  Aortic atherosclerosis (ICD10-I70.0). 6.  Emphysema (ICD10-J43.9). Electronically Signed   By: Newell Eke M.D.   On: 09/05/2024 09:58        Scheduled Meds:  amLODipine   10 mg Oral Daily   aspirin   81 mg Oral Daily   budesonide -glycopyrrolate-formoterol  2 puff Inhalation BID   famotidine   20 mg Oral BID   feeding supplement  237 mL Oral BID BM   finasteride  5 mg Oral Daily   gabapentin   300 mg Oral TID   hydrALAZINE   100 mg Oral QID   lisinopril   20 mg Oral Daily   loratadine   10 mg Oral Daily   sertraline   50 mg Oral Daily   tamsulosin   0.4 mg Oral QPC supper   Continuous Infusions:   LOS: 1 day      Calvin KATHEE Robson, MD [facility]   If 7PM-7AM, please contact night-coverage  09/06/2024, 3:46 PM

## 2024-09-06 NOTE — Progress Notes (Addendum)
 [facility], Room 105 [facility] Liaison Note  Received request from Dalia Fuse, RN, Transitions of Care Manager, for hospice services at home after discharge.  Spoke with patient and niece to initiate education related to hospice philosophy, services, and team approach to care.  Patient and niece  verbalized understanding of information given.  Per discussion, the plan is for discharge home  today via niece's car.    DME needs discussed.  Patient has the following equipment in the home: walker, Elevated commode seat and a nebulizer.    Patient/family requests the following equipment in the home: None  The address has been verified and is correct in the chart.   Please send signed and completed DNR home with the patient/family.  Please provide prescriptions at discharge as needed to ensure ongoing symptom management.   [facility] information and contact numbers given to niece.   Above information shared with Dalia Fuse, RN, Transitions of Care Manager.     Please call with any Hospice related questions or concerns.  Thank you for the opportunity to participate in this patient's care.  Marinell Nova, [facility] Liaison [phone]
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
## 2024-09-06 NOTE — Care Management Obs Status (Signed)
 MEDICARE OBSERVATION STATUS NOTIFICATION   Patient Details  Name: BAIN WHICHARD MRN: [id number] Date of Birth: 1933-03-16   Medicare Observation Status Notification Given:  Yes    Dalia GORMAN Fuse, RN 09/06/2024, 3:40 PM

## 2024-09-06 NOTE — Progress Notes (Signed)
 As noted at my last note a ERCP is not going to help this patient with decreasing the abdominal pain and would need frequent change of the stents since pancreatic stents can only stay a very short time typically a week.  I have spoken to the niece who agrees that proceeding with a ERCP at this time is unwise. The patient is being followed by oncology at this time.  And there is nothing further to do for GI prophy at this time.  I will sign off.  Please call if any further GI concerns or questions.  We would like to thank you for the opportunity to participate in the care of Francisco Gibbs.

## 2024-09-06 NOTE — Consult Note (Signed)
 Palliative Medicine [facility] Telephone:([phone] [phone]) [phone]   Name: Francisco Gibbs Date: 09/06/2024 MRN: [id number]  DOB: 09-28-1933  Patient Care Team: Adina Buel HERO, MD as PCP - General (Family Medicine)    REASON FOR CONSULTATION: Francisco Gibbs is a 89 y.o. male with multiple medical problems including COPD, GERD, HFpEF, diabetes, diabetic neuropathy, aortic stenosis, who presented to the hospital with new onset chest pain/abdominal pain and weight loss.  Patient found to have a large pancreatic mass concerning for pancreatic cancer.  SOCIAL HISTORY:     reports that he has quit smoking. His smoking use included cigarettes. He has a 70 pack-year smoking history. He has been exposed to tobacco smoke. He has never used smokeless tobacco. He reports that he does not currently use alcohol. He reports that he does not use drugs.  Patient is divorced.  Lives at home alone.  He has children from whom he is estranged.  He has a sister and niece who are involved.  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  ADVANCE DIRECTIVES:  Does not have  CODE STATUS: Full code  PAST MEDICAL HISTORY: Past Medical History:  Diagnosis Date   Aortic stenosis    Basal cell carcinoma 03/16/2020   R tip of nose, R preauricular area, Moh's 06/06/2020   COPD (chronic obstructive pulmonary disease) (HCC)    Diabetes mellitus without complication (HCC)    Pt takes Metformin .   GERD (gastroesophageal reflux disease)    HLD (hyperlipidemia)    Hypertension     PAST SURGICAL HISTORY:  Past Surgical History:  Procedure Laterality Date   CAROTID PTA/STENT INTERVENTION Left 02/25/2021   Procedure: CAROTID PTA/STENT INTERVENTION;  Surgeon: Marea Selinda RAMAN, MD;  Location: [facility] INVASIVE CV LAB;  Service: Cardiovascular;  Laterality: Left;   HYDROCELE EXCISION / REPAIR     INGUINAL HERNIA REPAIR     KNEE SURGERY     LEFT HEART CATH AND CORONARY  ANGIOGRAPHY Left 01/30/2021   Procedure: LEFT HEART CATH AND CORONARY ANGIOGRAPHY;  Surgeon: Florencio Cara BIRCH, MD;  Location: [facility] INVASIVE CV LAB;  Service: Cardiovascular;  Laterality: Left;    HEMATOLOGY/ONCOLOGY HISTORY:  Oncology History   No history exists.    ALLERGIES:  is allergic to guaifenesin , penicillin g, and sulfa antibiotics.  MEDICATIONS:  Current Facility-Administered Medications  Medication Dose Route Frequency Provider Last Rate Last Admin   acetaminophen  (TYLENOL ) tablet 650 mg  650 mg Oral Q6H PRN Laurita Manor T, MD       Or   acetaminophen  (TYLENOL ) suppository 650 mg  650 mg Rectal Q6H PRN Laurita Manor T, MD       albuterol  (PROVENTIL ) (2.5 MG/3ML) 0.083% nebulizer solution 2.5 mg  2.5 mg Inhalation Q4H PRN Laurita Manor T, MD       amLODipine  (NORVASC ) tablet 10 mg  10 mg Oral Daily Laurita Manor T, MD   10 mg at 09/06/24 9146   aspirin  chewable tablet 81 mg  81 mg Oral Daily Laurita Manor T, MD   81 mg at 09/06/24 9147   budesonide -glycopyrrolate-formoterol (BREZTRI) 160-9-4.8 MCG/ACT inhaler 2 puff  2 puff Inhalation BID Laurita Manor T, MD   2 puff at 09/06/24 0844   docusate sodium  (COLACE) capsule 100 mg  100 mg Oral BID PRN Laurita Manor DASEN, MD       famotidine  (PEPCID ) tablet 20 mg  20 mg Oral BID Laurita Manor DASEN, MD   20 mg  at 09/06/24 0853   feeding supplement (ENSURE PLUS HIGH PROTEIN) liquid 237 mL  237 mL Oral BID BM Zhang, Dekui, MD       finasteride (PROSCAR) tablet 5 mg  5 mg Oral Daily Laurita Manor T, MD   5 mg at 09/06/24 9146   fluticasone  (FLONASE ) 50 MCG/ACT nasal spray 2 spray  2 spray Each Nare Daily PRN Laurita Manor DASEN, MD       gabapentin  (NEURONTIN ) capsule 300 mg  300 mg Oral TID Laurita Manor T, MD   300 mg at 09/06/24 9146   hydrALAZINE  (APRESOLINE ) tablet 100 mg  100 mg Oral QID Laurita Manor T, MD   100 mg at 09/06/24 9147   HYDROmorphone  (DILAUDID ) injection 0.5-1 mg  0.5-1 mg Intravenous Q2H PRN Laurita Manor DASEN, MD       lisinopril  (ZESTRIL ) tablet 20 mg   20 mg Oral Daily Zhang, Ping T, MD   20 mg at 09/06/24 9146   loratadine  (CLARITIN ) tablet 10 mg  10 mg Oral Daily Zhang, Ping T, MD   10 mg at 09/06/24 9146   morphine  10 MG/5ML solution 5 mg  5 mg Oral Q2H PRN Laurita Manor T, MD   5 mg at 09/05/24 2017   ondansetron  (ZOFRAN ) tablet 4 mg  4 mg Oral Q6H PRN Laurita Manor DASEN, MD       Or   ondansetron  (ZOFRAN ) injection 4 mg  4 mg Intravenous Q6H PRN Laurita Manor T, MD       polyethylene glycol (MIRALAX / GLYCOLAX) packet 17 g  17 g Oral Daily PRN Laurita Manor T, MD       sertraline  (ZOLOFT ) tablet 50 mg  50 mg Oral Daily Zhang, Ping T, MD   50 mg at 09/06/24 9146   tamsulosin  (FLOMAX ) capsule 0.4 mg  0.4 mg Oral QPC supper Laurita Manor T, MD   0.4 mg at 09/05/24 1804    VITAL SIGNS: BP (!) 117/56 (BP Location: Left Arm)   Pulse 75   Temp 98.8 F (37.1 C) (Oral)   Resp 15   Ht 5' 7 (1.702 m)   Wt 156 lb 8.4 oz (71 kg)   SpO2 93%   BMI 24.52 kg/m  Filed Weights   09/05/24 0718  Weight: 156 lb 8.4 oz (71 kg)    Estimated body mass index is 24.52 kg/m as calculated from the following:   Height as of this encounter: 5' 7 (1.702 m).   Weight as of this encounter: 156 lb 8.4 oz (71 kg).  LABS: CBC:    Component Value Date/Time   WBC 4.7 09/06/2024 0354   HGB 9.9 (L) 09/06/2024 0354   HGB 12.9 (L) 12/12/2011 1700   HCT 30.0 (L) 09/06/2024 0354   HCT 38.5 (L) 12/12/2011 1700   PLT 152 09/06/2024 0354   PLT 171 12/12/2011 1700   MCV 92.9 09/06/2024 0354   MCV 90 12/12/2011 1700   NEUTROABS 2.8 09/05/2024 0732   NEUTROABS 3.6 12/12/2011 1700   LYMPHSABS 0.9 09/05/2024 0732   LYMPHSABS 1.6 12/12/2011 1700   MONOABS 0.3 09/05/2024 0732   MONOABS 0.4 12/12/2011 1700   EOSABS 0.0 09/05/2024 0732   EOSABS 0.0 12/12/2011 1700   BASOSABS 0.0 09/05/2024 0732   BASOSABS 0.0 12/12/2011 1700   Comprehensive Metabolic Panel:    Component Value Date/Time   NA 136 09/05/2024 0732   NA 140 12/12/2011 1700   K 5.1 09/05/2024 0732   K 4.4  12/12/2011 1700  CL 103 09/05/2024 0732   CL 103 12/12/2011 1700   CO2 19 (L) 09/05/2024 0732   CO2 29 12/12/2011 1700   BUN 33 (H) 09/05/2024 0732   BUN 17 12/12/2011 1700   CREATININE 1.35 (H) 09/05/2024 0732   CREATININE 0.80 12/12/2011 1700   GLUCOSE 162 (H) 09/05/2024 0732   GLUCOSE 134 (H) 12/12/2011 1700   CALCIUM  9.8 09/05/2024 0732   CALCIUM  9.7 12/12/2011 1700   AST 16 09/06/2024 0354   AST 32 12/09/2011 1223   ALT 11 09/06/2024 0354   ALT 33 12/09/2011 1223   ALKPHOS 39 09/06/2024 0354   ALKPHOS 61 12/09/2011 1223   BILITOT 0.5 09/06/2024 0354   BILITOT 0.3 12/09/2011 1223   PROT 6.2 (L) 09/06/2024 0354   PROT 7.4 12/09/2011 1223   ALBUMIN 3.7 09/06/2024 0354   ALBUMIN 4.2 12/09/2011 1223    RADIOGRAPHIC STUDIES: CT ABDOMEN PELVIS W CONTRAST Result Date: 09/05/2024 CLINICAL DATA:  Abdominal pain.  * Tracking Code: BO * EXAM: CT ABDOMEN AND PELVIS WITH CONTRAST TECHNIQUE: Multidetector CT imaging of the abdomen and pelvis was performed using the standard protocol following bolus administration of intravenous contrast. RADIATION DOSE REDUCTION: This exam was performed according to the departmental dose-optimization program which includes automated exposure control, adjustment of the mA and/or kV according to patient size and/or use of iterative reconstruction technique. CONTRAST:  OMNIPAQUE  IOHEXOL  300 MG/ML  SOLN COMPARISON:  12/09/2011. FINDINGS: Lower chest: Centrilobular emphysema. No acute findings. Heart is enlarged. No pericardial or pleural effusion. Distal esophagus is grossly unremarkable. Hepatobiliary: Small cyst in the dome of the liver. Liver and gallbladder are otherwise unremarkable. No biliary ductal dilatation. Pancreas: 1.5 x 1.9 cm pancreatic neck mass with upstream ductal dilatation and gland atrophy involving the body and proximal tail. Spleen: Negative. Adrenals/Urinary Tract: Low-attenuation lesions in the kidneys. No specific follow-up necessary.  Ureters are decompressed. Nodular prostate indents the bladder base. Stomach/Bowel: Proximal gastric wall appears thickened but the stomach is under distended. Appendix is fluid-filled and measures up to 8 mm (2/47). No periappendiceal inflammatory stranding or fluid. Stomach, small bowel, appendix and colon are otherwise grossly unremarkable. Vascular/Lymphatic: Atherosclerotic calcification of the aorta. No pathologically enlarged lymph nodes. Reproductive: Prostate is enlarged, indenting the bladder base. Other: No free fluid.  Mesenteries and peritoneum are unremarkable. Musculoskeletal: Osteopenia. Degenerative changes in the spine. Dextroconvex scoliosis. IMPRESSION: 1. Obstructing pancreatic neck mass is highly worrisome for adenocarcinoma. Associated upstream ductal dilatation gland atrophy. 2. Appendix is minimally dilated but is thin walled and without periappendiceal inflammatory stranding. Appendicitis is considered unlikely. 3. Gastric wall thickening. 4. Nodular prostate. 5.  Aortic atherosclerosis (ICD10-I70.0). 6.  Emphysema (ICD10-J43.9). Electronically Signed   By: Newell Eke M.D.   On: 09/05/2024 09:58    PERFORMANCE STATUS (ECOG) : 2 - Symptomatic, <50% confined to bed  Review of Systems Unless otherwise noted, a complete review of systems is negative.  Physical Exam General: NAD Pulmonary: Unlabored Extremities: no edema, no joint deformities Skin: no rashes Neurological: Weakness but otherwise nonfocal  IMPRESSION: Patient admitted to hospital with abdominal pain, weight loss, found to have pancreatic mass concerning for primary pancreatic cancer.  CT of the abdomen and pelvis showed obstructing pancreatic neck mass with associated ductal dilation but no evidence of distant metastasis.  CEA and CA 19-9 pending.  Patient was evaluated by GI and is not felt to be a candidate for ERCP.  I met with patient but communication was challenged by his severe hearing deficit and  semi-private room setting.  Patient tells me that he recognizes that he likely has cancer and that prognosis is poor.  He reports being told that he has 6 months or less to live.  However, patient reports having a strong faith in God and believes in miracles.  He plans to return home at time of discharge.  He was living alone and was independent with his own care.  He says that he has a sister and niece who are involved.  Patient does not have advanced directives.  Briefly discussed the option of hospice as patient is unlikely to be a candidate for treatment, even if he was able to pursue tissue diagnosis.  Patient says he is familiar with hospice as he has had siblings die under hospice care.  His initial impression of hospice was negative but he did agree that I could speak with his family to discuss in more detail.  I spoke with his niece, Olam.  She had previously discussed the case with Dr. Jinny and verbalized understanding the patient was not felt to be a good candidate for ERCP.  Similarly, we discussed his poor candidacy for cancer treatment.  She says she recognizes the patient is likely nearing end-of-life with an overall poor prognosis.  She would be interested in hospice following at time of discharge.  However, she says that she has high suspicion that patient will agree to care so that he can get out of the hospital but then will refuse hospice services at home.  She says that he has done this in the past with home health.  She describes patient as being intensely stubborn and pride full and is fearful that if clinician see his home they will condemn it and he will be placed in a facility.   We also discussed CODE STATUS I strongly recommend DNR/DNI.  Niece verbalized agreement but says that she will have to talk to patient about these decisions.  PLAN: - Continue current scope of treatment - Recommend best supportive care  Case and plan discussed with Dr. Babara and Dr.  Jhonny  Time Total: 30 minutes  Visit consisted of counseling and education dealing with the complex and emotionally intense issues of symptom management and palliative care in the setting of serious and potentially life-threatening illness.Greater than 50%  of this time was spent counseling and coordinating care related to the above assessment and plan.  Signed by: Fonda Mower, PhD, NP-C

## 2024-09-07 LAB — CEA: CEA: 1.3 ng/mL (ref 0.0–4.7)

## 2024-09-07 LAB — CANCER ANTIGEN 19-9: CA 19-9: 23 U/mL (ref 0–35)

## 2024-10-06 ENCOUNTER — Emergency Department

## 2024-10-06 ENCOUNTER — Inpatient Hospital Stay
Admission: EM | Admit: 2024-10-06 | Discharge: 2024-10-07 | DRG: 951 | Disposition: A | Discharge status: Hospice | Source: Hospice | Attending: Internal Medicine | Admitting: Internal Medicine

## 2024-10-06 ENCOUNTER — Other Ambulatory Visit: Payer: Self-pay

## 2024-10-06 DIAGNOSIS — Z880 Allergy status to penicillin: Secondary | ICD-10-CM | POA: Diagnosis not present

## 2024-10-06 DIAGNOSIS — Z888 Allergy status to other drugs, medicaments and biological substances status: Secondary | ICD-10-CM | POA: Diagnosis not present

## 2024-10-06 DIAGNOSIS — I130 Hypertensive heart and chronic kidney disease with heart failure and stage 1 through stage 4 chronic kidney disease, or unspecified chronic kidney disease: Secondary | ICD-10-CM | POA: Diagnosis present

## 2024-10-06 DIAGNOSIS — Z79899 Other long term (current) drug therapy: Secondary | ICD-10-CM

## 2024-10-06 DIAGNOSIS — Z833 Family history of diabetes mellitus: Secondary | ICD-10-CM

## 2024-10-06 DIAGNOSIS — D649 Anemia, unspecified: Secondary | ICD-10-CM | POA: Diagnosis present

## 2024-10-06 DIAGNOSIS — Z85828 Personal history of other malignant neoplasm of skin: Secondary | ICD-10-CM | POA: Diagnosis not present

## 2024-10-06 DIAGNOSIS — R569 Unspecified convulsions: Secondary | ICD-10-CM | POA: Diagnosis present

## 2024-10-06 DIAGNOSIS — E785 Hyperlipidemia, unspecified: Secondary | ICD-10-CM | POA: Diagnosis present

## 2024-10-06 DIAGNOSIS — Z87891 Personal history of nicotine dependence: Secondary | ICD-10-CM | POA: Diagnosis not present

## 2024-10-06 DIAGNOSIS — Z95828 Presence of other vascular implants and grafts: Secondary | ICD-10-CM

## 2024-10-06 DIAGNOSIS — I350 Nonrheumatic aortic (valve) stenosis: Secondary | ICD-10-CM | POA: Diagnosis present

## 2024-10-06 DIAGNOSIS — Z515 Encounter for palliative care: Principal | ICD-10-CM

## 2024-10-06 DIAGNOSIS — K219 Gastro-esophageal reflux disease without esophagitis: Secondary | ICD-10-CM | POA: Diagnosis present

## 2024-10-06 DIAGNOSIS — Z66 Do not resuscitate: Secondary | ICD-10-CM | POA: Diagnosis present

## 2024-10-06 DIAGNOSIS — E119 Type 2 diabetes mellitus without complications: Secondary | ICD-10-CM

## 2024-10-06 DIAGNOSIS — G9340 Encephalopathy, unspecified: Secondary | ICD-10-CM | POA: Diagnosis present

## 2024-10-06 DIAGNOSIS — Z882 Allergy status to sulfonamides status: Secondary | ICD-10-CM | POA: Diagnosis not present

## 2024-10-06 DIAGNOSIS — N1832 Chronic kidney disease, stage 3b: Secondary | ICD-10-CM | POA: Diagnosis present

## 2024-10-06 DIAGNOSIS — Z602 Problems related to living alone: Secondary | ICD-10-CM | POA: Diagnosis present

## 2024-10-06 DIAGNOSIS — E872 Acidosis, unspecified: Secondary | ICD-10-CM | POA: Diagnosis present

## 2024-10-06 DIAGNOSIS — Z7984 Long term (current) use of oral hypoglycemic drugs: Secondary | ICD-10-CM

## 2024-10-06 DIAGNOSIS — Z7951 Long term (current) use of inhaled steroids: Secondary | ICD-10-CM

## 2024-10-06 DIAGNOSIS — J449 Chronic obstructive pulmonary disease, unspecified: Secondary | ICD-10-CM | POA: Diagnosis present

## 2024-10-06 DIAGNOSIS — C259 Malignant neoplasm of pancreas, unspecified: Secondary | ICD-10-CM | POA: Diagnosis present

## 2024-10-06 DIAGNOSIS — E1122 Type 2 diabetes mellitus with diabetic chronic kidney disease: Secondary | ICD-10-CM | POA: Diagnosis present

## 2024-10-06 DIAGNOSIS — Z7982 Long term (current) use of aspirin: Secondary | ICD-10-CM

## 2024-10-06 LAB — URINALYSIS, W/ REFLEX TO CULTURE (INFECTION SUSPECTED)
Bacteria, UA: NONE SEEN
Bilirubin Urine: NEGATIVE
Glucose, UA: NEGATIVE mg/dL
Ketones, ur: 5 mg/dL — AB
Leukocytes,Ua: NEGATIVE
Nitrite: NEGATIVE
Protein, ur: 30 mg/dL — AB
Specific Gravity, Urine: 1.017 (ref 1.005–1.030)
Squamous Epithelial / HPF: 0 /HPF (ref 0–5)
pH: 5 (ref 5.0–8.0)

## 2024-10-06 LAB — CBC WITH DIFFERENTIAL/PLATELET
Abs Immature Granulocytes: 0.01 K/uL (ref 0.00–0.07)
Basophils Absolute: 0 K/uL (ref 0.0–0.1)
Basophils Relative: 1 %
Eosinophils Absolute: 0 K/uL (ref 0.0–0.5)
Eosinophils Relative: 0 %
HCT: 36.2 % — ABNORMAL LOW (ref 39.0–52.0)
Hemoglobin: 11.5 g/dL — ABNORMAL LOW (ref 13.0–17.0)
Immature Granulocytes: 0 %
Lymphocytes Relative: 21 %
Lymphs Abs: 1.2 K/uL (ref 0.7–4.0)
MCH: 29.6 pg (ref 26.0–34.0)
MCHC: 31.8 g/dL (ref 30.0–36.0)
MCV: 93.3 fL (ref 80.0–100.0)
Monocytes Absolute: 0.6 K/uL (ref 0.1–1.0)
Monocytes Relative: 11 %
Neutro Abs: 4 K/uL (ref 1.7–7.7)
Neutrophils Relative %: 67 %
Platelets: 178 K/uL (ref 150–400)
RBC: 3.88 MIL/uL — ABNORMAL LOW (ref 4.22–5.81)
RDW: 14 % (ref 11.5–15.5)
WBC: 5.8 K/uL (ref 4.0–10.5)
nRBC: 0 % (ref 0.0–0.2)

## 2024-10-06 LAB — COMPREHENSIVE METABOLIC PANEL WITH GFR
ALT: 11 U/L (ref 0–44)
AST: 22 U/L (ref 15–41)
Albumin: 4.3 g/dL (ref 3.5–5.0)
Alkaline Phosphatase: 53 U/L (ref 38–126)
Anion gap: 14 (ref 5–15)
BUN: 36 mg/dL — ABNORMAL HIGH (ref 8–23)
CO2: 19 mmol/L — ABNORMAL LOW (ref 22–32)
Calcium: 9.9 mg/dL (ref 8.9–10.3)
Chloride: 105 mmol/L (ref 98–111)
Creatinine, Ser: 1.64 mg/dL — ABNORMAL HIGH (ref 0.61–1.24)
GFR, Estimated: 40 mL/min — ABNORMAL LOW (ref >60)
Glucose, Bld: 195 mg/dL — ABNORMAL HIGH (ref 70–99)
Potassium: 4.8 mmol/L (ref 3.5–5.1)
Sodium: 138 mmol/L (ref 135–145)
Total Bilirubin: 0.9 mg/dL (ref 0.0–1.2)
Total Protein: 7.2 g/dL (ref 6.5–8.1)

## 2024-10-06 LAB — URINE DRUG SCREEN, QUALITATIVE (ARMC ONLY)
Amphetamines, Ur Screen: NOT DETECTED
Barbiturates, Ur Screen: NOT DETECTED
Benzodiazepine, Ur Scrn: POSITIVE — AB
Cannabinoid 50 Ng, Ur ~~LOC~~: NOT DETECTED
Cocaine Metabolite,Ur ~~LOC~~: NOT DETECTED
MDMA (Ecstasy)Ur Screen: NOT DETECTED
Methadone Scn, Ur: NOT DETECTED
Opiate, Ur Screen: NOT DETECTED
Phencyclidine (PCP) Ur S: NOT DETECTED
Tricyclic, Ur Screen: NOT DETECTED

## 2024-10-06 LAB — AMMONIA: Ammonia: 17 umol/L (ref 9–35)

## 2024-10-06 MED ORDER — ONDANSETRON HCL 4 MG/2ML IJ SOLN: 4.0000 mg | Freq: Four times a day (QID) | INTRAMUSCULAR | Status: DC | PRN

## 2024-10-06 MED ORDER — ZIPRASIDONE MESYLATE 20 MG IM SOLR
20.0000 mg | Freq: Once | INTRAMUSCULAR | Status: AC
  Administered 2024-10-06: 20 mg via INTRAMUSCULAR
  Filled 2024-10-06: qty 20

## 2024-10-06 MED ORDER — SODIUM CHLORIDE 0.9 % IV SOLN: INTRAVENOUS | Status: DC

## 2024-10-06 MED ORDER — FENTANYL CITRATE (PF) 50 MCG/ML IJ SOSY: 25.0000 ug | PREFILLED_SYRINGE | INTRAMUSCULAR | Status: DC | PRN

## 2024-10-06 MED ORDER — ACETAMINOPHEN 325 MG PO TABS: 650.0000 mg | ORAL_TABLET | Freq: Four times a day (QID) | ORAL | Status: DC | PRN

## 2024-10-06 MED ORDER — ONDANSETRON 4 MG PO TBDP: 4.0000 mg | ORAL_TABLET | Freq: Four times a day (QID) | ORAL | Status: DC | PRN

## 2024-10-06 MED ORDER — MORPHINE SULFATE (PF) 4 MG/ML IV SOLN
4.0000 mg | Freq: Once | INTRAVENOUS | Status: AC
  Administered 2024-10-06: 4 mg via INTRAVENOUS
  Filled 2024-10-06: qty 1

## 2024-10-06 MED ORDER — GLYCOPYRROLATE 0.2 MG/ML IJ SOLN
0.2000 mg | INTRAMUSCULAR | Status: DC | PRN
  Administered 2024-10-07: 0.2 mg via INTRAVENOUS
  Filled 2024-10-06: qty 1

## 2024-10-06 MED ORDER — HALOPERIDOL LACTATE 2 MG/ML PO CONC: 0.5000 mg | ORAL | Status: DC | PRN

## 2024-10-06 MED ORDER — POLYVINYL ALCOHOL 1.4 % OP SOLN: 1.0000 [drp] | Freq: Four times a day (QID) | OPHTHALMIC | Status: DC | PRN

## 2024-10-06 MED ORDER — ACETAMINOPHEN 650 MG RE SUPP: 650.0000 mg | Freq: Four times a day (QID) | RECTAL | Status: DC | PRN

## 2024-10-06 MED ORDER — GLYCOPYRROLATE 0.2 MG/ML IJ SOLN: 0.2000 mg | INTRAMUSCULAR | Status: DC | PRN

## 2024-10-06 MED ORDER — LORAZEPAM 2 MG/ML IJ SOLN: 2.0000 mg | Freq: Once | INTRAMUSCULAR | Status: DC | PRN

## 2024-10-06 MED ORDER — SODIUM CHLORIDE 0.9% FLUSH
3.0000 mL | Freq: Two times a day (BID) | INTRAVENOUS | Status: DC
  Administered 2024-10-06 – 2024-10-07 (×2): 3 mL via INTRAVENOUS

## 2024-10-06 MED ORDER — DIPHENHYDRAMINE HCL 50 MG/ML IJ SOLN: 12.5000 mg | INTRAMUSCULAR | Status: DC | PRN

## 2024-10-06 MED ORDER — MORPHINE SULFATE (PF) 2 MG/ML IV SOLN
1.0000 mg | INTRAVENOUS | Status: DC | PRN
  Administered 2024-10-07: 2 mg via INTRAVENOUS
  Filled 2024-10-06: qty 1

## 2024-10-06 MED ORDER — HALOPERIDOL LACTATE 5 MG/ML IJ SOLN: 5.0000 mg | Freq: Once | INTRAMUSCULAR | Status: AC

## 2024-10-06 MED ORDER — LORAZEPAM 2 MG/ML IJ SOLN
2.0000 mg | INTRAMUSCULAR | Status: DC | PRN
  Administered 2024-10-06 – 2024-10-07 (×2): 2 mg via INTRAVENOUS
  Administered 2024-10-07: 4 mg via INTRAVENOUS
  Filled 2024-10-06 (×2): qty 1
  Filled 2024-10-06: qty 2

## 2024-10-06 MED ORDER — HALOPERIDOL LACTATE 5 MG/ML IJ SOLN
INTRAMUSCULAR | Status: AC
  Administered 2024-10-06: 5 mg via INTRAVENOUS
  Filled 2024-10-06: qty 1

## 2024-10-06 MED ORDER — GLYCOPYRROLATE 1 MG PO TABS: 1.0000 mg | ORAL_TABLET | ORAL | Status: DC | PRN

## 2024-10-06 MED ORDER — SODIUM CHLORIDE 0.9 % IV BOLUS
500.0000 mL | Freq: Once | INTRAVENOUS | Status: AC
  Administered 2024-10-06: 500 mL via INTRAVENOUS

## 2024-10-06 MED ORDER — HALOPERIDOL 0.5 MG PO TABS: 0.5000 mg | ORAL_TABLET | ORAL | Status: DC | PRN

## 2024-10-06 MED ORDER — HALOPERIDOL LACTATE 5 MG/ML IJ SOLN
0.5000 mg | INTRAMUSCULAR | Status: DC | PRN
  Administered 2024-10-07: 0.5 mg via INTRAVENOUS
  Filled 2024-10-06: qty 1

## 2024-10-06 NOTE — Progress Notes (Addendum)
 [facility] Room [facility] Liaison Note  This patient is currently followed by [facility].  [facility] will follow through discharge disposition.  Please call with any hospice related questions or concerns.  [facility] Liaison [phone]

## 2024-10-06 NOTE — ED Triage Notes (Signed)
 Patient to ED via [facility] from home. Patient is currently on home hospice for prostate cancer. Patient lives at home alone and is A&O4 at baseline. Patient's niece noted patient to be completely altered yesterday. Patient was refusing meds at home today with home hospice and was more altered so hospice called EMS. Patient is currently combative with staff and disoriented. Patient received 5 mg versed  and 5mg  haldol in route.

## 2024-10-06 NOTE — ED Provider Notes (Signed)
 [facility] Provider Note    Event Date/Time   First MD Initiated Contact with Patient 10/06/24 1544     (approximate)   History   Altered Mental Status   HPI  Francisco Gibbs is a 89 year old male with history of COPD, hypertension, CHF, T2DM, aortic stenosis presenting to the emergency department for evaluation of altered mental status.  Patient has a history of pancreatic cancer on hospice.  Normally awake, alert, driving on his own and lives by himself.  Yesterday noted to have some confusion by family.  Today family and hospice nurse noted that patient significantly more confused, sent to the ER in the setting of this.  With EMS, patient was agitated, not able to be directed.  He received 5 mg of Haldol, 2.5 mg of Versed  x 2 but remained significantly agitated on presentation.  Unable to provide history.  Does arrive with DNR form.  Reviewed discharge summary from 09/06/2024.  At that time patient presented with epigastric pain, weight loss.  He was found to have imaging concerning for pancreatic cancer.  He was found to be a poor treatment candidate so he was discharged with outpatient hospice.      Physical Exam   Triage Vital Signs: ED Triage Vitals [10/06/24 1541]  Encounter Vitals Group     BP      Girls Systolic BP Percentile      Girls Diastolic BP Percentile      Boys Systolic BP Percentile      Boys Diastolic BP Percentile      Pulse      Resp      Temp      Temp src      SpO2      Weight 156 lb 8.4 oz (71 kg)     Height 5' 8 (1.727 m)     Head Circumference      Peak Flow      Pain Score      Pain Loc      Pain Education      Exclude from Growth Chart     Most recent vital signs: Vitals:   10/06/24 1604 10/06/24 2119  BP: (!) 176/76 (!) 147/66  Pulse: (!) 110 75  Resp: 18 18  Temp: 98.5 F (36.9 C) 97.7 F (36.5 C)  SpO2: 99% 99%     General: Agitated, unable to be redirected CV:  Good peripheral  perfusion Resp:  Unlabored respirations Abd:  Nondistended.  Neuro:  No gross facial asymmetry, moving extremity spontaneously, not following commands   ED Results / Procedures / Treatments   Labs (all labs ordered are listed, but only abnormal results are displayed) Labs Reviewed  CBC WITH DIFFERENTIAL/PLATELET - Abnormal; Notable for the following components:      Result Value   RBC 3.88 (*)    Hemoglobin 11.5 (*)    HCT 36.2 (*)    All other components within normal limits  COMPREHENSIVE METABOLIC PANEL WITH GFR - Abnormal; Notable for the following components:   CO2 19 (*)    Glucose, Bld 195 (*)    BUN 36 (*)    Creatinine, Ser 1.64 (*)    GFR, Estimated 40 (*)    All other components within normal limits  URINALYSIS, W/ REFLEX TO CULTURE (INFECTION SUSPECTED) - Abnormal; Notable for the following components:   Color, Urine YELLOW (*)    APPearance CLEAR (*)    Hgb urine dipstick SMALL (*)  Ketones, ur 5 (*)    Protein, ur 30 (*)    All other components within normal limits  URINE DRUG SCREEN, QUALITATIVE ([facility] ONLY) - Abnormal; Notable for the following components:   Benzodiazepine, Ur Scrn POSITIVE (*)    All other components within normal limits  AMMONIA     EKG EKG independently reviewed and interpreted by myself demonstrates:    RADIOLOGY Imaging independently reviewed and interpreted by myself demonstrates:  CXR with questionable consolidation  Formal Radiology Read:  DG Chest Portable 1 View Result Date: 10/06/2024 CLINICAL DATA:  Altered mental status. EXAM: PORTABLE CHEST 1 VIEW COMPARISON:  Chest radiograph dated 11/02/2023. FINDINGS: Bilateral lower lung field reticulonodular densities may represent atelectasis or atypical infiltrate. No focal consolidation, pleural effusion, pneumothorax. The cardiac silhouette is within normal limits. Atherosclerotic calcification of the aorta. No acute osseous pathology. IMPRESSION: Bilateral lower lung field  atelectasis or atypical infiltrate. Electronically Signed   By: Vanetta Chou M.D.   On: 10/06/2024 16:43    PROCEDURES:  Critical Care performed: Yes, see critical care procedure note(s)  CRITICAL CARE Performed by: Nilsa Dade   Total critical care time: 31 minutes  Critical care time was exclusive of separately billable procedures and treating other patients.  Critical care was necessary to treat or prevent imminent or life-threatening deterioration.  Critical care was time spent personally by me on the following activities: development of treatment plan with patient and/or surrogate as well as nursing, discussions with consultants, evaluation of patient's response to treatment, examination of patient, obtaining history from patient or surrogate, ordering and performing treatments and interventions, ordering and review of laboratory studies, ordering and review of radiographic studies, pulse oximetry and re-evaluation of patient's condition.   Procedures   MEDICATIONS ORDERED IN ED: Medications  0.9 %  sodium chloride  infusion ( Intravenous New Bag/Given 10/06/24 1803)  acetaminophen  (TYLENOL ) tablet 650 mg (has no administration in time range)    Or  acetaminophen  (TYLENOL ) suppository 650 mg (has no administration in time range)  glycopyrrolate (ROBINUL) tablet 1 mg (has no administration in time range)    Or  glycopyrrolate (ROBINUL) injection 0.2 mg (has no administration in time range)    Or  glycopyrrolate (ROBINUL) injection 0.2 mg (has no administration in time range)  artificial tears ophthalmic solution 1 drop (has no administration in time range)  fentaNYL  (SUBLIMAZE ) injection 25-100 mcg (has no administration in time range)  LORazepam (ATIVAN) injection 2-4 mg (2 mg Intravenous Given 10/06/24 1804)  ondansetron  (ZOFRAN -ODT) disintegrating tablet 4 mg (has no administration in time range)    Or  ondansetron  (ZOFRAN ) injection 4 mg (has no administration in time  range)  sodium chloride  flush (NS) 0.9 % injection 3 mL (3 mLs Intravenous Given 10/06/24 2222)  morphine  (PF) 2 MG/ML injection 1-4 mg (has no administration in time range)  haloperidol (HALDOL) tablet 0.5 mg (has no administration in time range)    Or  haloperidol (HALDOL) 2 MG/ML solution 0.5 mg (has no administration in time range)    Or  haloperidol lactate (HALDOL) injection 0.5 mg (has no administration in time range)  diphenhydrAMINE  (BENADRYL ) injection 12.5 mg (has no administration in time range)  haloperidol lactate (HALDOL) injection 5 mg (5 mg Intravenous Given 10/06/24 1540)  ziprasidone (GEODON) injection 20 mg (20 mg Intramuscular Given 10/06/24 1546)  sodium chloride  0.9 % bolus 500 mL (0 mLs Intravenous Stopped 10/06/24 1705)  morphine  (PF) 4 MG/ML injection 4 mg (4 mg Intravenous Given 10/06/24 1803)  IMPRESSION / MDM / ASSESSMENT AND PLAN / ED COURSE  I reviewed the triage vital signs and the nursing notes.  Differential diagnosis includes, but is not limited to, intracranial bleed, toxic metabolic process, infection, progressive malignancy  Patient's presentation is most consistent with acute presentation with potential threat to life or bodily function.  89 year old male on hospice presenting with acute encephalopathy.  Extensive initial differential.  Patient acutely agitated on presentation here.  Received Haldol and Versed  with EMS, remains agitated.  Given 5 mg of Haldol with limited improvement.  Given 20 mg of Geodon with some improvement, remained too agitated to obtain CTs.  Family member present at bedside, CTs canceled as below.  Comfort care order set initiated as below.  Reviewed with hospitalist team.  They will evaluate for anticipated admission.  Clinical Course as of 10/06/24 2355  Thu Oct 06, 2024  1721 Spoke with patient's niece who reports she is the patient's management consultant. Confirms that patient is DNR, on hospice, and notes that she would like to  only pursue comfort measures.  She does not wish to proceed with CT scans.  Would like to discontinue any monitoring that is disruptive to patient.  BP cuff removed.  Ordered for morphine .  As needed Ativan ordered for agitation, family understands risks of oversedation, but are they understandably would like to proceed given overall goals of care. [NR]  1728 Spoke with Francisco Gibbs, hospice nurse with [facility]. The patient is unsafe to discharge home, she does recommend admission to the hospital with comfort care order set in place.  The hospital liaison will see the patient tomorrow to assess determine longer-term plan for the patient.  RN updated.  Family updated.  Will place comfort care order set. [NR]    Clinical Course User Index [NR] Levander Slate, MD     FINAL CLINICAL IMPRESSION(S) / ED DIAGNOSES   Final diagnoses:  Acute encephalopathy     Rx / DC Orders   ED Discharge Orders     None        Note:  This document was prepared using Dragon voice recognition software and may include unintentional dictation errors.   Levander Slate, MD 10/06/24 [phone]

## 2024-10-06 NOTE — ED Notes (Signed)
 Pt placed on 2L [place] due to pt desatting in upper 80's with adequate pleth. Pt current saturation @ 100% on 2L. 2 warm blankets given. Niece at bedside.

## 2024-10-06 NOTE — H&P (Signed)
 History and Physical    Francisco Gibbs FMW:[id number] DOB: 1933-11-25 DOA: 10/06/2024  DOS: the patient was seen and examined on 10/06/2024  PCP: Adina Buel HERO, MD   Patient coming from: Home  I have personally briefly reviewed patient's old medical records in [facility] and CareEverywhere  HPI:   Francisco Gibbs is a 89 y.o. year old male with medical history of hypertension, hyperlipidemia, type 2 diabetes, recent diagnosis of pancreatic cancer and under hospice care given poor treatment candidate presenting to the ED for confusion.   Pt sedated and unable to provide any history.  Per niece who is at bedside states patient wanted an at home death.  He has been doing okay since his discharge with hospice.  Since did not have support at home given his encephalopathy they brought him to the ED for more support.  Patient is only close relatives are his sister and niece.  They state they are the decision-makers for him.   On arrival to the ED patient was noted to be HDS stable.  Lab work and imaging obtained.  CBC shows mild anemia without any leukocytosis, CMP shows NAGMA, baseline renal function, no ammonia level was checked and normal, urinalysis checked and normal.  Chest x-ray without any acute findings.  Patient is a hospice patient.  EDP discussed with niece who is at bedside regarding head imaging but she refused stating that she does not want to pursue diagnostic imaging and keep in line with hospice care.  EDP also discussed with hospice nurse who recommends inpatient admission with eventual transition to hospice facility.  Given this, [facility] contacted for admission.  Review of Systems: As mentioned in the history of present illness. All other systems reviewed and are negative.   Past Medical History:  Diagnosis Date   Aortic stenosis    Basal cell carcinoma 03/16/2020   R tip of nose, R preauricular area, Moh's 06/06/2020   COPD (chronic obstructive pulmonary disease) (HCC)     Diabetes mellitus without complication (HCC)    Pt takes Metformin .   GERD (gastroesophageal reflux disease)    HLD (hyperlipidemia)    Hypertension     Past Surgical History:  Procedure Laterality Date   CAROTID PTA/STENT INTERVENTION Left 02/25/2021   Procedure: CAROTID PTA/STENT INTERVENTION;  Surgeon: Marea Selinda RAMAN, MD;  Location: [facility] INVASIVE CV LAB;  Service: Cardiovascular;  Laterality: Left;   HYDROCELE EXCISION / REPAIR     INGUINAL HERNIA REPAIR     KNEE SURGERY     LEFT HEART CATH AND CORONARY ANGIOGRAPHY Left 01/30/2021   Procedure: LEFT HEART CATH AND CORONARY ANGIOGRAPHY;  Surgeon: Florencio Cara BIRCH, MD;  Location: [facility] INVASIVE CV LAB;  Service: Cardiovascular;  Laterality: Left;     Allergies  Allergen Reactions   Guaifenesin  Shortness Of Breath    Other reaction(s): Other (See Comments)   Penicillin G Rash and Shortness Of Breath    Has patient had a PCN reaction causing immediate rash, facial/tongue/throat swelling, SOB or lightheadedness with hypotension: Yes Has patient had a PCN reaction causing severe rash involving mucus membranes or skin necrosis: No Has patient had a PCN reaction that required hospitalization: No Has patient had a PCN reaction occurring within the last 10 years: No If all of the above answers are NO, then may proceed with Cephalosporin use. Has patient had a PCN reaction causing immediate rash, facial/tongue/throat swelling, SOB or lightheadedness with hypotension: Yes Has patient had a PCN reaction causing severe rash  involving mucus membranes or skin necrosis: No Has patient had a PCN reaction that required hospitalization: No Has patient had a PCN reaction occurring within the last 10 years: No If all of the above answers are NO, then may proceed with Cephalosporin use. Has patient had a PCN reaction causing immediate rash, facial/tongue/throat swelling, SOB or lightheadedness with hypotension: Yes Has patient had a PCN reaction  causing severe rash involving mucus membranes or skin necrosis: No Has patient had a PCN reaction that required hospitalization: No Has patient had a PCN reaction occurring within the last 10 years: No If all of the above answers are NO, then may proceed with Cephalosporin use. Has patient had a PCN reaction causing immediate rash, facial/tongue/throat swelling, SOB or lightheadedness with hypotension: Yes Has patient had a PCN reaction causing severe rash involving mucus membranes or skin necrosis: No Has patient had a PCN reaction that required hospitalization: No Has patient had a PCN reaction occurring within the last 10 years: NoIf all of the above answers are NO, then may proceed with Cephalosporin use. Has patient had a PCN reaction causing immediate rash, facial/tongue/throat swelling, SOB or lightheadedness with hypotension: Yes Has patient had a PCN reaction causing se... (TRUNCATED)   Sulfa Antibiotics Shortness Of Breath    Family History  Problem Relation Age of Onset   Diabetes Sister    Diabetes Paternal Aunt     Prior to Admission medications   Medication Sig Start Date End Date Taking? Authorizing Provider  albuterol  (PROVENTIL  HFA;VENTOLIN  HFA) 108 (90 Base) MCG/ACT inhaler Inhale 2 puffs into the lungs every 4 (four) hours as needed for wheezing or shortness of breath.    [provider]  amLODipine  (NORVASC ) 10 MG tablet Take 10 mg by mouth daily.    [provider]  aspirin  81 MG chewable tablet Chew 81 mg by mouth daily.     [provider]  cetirizine (ZYRTEC) 10 MG tablet Take 10 mg by mouth every evening.     [provider]  famotidine  (PEPCID ) 20 MG tablet Take 20 mg by mouth 2 (two) times daily.    [provider]  FEROSUL 325 (65 Fe) MG tablet Take 325 mg by mouth 2 (two) times daily. 06/15/22   [provider]  finasteride (PROSCAR) 5 MG tablet Take 5 mg by mouth daily. 08/19/24   [provider]   fluticasone  (FLONASE ) 50 MCG/ACT nasal spray Place 2 sprays into the nose every evening.     [provider]  Fluticasone -Umeclidin-Vilant (TRELEGY ELLIPTA) 100-62.5-25 MCG/INH AEPB Inhale 1 puff into the lungs daily. 11/08/19   [provider]  gabapentin  (NEURONTIN ) 300 MG capsule Take 300 mg by mouth 3 (three) times daily.     [provider]  hydrALAZINE  (APRESOLINE ) 100 MG tablet Take 100 mg by mouth 4 (four) times daily.    [provider]  lisinopril  (ZESTRIL ) 20 MG tablet Take 20 mg by mouth daily. 09/28/23 09/27/24  [provider]  magnesium  oxide (MAG-OX) 400 (241.3 Mg) MG tablet Take 1 tablet (400 mg total) by mouth daily. 04/29/18   Almeda Bernard, MD  metFORMIN  (GLUCOPHAGE ) 500 MG tablet Take 1,000 mg by mouth 2 (two) times daily. 07/04/22   [provider]  Multiple Vitamins-Minerals (MULTIVITAMIN WITH MINERALS) tablet Take 1 tablet by mouth daily.    [provider]  oxyCODONE  (ROXICODONE ) 5 MG immediate release tablet Take 1 tablet (5 mg total) by mouth every 8 (eight) hours as needed. 09/06/24  09/06/25  Jhonny Calvin NOVAK, MD  sertraline  (ZOLOFT ) 25 MG tablet Take 50 mg by mouth daily. 12/26/20   [provider]  SPS, SODIUM POLYSTYRENE SULF, 15 GM/60ML suspension Take 15 g by mouth. Twice per week 10/19/23   [provider]  tamsulosin  (FLOMAX ) 0.4 MG CAPS capsule Take 1 capsule (0.4 mg total) by mouth daily after supper. 11/13/22   Francisca Redell BROCKS, MD    Social History:  reports that he has quit smoking. His smoking use included cigarettes. He has a 70 pack-year smoking history. He has been exposed to tobacco smoke. He has never used smokeless tobacco. He reports that he does not currently use alcohol. He reports that he does not use drugs. Patient lives by himself, and recently transition to hospice given pancreatic cancer.  He is independent at baseline.   Physical Exam: Vitals:   10/06/24  1541 10/06/24 1604  BP:  (!) 176/76  Pulse:  (!) 110  Resp:  18  Temp:  98.5 F (36.9 C)  TempSrc:  Rectal  SpO2:  99%  Weight: 71 kg   Height: 5' 8 (1.727 m)      Gen: No acute distress, sleeping in bed. HENT: NCAT, mouth open CV: Regular rate rhythm Resp: Normal work of breathing MSK: No symmetry Neuro: Does not awaken to voice, appears comfortable   Labs on Admission: I have personally reviewed following labs and imaging studies  CBC: Recent Labs  Lab 10/06/24 1626  WBC 5.8  NEUTROABS 4.0  HGB 11.5*  HCT 36.2*  MCV 93.3  PLT 178   Basic Metabolic Panel: Recent Labs  Lab 10/06/24 1626  NA 138  K 4.8  CL 105  CO2 19*  GLUCOSE 195*  BUN 36*  CREATININE 1.64*  CALCIUM  9.9   GFR: Estimated Creatinine Clearance: 29.5 mL/min (A) (by C-G formula based on SCr of 1.64 mg/dL (H)). Liver Function Tests: Recent Labs  Lab 10/06/24 1626  AST 22  ALT 11  ALKPHOS 53  BILITOT 0.9  PROT 7.2  ALBUMIN 4.3   No results for input(s): LIPASE, AMYLASE in the last 168 hours. Recent Labs  Lab 10/06/24 1626  AMMONIA 17   Coagulation Profile: No results for input(s): INR, PROTIME in the last 168 hours. Cardiac Enzymes: No results for input(s): CKTOTAL, CKMB, CKMBINDEX, TROPONINI, TROPONINIHS in the last 168 hours. BNP (last 3 results) No results for input(s): BNP in the last 8760 hours. HbA1C: No results for input(s): HGBA1C in the last 72 hours. CBG: No results for input(s): GLUCAP in the last 168 hours. Lipid Profile: No results for input(s): CHOL, HDL, LDLCALC, TRIG, CHOLHDL, LDLDIRECT in the last 72 hours. Thyroid  Function Tests: No results for input(s): TSH, T4TOTAL, FREET4, T3FREE, THYROIDAB in the last 72 hours. Anemia Panel: No results for input(s): VITAMINB12, FOLATE, FERRITIN, TIBC, IRON, RETICCTPCT in the last 72 hours. Urine analysis:    Component Value Date/Time   COLORURINE YELLOW (A)  10/06/2024 1627   APPEARANCEUR CLEAR (A) 10/06/2024 1627   APPEARANCEUR Clear 07/14/2022 1259   LABSPEC 1.017 10/06/2024 1627   LABSPEC 1.021 12/09/2011 1539   PHURINE 5.0 10/06/2024 1627   GLUCOSEU NEGATIVE 10/06/2024 1627   GLUCOSEU >=500 12/09/2011 1539   HGBUR SMALL (A) 10/06/2024 1627   BILIRUBINUR NEGATIVE 10/06/2024 1627   BILIRUBINUR Negative 07/14/2022 1259   BILIRUBINUR Negative 12/09/2011 1539   KETONESUR 5 (A) 10/06/2024 1627   PROTEINUR 30 (A) 10/06/2024 1627   NITRITE NEGATIVE 10/06/2024 1627   LEUKOCYTESUR NEGATIVE  10/06/2024 1627   LEUKOCYTESUR Negative 12/09/2011 1539    Radiological Exams on Admission: I have personally reviewed images DG Chest Portable 1 View Result Date: 10/06/2024 CLINICAL DATA:  Altered mental status. EXAM: PORTABLE CHEST 1 VIEW COMPARISON:  Chest radiograph dated 11/02/2023. FINDINGS: Bilateral lower lung field reticulonodular densities may represent atelectasis or atypical infiltrate. No focal consolidation, pleural effusion, pneumothorax. The cardiac silhouette is within normal limits. Atherosclerotic calcification of the aorta. No acute osseous pathology. IMPRESSION: Bilateral lower lung field atelectasis or atypical infiltrate. Electronically Signed   By: Vanetta Chou M.D.   On: 10/06/2024 16:43    EKG: My personal interpretation of EKG shows: Sinus tachycardia, with premature beats but no acute ST changes.    Assessment/Plan Principal Problem:   End of life care Active Problems:   Diabetes mellitus, type II (HCC)   Chronic obstructive pulmonary disease (HCC)   Stage 3b chronic kidney disease (HCC)   Pancreatic cancer [facility])   Patient with recent diagnosis of pancreatic cancer transition to hospice presenting with acute encephalopathy admitted for comfort care.  Family wants to only pursue comfort care which is in line with hospice values.  Order set initiated.  PRNs for pain, agitation, excessive secretions and seizures ordered.   Will consult our hospice team to see if patient can transition to in facility hospice.  Patient did have seizure while in the ED which suggest he may have some acute intracranial process going on which may result in hospital death.  Family is aware and would not want to prolong his suffering.  Discussed overall plan of keeping patient comfortable while he is admitted and transition him to a facility.  Family in agreement.   VTE prophylaxis:  SCDs  Diet: N.p.o. Code Status:  Comfort Care Telemetry:  Admission status: Inpatient, Med-Surg Patient is from: Home Anticipated d/c is to: Home Anticipated d/c is in: 2-3 days   Family Communication: Updated at bedside  Consults called: None   Severity of Illness: The appropriate patient status for this patient is INPATIENT. Inpatient status is judged to be reasonable and necessary in order to provide the required intensity of service to ensure the patient's safety. The patient's presenting symptoms, physical exam findings, and initial radiographic and laboratory data in the context of their chronic comorbidities is felt to place them at high risk for further clinical deterioration. Furthermore, it is not anticipated that the patient will be medically stable for discharge from the hospital within 2 midnights of admission.   * I certify that at the point of admission it is my clinical judgment that the patient will require inpatient hospital care spanning beyond 2 midnights from the point of admission due to high intensity of service, high risk for further deterioration and high frequency of surveillance required.DEWAINE Morene Bathe, MD Jolynn DEL. [facility]

## 2024-10-07 DIAGNOSIS — Z515 Encounter for palliative care: Secondary | ICD-10-CM | POA: Diagnosis not present

## 2024-10-07 LAB — GLUCOSE, CAPILLARY: Glucose-Capillary: 152 mg/dL — ABNORMAL HIGH (ref 70–99)

## 2024-10-07 NOTE — Progress Notes (Signed)
 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  Patient was admitted for acute encephalopathy and terminal agitation with aggression.  Per Dr. Eric Calix, this is a related hospitalization.  Francisco. [place] terminal agitation is being managed by medications at this time.  He is requiring multiple meds to relieve his following symptoms:  SOB, agitation and secretions.  Patient will be transferred to the hospice IPU today.  Collaboration with the hospital and hospice team for discharge.  Patient's RN at [facility] to call report to the IPU at [phone].  I spoke with the patient's Niece, Olam, who confirms that they do not want to escalate Francisco. [facility] hospital care and would like full comfort measures and transition to the hospice IPU for continued symptom management needs and end of life care.  [facility] arranged for pick up by this RN.    Discharge Summary sent to the hospice team.  Please do not hesitate to call with any hospice related questions or concerns.  Saddie HILARIO Na, RN Nurse Liaison [phone]

## 2024-10-07 NOTE — Progress Notes (Signed)
 Report given to Nena Ford RN at hospice house.

## 2024-10-07 NOTE — TOC Transition Note (Signed)
 Transition of Care [facility]) - Discharge Note   Patient Details  Name: Francisco Gibbs MRN: [id number] Date of Birth: [birth year]  [facility]) CM/SW Contact:  Dalia GORMAN Fuse, RN Phone Number: 10/07/2024, 10:27 AM   Clinical Narrative:     Patient is medically clear to discharge to [facility].  No other TOC needs.        Patient Goals and CMS Choice            Discharge Placement                       Discharge Plan and Services Additional resources added to the After Visit Summary for                                       Social Drivers of Health (SDOH) Interventions SDOH Screenings   Food Insecurity: No Food Insecurity (09/05/2024)  Housing: Low Risk  (09/05/2024)  Transportation Needs: No Transportation Needs (09/05/2024)  Utilities: Not At Risk (09/05/2024)  Financial Resource Strain: Low Risk  (06/15/2023)   Received from [facility]  Social Connections: Patient Declined (09/05/2024)  Tobacco Use: Medium Risk (09/05/2024)     Readmission Risk Interventions    11/03/2023    9:46 AM  Readmission Risk Prevention Plan  Transportation Screening Complete  PCP or Specialist Appt within 3-5 Days Complete  HRI or Home Care Consult Complete  Social Work Consult for Recovery Care Planning/Counseling Complete  Palliative Care Screening Not Applicable  Medication Review Oceanographer) Not Complete  Med Review Comments Patient will review medication with MD upon discharge

## 2024-10-07 NOTE — Discharge Summary (Signed)
 Physician Discharge Summary   Patient: Francisco Gibbs MRN: [id number] DOB: 11/16/1933  Admit date:     10/06/2024  Discharge date: 10/07/24  Discharge Physician: AIDA CHO   PCP: Adina Buel HERO, MD   Recommendations at discharge:   Follow-up with hospice team at the hospice house  Discharge Diagnoses: Principal Problem:   End of life care Active Problems:   Diabetes mellitus, type II (HCC)   Chronic obstructive pulmonary disease (HCC)   Stage 3b chronic kidney disease (HCC)   Pancreatic cancer (HCC)  Resolved Problems:   * No resolved hospital problems. *  Hospital Course:  Francisco Gibbs is a 89 y.o. year old male with medical history significant for recent diagnosis of pancreatic mass suspected to be pancreatic cancer, hypertension, hyperlipidemia, type II DM, and on hospice care at home, who was brought to the hospital because of increasing confusion.  He was recently discharged from the hospital on 09/06/2024 to home with hospice based on his preference.  However, he has become increasingly difficult to take care of patient at home because of worsening confusion and combativeness.  Reportedly, he has been refusing medications at home.  It is reported that patient had a seizure in the emergency department on 10/06/2024.  However, family declined further evaluation and preferred to focus on comfort care.  He was evaluated by the hospice team and he is deemed eligible for comfort measures at the hospice house.  He will be discharged to the hospice house today.   I called Ms. Olam Sauers (niece) at [phone] at 11:17 AM today but there was no response.         Consultants: None none Procedures performed: None Disposition: Hospice house Diet recommendation:  NPO   DISCHARGE MEDICATION: Allergies as of 10/07/2024       Reactions   Guaifenesin  Shortness Of Breath   Other reaction(s): Other (See Comments)   Penicillin G Rash, Shortness Of Breath   Has patient had a  PCN reaction causing immediate rash, facial/tongue/throat swelling, SOB or lightheadedness with hypotension: Yes Has patient had a PCN reaction causing severe rash involving mucus membranes or skin necrosis: No Has patient had a PCN reaction that required hospitalization: No Has patient had a PCN reaction occurring within the last 10 years: No If all of the above answers are NO, then may proceed with Cephalosporin use. Has patient had a PCN reaction causing immediate rash, facial/tongue/throat swelling, SOB or lightheadedness with hypotension: Yes Has patient had a PCN reaction causing severe rash involving mucus membranes or skin necrosis: No Has patient had a PCN reaction that required hospitalization: No Has patient had a PCN reaction occurring within the last 10 years: No If all of the above answers are NO, then may proceed with Cephalosporin use. Has patient had a PCN reaction causing immediate rash, facial/tongue/throat swelling, SOB or lightheadedness with hypotension: Yes Has patient had a PCN reaction causing severe rash involving mucus membranes or skin necrosis: No Has patient had a PCN reaction that required hospitalization: No Has patient had a PCN reaction occurring within the last 10 years: No If all of the above answers are NO, then may proceed with Cephalosporin use. Has patient had a PCN reaction causing immediate rash, facial/tongue/throat swelling, SOB or lightheadedness with hypotension: Yes Has patient had a PCN reaction causing severe rash involving mucus membranes or skin necrosis: No Has patient had a PCN reaction that required hospitalization: No Has patient had a PCN reaction occurring within the last  10 years: NoIf all of the above answers are NO, then may proceed with Cephalosporin use. Has patient had a PCN reaction causing immediate rash, facial/tongue/throat swelling, SOB or lightheadedness with hypotension: Yes Has patient had a PCN reaction causing se...  (TRUNCATED)   Sulfa Antibiotics Shortness Of Breath        Medication List     STOP taking these medications    albuterol  108 (90 Base) MCG/ACT inhaler Commonly known as: VENTOLIN  HFA   amLODipine  10 MG tablet Commonly known as: NORVASC    aspirin  81 MG chewable tablet   cetirizine 10 MG tablet Commonly known as: ZYRTEC   famotidine  20 MG tablet Commonly known as: PEPCID    FeroSul 325 (65 Fe) MG tablet Generic drug: ferrous sulfate    finasteride 5 MG tablet Commonly known as: PROSCAR   fluticasone  50 MCG/ACT nasal spray Commonly known as: FLONASE    gabapentin  300 MG capsule Commonly known as: NEURONTIN    hydrALAZINE  100 MG tablet Commonly known as: APRESOLINE    lisinopril  20 MG tablet Commonly known as: ZESTRIL    magnesium  oxide 400 (241.3 Mg) MG tablet Commonly known as: MAG-OX   metFORMIN  500 MG tablet Commonly known as: GLUCOPHAGE    multivitamin with minerals tablet   oxyCODONE  5 MG immediate release tablet Commonly known as: Roxicodone    sertraline  25 MG tablet Commonly known as: ZOLOFT    SPS (Sodium Polystyrene Sulf) 15 GM/60ML suspension Generic drug: sodium polystyrene   tamsulosin  0.4 MG Caps capsule Commonly known as: FLOMAX    Trelegy Ellipta 100-62.5-25 MCG/INH Aepb Generic drug: Fluticasone -Umeclidin-Vilant        Discharge Exam: Filed Weights   10/06/24 1541 10/06/24 2119  Weight: 71 kg 66.3 kg   GEN: NAD SKIN: Warm and dry EYES: No acute abnormality ENT: MMM CV: RRR PULM: CTA B ABD: soft, ND, NT, +BS CNS: Patient appears sedated.  Limited exam because he does not follow commands EXT: No edema or tenderness   Condition at discharge: stable  The results of significant diagnostics from this hospitalization (including imaging, microbiology, ancillary and laboratory) are listed below for reference.   Imaging Studies: DG Chest Portable 1 View Result Date: 10/06/2024 CLINICAL DATA:  Altered mental status. EXAM: PORTABLE  CHEST 1 VIEW COMPARISON:  Chest radiograph dated 11/02/2023. FINDINGS: Bilateral lower lung field reticulonodular densities may represent atelectasis or atypical infiltrate. No focal consolidation, pleural effusion, pneumothorax. The cardiac silhouette is within normal limits. Atherosclerotic calcification of the aorta. No acute osseous pathology. IMPRESSION: Bilateral lower lung field atelectasis or atypical infiltrate. Electronically Signed   By: Vanetta Chou M.D.   On: 10/06/2024 16:43    Microbiology: Results for orders placed or performed during the hospital encounter of 11/02/23  Resp panel by RT-PCR (RSV, Flu A&B, Covid) Anterior Nasal Swab     Status: None   Collection Time: 11/02/23  9:37 PM   Specimen: Anterior Nasal Swab  Result Value Ref Range Status   SARS Coronavirus 2 by RT PCR NEGATIVE NEGATIVE Final    Comment: (NOTE) SARS-CoV-2 target nucleic acids are NOT DETECTED.  The SARS-CoV-2 RNA is generally detectable in upper respiratory specimens during the acute phase of infection. The lowest concentration of SARS-CoV-2 viral copies this assay can detect is 138 copies/mL. A negative result does not preclude SARS-Cov-2 infection and should not be used as the sole basis for treatment or other patient management decisions. A negative result may occur with  improper specimen collection/handling, submission of specimen other than nasopharyngeal swab, presence of viral mutation(s) within the  areas targeted by this assay, and inadequate number of viral copies(<138 copies/mL). A negative result must be combined with clinical observations, patient history, and epidemiological information. The expected result is Negative.  Fact Sheet for Patients:  bloggercourse.com  Fact Sheet for Healthcare Providers:  seriousbroker.it  This test is no t yet approved or cleared by the [place]  FDA and  has been authorized for detection  and/or diagnosis of SARS-CoV-2 by FDA under an Emergency Use Authorization (EUA). This EUA will remain  in effect (meaning this test can be used) for the duration of the COVID-19 declaration under Section 564(b)(1) of the Act, 21 U.S.C.section 360bbb-3(b)(1), unless the authorization is terminated  or revoked sooner.       Influenza A by PCR NEGATIVE NEGATIVE Final   Influenza B by PCR NEGATIVE NEGATIVE Final    Comment: (NOTE) The Xpert Xpress SARS-CoV-2/FLU/RSV plus assay is intended as an aid in the diagnosis of influenza from Nasopharyngeal swab specimens and should not be used as a sole basis for treatment. Nasal washings and aspirates are unacceptable for Xpert Xpress SARS-CoV-2/FLU/RSV testing.  Fact Sheet for Patients: bloggercourse.com  Fact Sheet for Healthcare Providers: seriousbroker.it  This test is not yet approved or cleared by the [place]  FDA and has been authorized for detection and/or diagnosis of SARS-CoV-2 by FDA under an Emergency Use Authorization (EUA). This EUA will remain in effect (meaning this test can be used) for the duration of the COVID-19 declaration under Section 564(b)(1) of the Act, 21 U.S.C. section 360bbb-3(b)(1), unless the authorization is terminated or revoked.     Resp Syncytial Virus by PCR NEGATIVE NEGATIVE Final    Comment: (NOTE) Fact Sheet for Patients: bloggercourse.com  Fact Sheet for Healthcare Providers: seriousbroker.it  This test is not yet approved or cleared by the [place]  FDA and has been authorized for detection and/or diagnosis of SARS-CoV-2 by FDA under an Emergency Use Authorization (EUA). This EUA will remain in effect (meaning this test can be used) for the duration of the COVID-19 declaration under Section 564(b)(1) of the Act, 21 U.S.C. section 360bbb-3(b)(1), unless the authorization is terminated  or revoked.  Performed at [facility], [address] [place], [place] [address]   Blood Culture (routine x 2)     Status: None   Collection Time: 11/02/23  9:37 PM   Specimen: BLOOD  Result Value Ref Range Status   Specimen Description BLOOD BLOOD LEFT ARM  Final   Special Requests   Final    BOTTLES DRAWN AEROBIC AND ANAEROBIC Blood Culture adequate volume   Culture   Final    NO GROWTH 5 DAYS Performed at [facility], [address] [place], [place] [address]    Report Status 11/07/2023 FINAL  Final  Blood Culture (routine x 2)     Status: None   Collection Time: 11/02/23  9:37 PM   Specimen: BLOOD  Result Value Ref Range Status   Specimen Description BLOOD BLOOD RIGHT ARM  Final   Special Requests   Final    BOTTLES DRAWN AEROBIC AND ANAEROBIC Blood Culture adequate volume   Culture   Final    NO GROWTH 5 DAYS Performed at [facility], [address] [place], [place] [address]    Report Status 11/07/2023 FINAL  Final    Labs: CBC: Recent Labs  Lab 10/06/24 1626  WBC 5.8  NEUTROABS 4.0  HGB 11.5*  HCT 36.2*  MCV 93.3  PLT 178   Basic Metabolic  Panel: Recent Labs  Lab 10/06/24 1626  NA 138  K 4.8  CL 105  CO2 19*  GLUCOSE 195*  BUN 36*  CREATININE 1.64*  CALCIUM  9.9   Liver Function Tests: Recent Labs  Lab 10/06/24 1626  AST 22  ALT 11  ALKPHOS 53  BILITOT 0.9  PROT 7.2  ALBUMIN 4.3   CBG: Recent Labs  Lab 10/07/24 0742  GLUCAP 152*    Discharge time spent: greater than 30 minutes.  Signed: AIDA CHO, MD [facility] 10/07/2024

## 2024-10-07 NOTE — Plan of Care (Signed)
  Problem: Coping: Goal: Ability to identify and develop effective coping behavior will improve Outcome: Progressing   Problem: Respiratory: Goal: Verbalizations of increased ease of respirations will increase Outcome: Progressing   Problem: Pain Management: Goal: Satisfaction with pain management regimen will improve Outcome: Progressing   Problem: Coping: Goal: Level of anxiety will decrease Outcome: Progressing

## 2024-10-31 DEATH — deceased
